# Patient Record
Sex: Female | Born: 1960 | Race: Black or African American | Hispanic: No | Marital: Single | Smoking: Former smoker
Health system: Southern US, Community
[De-identification: ages and names within clinical notes are randomized; demographics above are authoritative.]

## PROBLEM LIST (undated history)

## (undated) DIAGNOSIS — D281 Benign neoplasm of vagina: Secondary | ICD-10-CM

## (undated) DIAGNOSIS — K921 Melena: Secondary | ICD-10-CM

## (undated) DIAGNOSIS — E785 Hyperlipidemia, unspecified: Secondary | ICD-10-CM

## (undated) HISTORY — DX: Hyperlipidemia, unspecified: E78.5

## (undated) HISTORY — DX: Melena: K92.1

## (undated) HISTORY — DX: Benign neoplasm of vagina: D28.1

---

## 2003-11-18 ENCOUNTER — Other Ambulatory Visit: Admission: RE | Admit: 2003-11-18 | Discharge: 2003-11-18 | Payer: Self-pay | Admitting: Gynecology

## 2005-06-21 ENCOUNTER — Ambulatory Visit: Payer: Self-pay | Admitting: Gastroenterology

## 2005-08-22 ENCOUNTER — Ambulatory Visit: Payer: Self-pay | Admitting: Gastroenterology

## 2005-08-28 ENCOUNTER — Ambulatory Visit: Payer: Self-pay | Admitting: Gastroenterology

## 2006-10-09 ENCOUNTER — Other Ambulatory Visit: Admission: RE | Admit: 2006-10-09 | Discharge: 2006-10-09 | Payer: Self-pay | Admitting: Obstetrics and Gynecology

## 2006-11-21 ENCOUNTER — Ambulatory Visit (HOSPITAL_COMMUNITY): Admission: RE | Admit: 2006-11-21 | Discharge: 2006-11-21 | Payer: Self-pay | Admitting: Obstetrics and Gynecology

## 2006-11-21 ENCOUNTER — Encounter (INDEPENDENT_AMBULATORY_CARE_PROVIDER_SITE_OTHER): Payer: Self-pay | Admitting: Specialist

## 2007-02-06 ENCOUNTER — Other Ambulatory Visit: Admission: RE | Admit: 2007-02-06 | Discharge: 2007-02-06 | Payer: Self-pay | Admitting: Obstetrics and Gynecology

## 2007-05-02 ENCOUNTER — Emergency Department (HOSPITAL_COMMUNITY): Admission: EM | Admit: 2007-05-02 | Discharge: 2007-05-02 | Payer: Self-pay | Admitting: Emergency Medicine

## 2007-08-21 ENCOUNTER — Other Ambulatory Visit: Admission: RE | Admit: 2007-08-21 | Discharge: 2007-08-21 | Payer: Self-pay | Admitting: Gynecology

## 2007-09-11 ENCOUNTER — Ambulatory Visit (HOSPITAL_COMMUNITY): Admission: RE | Admit: 2007-09-11 | Discharge: 2007-09-11 | Payer: Self-pay | Admitting: Gynecology

## 2009-07-15 LAB — HM MAMMOGRAPHY

## 2010-08-22 ENCOUNTER — Encounter: Payer: Managed Care, Other (non HMO) | Admitting: Gynecology

## 2010-08-22 ENCOUNTER — Other Ambulatory Visit (HOSPITAL_COMMUNITY)
Admission: RE | Admit: 2010-08-22 | Discharge: 2010-08-22 | Disposition: A | Discharge status: Home / Self Care | Payer: Managed Care, Other (non HMO) | Source: Ambulatory Visit | Attending: Gynecology | Admitting: Gynecology

## 2010-08-22 ENCOUNTER — Other Ambulatory Visit: Payer: Self-pay | Admitting: Gynecology

## 2010-08-22 DIAGNOSIS — Z01419 Encounter for gynecological examination (general) (routine) without abnormal findings: Secondary | ICD-10-CM

## 2010-08-22 DIAGNOSIS — Z124 Encounter for screening for malignant neoplasm of cervix: Secondary | ICD-10-CM | POA: Insufficient documentation

## 2010-08-22 DIAGNOSIS — N912 Amenorrhea, unspecified: Secondary | ICD-10-CM

## 2010-08-24 ENCOUNTER — Other Ambulatory Visit: Payer: Managed Care, Other (non HMO)

## 2010-08-24 ENCOUNTER — Ambulatory Visit: Payer: Managed Care, Other (non HMO) | Admitting: Gynecology

## 2010-08-24 DIAGNOSIS — D259 Leiomyoma of uterus, unspecified: Secondary | ICD-10-CM

## 2010-08-24 DIAGNOSIS — N912 Amenorrhea, unspecified: Secondary | ICD-10-CM

## 2010-08-24 DIAGNOSIS — N949 Unspecified condition associated with female genital organs and menstrual cycle: Secondary | ICD-10-CM

## 2010-11-30 NOTE — Op Note (Signed)
[name], DERSTINE                 ACCOUNT NO.:  [id number]   MEDICAL RECORD NO.:  [id number]          PATIENT TYPE:  AMB   LOCATION:  SDC                           FACILITY:  [facility]   PHYSICIAN:  James A. Ashley Royalty, M.D.DATE OF BIRTH:  [birth year]   DATE OF PROCEDURE:  DATE OF DISCHARGE:                               OPERATIVE REPORT   PREOPERATIVE DIAGNOSIS:  Menorrhagia.   POSTOPERATIVE DIAGNOSIS:  Cervical polyp versus fibroid.   PROCEDURES:  1. Diagnostic/operative hysteroscopy with removal of polyp versus      fibroid.  2. Dilatation and curettage.   SURGEON:  Rudy Jew. Ashley Royalty, M.D.   ANESTHESIA:  General with 1% Xylocaine paracervical block.   SPECIMENS:  Cervical polyp versus fibroid.   ESTIMATED BLOOD LOSS:  50 mL.   DEFICIT:  300 mL with a large amount on the floor.   COMPLICATIONS:  None.   PACKS AND DRAINS:  None.   PROCEDURE:  The patient was taken to the operating room and placed in  the dorsal supine position.  After a general anesthetic was  administered, she was placed in the lithotomy position and prepped and  draped in the usual manner for vaginal surgery.  A posterior weighted  retractor was placed per vagina.  The anterior lip of the cervix was  grasped with a single-tooth tenaculum.  The uterus was gently sounded to  approximately 11 cm.  The cervix was dilated to a size 27 [place] using  [employer] dilators.  The resectoscope was placed into the uterine cavity  using sorbitol as a distention medium.  The endometrial cavity was  thoroughly inspected.  The left and right tubal ostia were seen without  difficulty.  Appropriate photos were obtained.  The endometrial cavity  itself was completely unremarkable.  I could appreciate no submucosal  fibroids or polyps whatsoever.  However, retracting the scope into the  cervix did reveal the presence of an anterior pedunculated polyp versus  fibroid.  The appearance favored that of a polyp.  Using the  resectoscope  and 60 watts of power, cutting waveform, the polyp was  resected from its origin and submitted to pathology for histologic  studies.  Hemostasis was obtained using the coagulation waveform.  Hemostasis was noted.   Attention was then turned to the uterine curettage.  A medium-sized  curette was introduced into the uterine cavity and 4-quadrant curettage  was performed.  Then a therapeutic curettage was performed.  All  curettings were separately submitted to pathology for histologic  studies.   At this point the patient was felt to have benefitted maximally from the  surgical procedure.  Xylocaine 1% paracervical block was placed for  postoperative analgesia.  The vaginal instruments were removed and the  procedure terminated.   The patient tolerated the procedure extremely well and was returned to  the recovery room in good condition.      James A. Ashley Royalty, M.D.  Electronically Signed     JAM/MEDQ  D:  11/21/2006  T:  11/21/2006  Job:  [id number]

## 2010-11-30 NOTE — H&P (Signed)
[name], [name]                 ACCOUNT NO.:  [id number]   MEDICAL RECORD NO.:  [id number]          PATIENT TYPE:  AMB   LOCATION:  SDC                           FACILITY:  [facility]   PHYSICIAN:  James A. Ashley Royalty, M.D.DATE OF BIRTH:  08/09/1960   DATE OF ADMISSION:  11/21/2006  DATE OF DISCHARGE:                              HISTORY & PHYSICAL   A 50 year old nulligravida referred for evaluation of significant anemia  and abnormal uterine bleeding.  The patient stated her periods were  approximately every month lasting 6 days and quite heavy.  She states  she has a history of a fibroid uterus.  Initial pelvic examination  revealed the uterus to be approximately 12 x 8 x 8 cm and no adnexal  masses were palpable.  Subsequent ultrasound was performed and the  uterus was noted to be 12 x 9 x 8 cm with several fibroids present, the  largest of which is 6 cm in greatest diameter.  In addition, there was a  2.5 cm cyst in the left adnexa.  CBC was performed and the hemoglobin  was noted to be 7.8.  The patient expressed a desire to preserve her  childbearing capabilities.  As she has [employer], sonohysterogram  in the office is not possible since it is not covered by her insurance.  She presents for diagnostic/operative hysteroscopy and dilatation and  curettage.   MEDICATIONS:  See intake sheet.   PAST MEDICAL HISTORY:  Medical negative.  Surgical negative.   ALLERGIES:  None.   SOCIAL HISTORY:  The patient denies she uses tobacco or significant  alcohol.   REVIEW OF SYSTEMS:  Not attributable.   PHYSICAL EXAMINATION:  GENERAL:  Well-developed, well-nourished pleasant  female in no acute distress.  Afebrile with vital signs stable.  CHEST:  Lungs are clear.  CARDIAC:  Regular rate and rhythm.  ABDOMEN:  Soft and nontender.  PELVIC:  Please most recent office evaluation.  External genitalia  within normal limits.  Vagina and cervix are without gross lesions.  Bimanual  examination reveals the uterus to be approximately 12 x 8 x 8  cm and no adnexal masses are palpable.   IMPRESSION:  1. Fibroid uterus.  2. Menorrhagia, probably secondary to #1.  3. Iron deficiency anemia, probably secondary to #2.  4. Left adnexal cyst.  Differential includes endometriosis, benign      neoplasm, doubt malignant neoplasm.   PLAN:  1. Diagnostic/operative hysteroscopy.  2. Dilatation and curettage.   Risks, benefits, complications and alternatives were discussed with the  patient.  She states she understands and accepts.  Questions invited and  answered.      James A. Ashley Royalty, M.D.  Electronically Signed     JAM/MEDQ  D:  11/21/2006  T:  11/21/2006  Job:  [id number]

## 2012-06-17 ENCOUNTER — Other Ambulatory Visit: Payer: Self-pay | Admitting: *Deleted

## 2012-06-17 ENCOUNTER — Other Ambulatory Visit (INDEPENDENT_AMBULATORY_CARE_PROVIDER_SITE_OTHER): Payer: Managed Care, Other (non HMO)

## 2012-06-17 ENCOUNTER — Encounter: Payer: Self-pay | Admitting: Internal Medicine

## 2012-06-17 ENCOUNTER — Ambulatory Visit (INDEPENDENT_AMBULATORY_CARE_PROVIDER_SITE_OTHER): Payer: Managed Care, Other (non HMO) | Admitting: Internal Medicine

## 2012-06-17 VITALS — BP 116/82 | HR 65 | Temp 98.0°F | Ht 63.0 in | Wt 181.0 lb

## 2012-06-17 DIAGNOSIS — Z13220 Encounter for screening for lipoid disorders: Secondary | ICD-10-CM

## 2012-06-17 DIAGNOSIS — Z130 Encounter for screening for diseases of the blood and blood-forming organs and certain disorders involving the immune mechanism: Secondary | ICD-10-CM

## 2012-06-17 DIAGNOSIS — Z131 Encounter for screening for diabetes mellitus: Secondary | ICD-10-CM

## 2012-06-17 DIAGNOSIS — Z1231 Encounter for screening mammogram for malignant neoplasm of breast: Secondary | ICD-10-CM

## 2012-06-17 DIAGNOSIS — R5383 Other fatigue: Secondary | ICD-10-CM

## 2012-06-17 DIAGNOSIS — Z23 Encounter for immunization: Secondary | ICD-10-CM

## 2012-06-17 DIAGNOSIS — R5381 Other malaise: Secondary | ICD-10-CM

## 2012-06-17 DIAGNOSIS — Z1321 Encounter for screening for nutritional disorder: Secondary | ICD-10-CM

## 2012-06-17 DIAGNOSIS — Z1329 Encounter for screening for other suspected endocrine disorder: Secondary | ICD-10-CM

## 2012-06-17 DIAGNOSIS — Z0000 Encounter for general adult medical examination without abnormal findings: Secondary | ICD-10-CM

## 2012-06-17 DIAGNOSIS — Z1239 Encounter for other screening for malignant neoplasm of breast: Secondary | ICD-10-CM

## 2012-06-17 LAB — TSH: TSH: 3.77 u[IU]/mL (ref 0.35–5.50)

## 2012-06-17 LAB — BASIC METABOLIC PANEL
BUN: 15 mg/dL (ref 6–23)
CO2: 26 mEq/L (ref 19–32)
GFR: 118.18 mL/min (ref >60.00)
Glucose, Bld: 84 mg/dL (ref 70–99)
Potassium: 4.1 mEq/L (ref 3.5–5.1)

## 2012-06-17 LAB — LIPID PANEL
Cholesterol: 250 mg/dL — ABNORMAL HIGH (ref 0–200)
HDL: 38.1 mg/dL — ABNORMAL LOW (ref >39.00)
Triglycerides: 105 mg/dL (ref 0.0–149.0)

## 2012-06-17 LAB — CBC
MCHC: 33.1 g/dL (ref 30.0–36.0)
MCV: 91.7 fl (ref 78.0–100.0)
RBC: 4.39 Mil/uL (ref 3.87–5.11)
WBC: 6.4 10*3/uL (ref 4.5–10.5)

## 2012-06-17 NOTE — Patient Instructions (Addendum)
Health Maintenance, Females A healthy lifestyle and preventative care can promote health and wellness.  Maintain regular health, dental, and eye exams.  Eat a healthy diet. Foods like vegetables, fruits, whole grains, low-fat dairy products, and lean protein foods contain the nutrients you need without too many calories. Decrease your intake of foods high in solid fats, added sugars, and salt. Get information about a proper diet from your caregiver, if necessary.  Regular physical exercise is one of the most important things you can do for your health. Most adults should get at least 150 minutes of moderate-intensity exercise (any activity that increases your heart rate and causes you to sweat) each week. In addition, most adults need muscle-strengthening exercises on 2 or more days a week.   Maintain a healthy weight. The body mass index (BMI) is a screening tool to identify possible weight problems. It provides an estimate of body fat based on height and weight. Your caregiver can help determine your BMI, and can help you achieve or maintain a healthy weight. For adults 20 years and older:  A BMI below 18.5 is considered underweight.  A BMI of 18.5 to 24.9 is normal.  A BMI of 25 to 29.9 is considered overweight.  A BMI of 30 and above is considered obese.  Maintain normal blood lipids and cholesterol by exercising and minimizing your intake of saturated fat. Eat a balanced diet with plenty of fruits and vegetables. Blood tests for lipids and cholesterol should begin at age 20 and be repeated every 5 years. If your lipid or cholesterol levels are high, you are over 50, or you are a high risk for heart disease, you may need your cholesterol levels checked more frequently.Ongoing high lipid and cholesterol levels should be treated with medicines if diet and exercise are not effective.  If you smoke, find out from your caregiver how to quit. If you do not use tobacco, do not start.  If you  are pregnant, do not drink alcohol. If you are breastfeeding, be very cautious about drinking alcohol. If you are not pregnant and choose to drink alcohol, do not exceed 1 drink per day. One drink is considered to be 12 ounces (355 mL) of beer, 5 ounces (148 mL) of wine, or 1.5 ounces (44 mL) of liquor.  Avoid use of street drugs. Do not share needles with anyone. Ask for help if you need support or instructions about stopping the use of drugs.  High blood pressure causes heart disease and increases the risk of stroke. Blood pressure should be checked at least every 1 to 2 years. Ongoing high blood pressure should be treated with medicines, if weight loss and exercise are not effective.  If you are 55 to 49 years old, ask your caregiver if you should take aspirin to prevent strokes.  Diabetes screening involves taking a blood sample to check your fasting blood sugar level. This should be done once every 3 years, after age 45, if you are within normal weight and without risk factors for diabetes. Testing should be considered at a younger age or be carried out more frequently if you are overweight and have at least 1 risk factor for diabetes.  Breast cancer screening is essential preventative care for women. You should practice "breast self-awareness." This means understanding the normal appearance and feel of your breasts and may include breast self-examination. Any changes detected, no matter how small, should be reported to a caregiver. Women in their 20s and 30s should have   a clinical breast exam (CBE) by a caregiver as part of a regular health exam every 1 to 3 years. After age 40, women should have a CBE every year. Starting at age 40, women should consider having a mammogram (breast X-ray) every year. Women who have a family history of breast cancer should talk to their caregiver about genetic screening. Women at a high risk of breast cancer should talk to their caregiver about having an MRI and a  mammogram every year.  The Pap test is a screening test for cervical cancer. Women should have a Pap test starting at age 21. Between ages 21 and 29, Pap tests should be repeated every 2 years. Beginning at age 30, you should have a Pap test every 3 years as long as the past 3 Pap tests have been normal. If you had a hysterectomy for a problem that was not cancer or a condition that could lead to cancer, then you no longer need Pap tests. If you are between ages 65 and 70, and you have had normal Pap tests going back 10 years, you no longer need Pap tests. If you have had past treatment for cervical cancer or a condition that could lead to cancer, you need Pap tests and screening for cancer for at least 20 years after your treatment. If Pap tests have been discontinued, risk factors (such as a new sexual partner) need to be reassessed to determine if screening should be resumed. Some women have medical problems that increase the chance of getting cervical cancer. In these cases, your caregiver may recommend more frequent screening and Pap tests.  The human papillomavirus (HPV) test is an additional test that may be used for cervical cancer screening. The HPV test looks for the virus that can cause the cell changes on the cervix. The cells collected during the Pap test can be tested for HPV. The HPV test could be used to screen women aged 30 years and older, and should be used in women of any age who have unclear Pap test results. After the age of 30, women should have HPV testing at the same frequency as a Pap test.  Colorectal cancer can be detected and often prevented. Most routine colorectal cancer screening begins at the age of 50 and continues through age 75. However, your caregiver may recommend screening at an earlier age if you have risk factors for colon cancer. On a yearly basis, your caregiver may provide home test kits to check for hidden blood in the stool. Use of a small camera at the end of a  tube, to directly examine the colon (sigmoidoscopy or colonoscopy), can detect the earliest forms of colorectal cancer. Talk to your caregiver about this at age 50, when routine screening begins. Direct examination of the colon should be repeated every 5 to 10 years through age 75, unless early forms of pre-cancerous polyps or small growths are found.  Hepatitis C blood testing is recommended for all people born from 1945 through 1965 and any individual with known risks for hepatitis C.  Practice safe sex. Use condoms and avoid high-risk sexual practices to reduce the spread of sexually transmitted infections (STIs). Sexually active women aged 25 and younger should be checked for Chlamydia, which is a common sexually transmitted infection. Older women with new or multiple partners should also be tested for Chlamydia. Testing for other STIs is recommended if you are sexually active and at increased risk.  Osteoporosis is a disease in which the   bones lose minerals and strength with aging. This can result in serious bone fractures. The risk of osteoporosis can be identified using a bone density scan. Women ages 65 and over and women at risk for fractures or osteoporosis should discuss screening with their caregivers. Ask your caregiver whether you should be taking a calcium supplement or vitamin D to reduce the rate of osteoporosis.  Menopause can be associated with physical symptoms and risks. Hormone replacement therapy is available to decrease symptoms and risks. You should talk to your caregiver about whether hormone replacement therapy is right for you.  Use sunscreen with a sun protection factor (SPF) of 30 or greater. Apply sunscreen liberally and repeatedly throughout the day. You should seek shade when your shadow is shorter than you. Protect yourself by wearing long sleeves, pants, a wide-brimmed hat, and sunglasses year round, whenever you are outdoors.  Notify your caregiver of new moles or  changes in moles, especially if there is a change in shape or color. Also notify your caregiver if a mole is larger than the size of a pencil eraser.  Stay current with your immunizations. Document Released: 01/14/2011 Document Revised: 09/23/2011 Document Reviewed: 01/14/2011 ExitCare Patient Information 2013 [employer]. Breast Self-Exam A self breast exam may help you find changes or problems while they are still small. Do a breast self-exam:  Every month.  One week after your period (menstrual period).  On the first day of each month if you do not have periods anymore. Look for any:  Change in breast color, size, or shape.  Dimples in your breast.  Changes in your nipples or skin.  Dry skin on your breasts or nipples.  Watery or bloody discharge from your nipples.  Feel for:  Lumps.  Thick, hard places.  Any other changes. HOME CARE There are 3 ways to do the breast self-exam: In front of a mirror.  Lift your arms over your head and turn side to side.  Put your hands on your hips and lean down, then turn from side to side.  Bend forward and turn from side to side. In the shower.  With soapy hands, check both breasts. Then check above and below your collarbone and your armpits.  Feel above and below your collarbone down to under your breast, and from the center of your chest to the outer edge of the armpit. Check for any lumps or hard spots.  Using the tips of your middle three fingers check your whole breast by pressing your hand over your breast in a circle or in an up and down motion. Lying down.  Lie flat on your bed.  Put a small pillow under the breast you are going to check. On that same side, put your hand behind your head.  With your other hand, use the 3 middle fingers to feel the breast.  Move your fingers in a circle around the breast. Press firmly over all parts of the breast to feel for any lumps. GET HELP RIGHT AWAY IF: You find any  changes in your breasts so they can be checked. Document Released: 12/18/2007 Document Revised: 09/23/2011 Document Reviewed: 10/19/2008 ExitCare Patient Information 2013 [employer].  

## 2012-06-17 NOTE — Addendum Note (Signed)
Addended by: Brenton Grills C on: 06/17/2012 11:23 AM   Modules accepted: Orders

## 2012-06-17 NOTE — Progress Notes (Signed)
HPI  Pt present to the clinic today to establish care. She has no concerns.  Past Medical History  Diagnosis Date  . Hyperlipidemia   . Blood in stool   . Vaginal fibroids     Current Outpatient Prescriptions  Medication Sig Dispense Refill  . tobramycin-dexamethasone (TOBRADEX) ophthalmic solution         No Known Allergies  Family History  Problem Relation Age of Onset  . Diabetes Father   . Hypertension Father   . Heart disease Father   . Hyperlipidemia Father   . Stroke Father   . Cancer Neg Hx     History   Social History  . Marital Status: Single    Spouse Name: N/A    Number of Children: N/A  . Years of Education: 12   Occupational History  .     Social History Main Topics  . Smoking status: Former Games developer  . Smokeless tobacco: Not on file  . Alcohol Use: No  . Drug Use: No  . Sexually Active: Yes    Birth Control/ Protection: Condom   Other Topics Concern  . Not on file   Social History Narrative   Regular exercise-yesCaffeine Use-yes    ROS:  Constitutional: Pt reports fatigue. Denies fever, malaise,headache or abrupt weight changes.  HEENT: Denies eye pain, eye redness, ear pain, ringing in the ears, wax buildup, runny nose, nasal congestion, bloody nose, or sore throat. Respiratory: Denies difficulty breathing, shortness of breath, cough or sputum production.   Cardiovascular: Denies chest pain, chest tightness, palpitations or swelling in the hands or feet.  Gastrointestinal: Denies abdominal pain, bloating, constipation, diarrhea or blood in the stool.  GU: Denies frequency, urgency, pain with urination, blood in urine, odor or discharge. Musculoskeletal: Denies decrease in range of motion, difficulty with gait, muscle pain or joint pain and swelling.  Skin: Denies redness, rashes, lesions or ulcercations.  Neurological: Denies dizziness, difficulty with memory, difficulty with speech or problems with balance and coordination.   No other  specific complaints in a complete review of systems (except as listed in HPI above).  PE:  BP 116/82  Pulse 65  Temp 98 F (36.7 C) (Oral)  Ht 5\' 3"  (1.6 m)  Wt 181 lb (82.101 kg)  BMI 32.06 kg/m2  SpO2 95%  LMP 07/15/2009 Wt Readings from Last 3 Encounters:  06/17/12 181 lb (82.101 kg)    General: Appears her stated age, overweight but well developed, well nourished in NAD. HEENT: Head: normal shape and size; Eyes: sclera white, no icterus, conjunctiva pink, PERRLA and EOMs intact; Ears: Tm's gray and intact, normal light reflex; Nose: mucosa pink and moist, septum midline; Throat/Mouth: Teeth present, mucosa pink and moist, no lesions or ulcerations noted.  Neck: Normal range of motion. Neck supple, trachea midline. No massses, lumps or thyromegaly present.  Cardiovascular: Normal rate and rhythm. S1,S2 noted.  No murmur, rubs or gallops noted. No JVD or BLE edema. No carotid bruits noted. Pulmonary/Chest: Normal effort and positive vesicular breath sounds. No respiratory distress. No wheezes, rales or ronchi noted.  Abdomen: Soft and nontender. Normal bowel sounds, no bruits noted. No distention or masses noted. Liver, spleen and kidneys non palpable. Musculoskeletal: Normal range of motion. No signs of joint swelling. No difficulty with gait.  Neurological: Alert and oriented. Cranial nerves II-XII intact. Coordination normal. +DTRs bilaterally. Psychiatric: Mood and affect normal. Behavior is normal. Judgment and thought content normal.   Assessment and Plan:  Preventative Health Maintenance:  Will  obtain basic screening labs today: CBC, BMET, TSH, lipids and vit D Will order mammogram Will get a tdap today Pt declines flu vaccine  RTC in 1 year or sooner if needed.

## 2012-06-18 ENCOUNTER — Telehealth: Payer: Self-pay | Admitting: *Deleted

## 2012-06-18 ENCOUNTER — Other Ambulatory Visit: Payer: Self-pay | Admitting: Internal Medicine

## 2012-06-18 ENCOUNTER — Encounter: Payer: Self-pay | Admitting: Internal Medicine

## 2012-06-18 DIAGNOSIS — E559 Vitamin D deficiency, unspecified: Secondary | ICD-10-CM

## 2012-06-18 MED ORDER — ERGOCALCIFEROL 1.25 MG (50000 UT) PO CAPS: 50000.0000 [IU] | ORAL_CAPSULE | ORAL | Status: DC

## 2012-06-18 NOTE — Telephone Encounter (Signed)
Pt informed of lab results and of NP's advisement.   Ash,  Can you please send this and also give Ms. Wisser a call and let her know that:  Your complete blood count is essentially normal.  Your basic metabolic panel, a lab that looks at your electrolytes as well as your kidney function, are all essentially normal.  Your cholesterol is very elevated. I suggest 3 months of lifestyle changes, including diet and exercise changes. I would avoid fast food and fried foods and try to e xercise for at least 30 minutes 4 days out of the week.  Your TSH, a measure of your thyroid function, is essentially normal  Your vitamin D level came back low. The normal range is between 30-100. I have called in a prescription for Ergocalciferol 50,000 units to be taken once a week for the next 12 weeks. When you complete this, I ask that you come back to the office for a follow-up of your vitamin D level. Thx!  Rene Kocher

## 2012-06-18 NOTE — Progress Notes (Signed)
Vit d level low. Order for ergocalciferol placed

## 2012-07-14 ENCOUNTER — Ambulatory Visit (HOSPITAL_COMMUNITY): Admission: RE | Admit: 2012-07-14 | Payer: Managed Care, Other (non HMO) | Source: Ambulatory Visit

## 2013-06-21 ENCOUNTER — Ambulatory Visit (INDEPENDENT_AMBULATORY_CARE_PROVIDER_SITE_OTHER): Payer: Managed Care, Other (non HMO) | Admitting: Nurse Practitioner

## 2013-06-21 ENCOUNTER — Telehealth: Payer: Self-pay | Admitting: *Deleted

## 2013-06-21 ENCOUNTER — Encounter: Payer: Self-pay | Admitting: Nurse Practitioner

## 2013-06-21 ENCOUNTER — Ambulatory Visit: Payer: Managed Care, Other (non HMO)

## 2013-06-21 VITALS — BP 130/80 | HR 85 | Temp 98.6°F | Ht 63.0 in | Wt 170.0 lb

## 2013-06-21 DIAGNOSIS — J069 Acute upper respiratory infection, unspecified: Secondary | ICD-10-CM

## 2013-06-21 DIAGNOSIS — N390 Urinary tract infection, site not specified: Secondary | ICD-10-CM

## 2013-06-21 DIAGNOSIS — R109 Unspecified abdominal pain: Secondary | ICD-10-CM

## 2013-06-21 LAB — POCT URINALYSIS DIPSTICK
Ketones, UA: NEGATIVE
Spec Grav, UA: 1.015
Urobilinogen, UA: 0.2
pH, UA: 6.5

## 2013-06-21 MED ORDER — CIPROFLOXACIN HCL 250 MG PO TABS: 250.0000 mg | ORAL_TABLET | Freq: Two times a day (BID) | ORAL | Status: DC

## 2013-06-21 MED ORDER — PHENAZOPYRIDINE HCL 200 MG PO TABS: 200.0000 mg | ORAL_TABLET | Freq: Three times a day (TID) | ORAL | Status: DC | PRN

## 2013-06-21 MED ORDER — NITROFURANTOIN MONOHYD MACRO 100 MG PO CAPS: 100.0000 mg | ORAL_CAPSULE | Freq: Two times a day (BID) | ORAL | Status: DC

## 2013-06-21 NOTE — Progress Notes (Signed)
   Subjective:    Patient ID: [name], female    DOB: [birth year], 52 y.o.   MRN: [id number]  Flank Pain This is a new problem. The current episode started in the past 7 days (3da). The problem occurs constantly. Pain location: R side. The quality of the pain is described as aching. The pain does not radiate. The pain is mild. Associated symptoms include abdominal pain. Pertinent negatives include no bladder incontinence, bowel incontinence, chest pain, dysuria, fever, headaches, leg pain, pelvic pain or weakness. (Malodorous urine. Reports URI for several days, getting better. ) She has tried nothing for the symptoms.      Review of Systems  Constitutional: Negative for fever, chills and fatigue.  HENT: Positive for congestion. Negative for ear pain.   Eyes: Negative for redness.  Respiratory: Positive for cough. Negative for chest tightness, shortness of breath and wheezing.   Cardiovascular: Negative for chest pain.  Gastrointestinal: Positive for abdominal pain and constipation (has bm every 3-4 days). Negative for nausea, vomiting, diarrhea, abdominal distention and bowel incontinence.  Genitourinary: Positive for flank pain (Right) and difficulty urinating (when bladder is overdistended). Negative for bladder incontinence, dysuria, urgency, menstrual problem (menopausal for 4 years) and pelvic pain.  Neurological: Negative for weakness and headaches.  Hematological: Negative for adenopathy.       Objective:   Physical Exam  Vitals reviewed. Constitutional: She is oriented to person, place, and time. She appears well-developed and well-nourished. No distress.  HENT:  Head: Normocephalic and atraumatic.  Right Ear: External ear normal. Tympanic membrane is not retracted and not bulging. A middle ear effusion is present.  Left Ear: External ear normal. Tympanic membrane is not retracted and not bulging.  No middle ear effusion.  Wearing upper denture  Eyes: Right eye exhibits  no discharge. Left eye exhibits no discharge.  Sclera injected  Neck: Normal range of motion. Neck supple. No thyromegaly present.  Cardiovascular: Normal rate, regular rhythm and normal heart sounds.   No murmur heard. Pulmonary/Chest: Effort normal and breath sounds normal. No respiratory distress. She has no wheezes.  Abdominal: Soft. Bowel sounds are normal. She exhibits no distension and no mass. There is no tenderness. There is no rebound and no guarding.  Musculoskeletal: She exhibits tenderness.  R-sided CVA tenderness  Lymphadenopathy:    She has no cervical adenopathy.  Neurological: She is alert and oriented to person, place, and time.  Skin: Skin is warm and dry.  Psychiatric: She has a normal mood and affect. Her behavior is normal. Thought content normal.          Assessment & Plan:  1. Side pain, right - POCT urinalysis dipstick= pos blood, protein, leuks - Urine culture - Urinalysis, Routine w reflex microscopic; Future  2. Urinary tract infection  - phenazopyridine (PYRIDIUM) 200 MG tablet; Take 1 tablet (200 mg total) by mouth 3 (three) times daily as needed for pain.  Dispense: 10 tablet; Refill: 0 - Urinalysis, Routine w reflex microscopic; Future - ciprofloxacin (CIPRO) 250 MG tablet; Take 1 tablet (250 mg total) by mouth 2 (two) times daily.  Dispense: 6 tablet; Refill: 0  3. URI See pt instructions.

## 2013-06-21 NOTE — Patient Instructions (Addendum)
I am treating you for a urinary tract infection. Please start antibiotic. Also start drinking club soda and cranberry juice to stay hydrated-sip through out the day for 3-4 days. Avoid dark sodas. Use yridium to relax bladder, this may help with pain in side. Please let us know if pain in side does not improve in 3-4 days, or sooner if you feel worse. Please follow up in 1 month for re-check of urine, as you have protein & blood in urine today. For fluid in ears, take 30 mg pseudoephedrine twice daily for a few days. Pleasure to meet you!  Urinary Tract Infection Urinary tract infections (UTIs) can develop anywhere along your urinary tract. Your urinary tract is your body's drainage system for removing wastes and extra water. Your urinary tract includes two kidneys, two ureters, a bladder, and a urethra. Your kidneys are a pair of bean-shaped organs. Each kidney is about the size of your fist. They are located below your ribs, one on each side of your spine. CAUSES Infections are caused by microbes, which are microscopic organisms, including fungi, viruses, and bacteria. These organisms are so small that they can only be seen through a microscope. Bacteria are the microbes that most commonly cause UTIs. SYMPTOMS  Symptoms of UTIs may vary by age and gender of the patient and by the location of the infection. Symptoms in young women typically include a frequent and intense urge to urinate and a painful, burning feeling in the bladder or urethra during urination. Older women and men are more likely to be tired, shaky, and weak and have muscle aches and abdominal pain. A fever may mean the infection is in your kidneys. Other symptoms of a kidney infection include pain in your back or sides below the ribs, nausea, and vomiting. DIAGNOSIS To diagnose a UTI, your caregiver will ask you about your symptoms. Your caregiver also will ask to provide a urine sample. The urine sample will be tested for bacteria and  white blood cells. White blood cells are made by your body to help fight infection. TREATMENT  Typically, UTIs can be treated with medication. Because most UTIs are caused by a bacterial infection, they usually can be treated with the use of antibiotics. The choice of antibiotic and length of treatment depend on your symptoms and the type of bacteria causing your infection. HOME CARE INSTRUCTIONS  If you were prescribed antibiotics, take them exactly as your caregiver instructs you. Finish the medication even if you feel better after you have only taken some of the medication.  Drink enough water and fluids to keep your urine clear or pale yellow.  Avoid caffeine, tea, and carbonated beverages. They tend to irritate your bladder.  Empty your bladder often. Avoid holding urine for long periods of time.  Empty your bladder before and after sexual intercourse.  After a bowel movement, women should cleanse from front to back. Use each tissue only once. SEEK MEDICAL CARE IF:   You have back pain.  You develop a fever.  Your symptoms do not begin to resolve within 3 days. SEEK IMMEDIATE MEDICAL CARE IF:   You have severe back pain or lower abdominal pain.  You develop chills.  You have nausea or vomiting.  You have continued burning or discomfort with urination. MAKE SURE YOU:   Understand these instructions.  Will watch your condition.  Will get help right away if you are not doing well or get worse. Document Released: 04/10/2005 Document Revised: 12/31/2011 Document Reviewed:  08/09/2011 ExitCare Patient Information 2014 [place], [place].

## 2013-06-21 NOTE — Telephone Encounter (Signed)
Called patient to find out what problem she has with macrobid. Patient stated that she had headaches from med. Patient given cipro instead.

## 2013-06-21 NOTE — Progress Notes (Signed)
Pre-visit discussion using our clinic review tool. No additional management support is needed unless otherwise documented below in the visit note.  

## 2013-06-22 LAB — URINALYSIS, MICROSCOPIC ONLY
Crystals: NONE SEEN
Squamous Epithelial / LPF: NONE SEEN

## 2013-06-22 LAB — URINALYSIS, ROUTINE W REFLEX MICROSCOPIC
Ketones, ur: NEGATIVE mg/dL
Nitrite: NEGATIVE
pH: 6 (ref 5.0–8.0)

## 2015-06-19 ENCOUNTER — Other Ambulatory Visit: Payer: Self-pay | Admitting: Internal Medicine

## 2015-06-19 DIAGNOSIS — Z1231 Encounter for screening mammogram for malignant neoplasm of breast: Secondary | ICD-10-CM

## 2016-03-08 ENCOUNTER — Other Ambulatory Visit: Payer: Self-pay | Admitting: Internal Medicine

## 2016-03-08 DIAGNOSIS — R928 Other abnormal and inconclusive findings on diagnostic imaging of breast: Secondary | ICD-10-CM

## 2016-03-13 ENCOUNTER — Other Ambulatory Visit: Payer: Managed Care, Other (non HMO)

## 2016-03-22 ENCOUNTER — Ambulatory Visit
Admission: RE | Admit: 2016-03-22 | Discharge: 2016-03-22 | Disposition: A | Discharge status: Home / Self Care | Payer: BLUE CROSS/BLUE SHIELD | Source: Ambulatory Visit | Attending: Internal Medicine | Admitting: Internal Medicine

## 2016-03-22 DIAGNOSIS — R928 Other abnormal and inconclusive findings on diagnostic imaging of breast: Secondary | ICD-10-CM

## 2019-10-22 ENCOUNTER — Ambulatory Visit: Payer: Self-pay | Attending: Internal Medicine

## 2019-10-22 DIAGNOSIS — Z23 Encounter for immunization: Secondary | ICD-10-CM

## 2019-10-22 NOTE — Progress Notes (Signed)
   Covid-19 Vaccination Clinic  Name:  [name]    MRN: [id number] DOB: [birth year]  10/22/2019  [name] was observed post Covid-19 immunization for 15 minutes without incident. She was provided with Vaccine Information Sheet and instruction to access the V-Safe system.   [name] was instructed to call 911 with any severe reactions post vaccine: Marland Kitchen Difficulty breathing  . Swelling of face and throat  . A fast heartbeat  . A bad rash all over body  . Dizziness and weakness   Immunizations Administered    Name Date Dose VIS Date Route   Pfizer COVID-19 Vaccine 10/22/2019  1:57 PM 0.3 mL 06/25/2019 Intramuscular   Manufacturer: [employer], [employer]   Lot: [id number]   [place]: [id number]

## 2019-11-16 ENCOUNTER — Ambulatory Visit: Payer: Self-pay | Attending: Internal Medicine

## 2019-11-16 DIAGNOSIS — Z23 Encounter for immunization: Secondary | ICD-10-CM

## 2019-11-16 NOTE — Progress Notes (Signed)
   Covid-19 Vaccination Clinic  Name:  [name]    MRN: [id number] DOB: Jun 18, 1961  11/16/2019  [name] was observed post Covid-19 immunization for 15 minutes without incident. She was provided with Vaccine Information Sheet and instruction to access the V-Safe system.   [name] was instructed to call 911 with any severe reactions post vaccine: Marland Kitchen Difficulty breathing  . Swelling of face and throat  . A fast heartbeat  . A bad rash all over body  . Dizziness and weakness   Immunizations Administered    Name Date Dose VIS Date Route   Pfizer COVID-19 Vaccine 11/16/2019  8:56 AM 0.3 mL 09/08/2018 Intramuscular   Manufacturer: [place]   Lot: [id number]   [place]: [id number]

## 2020-06-28 ENCOUNTER — Other Ambulatory Visit: Payer: Self-pay | Admitting: Internal Medicine

## 2020-06-28 DIAGNOSIS — Z1231 Encounter for screening mammogram for malignant neoplasm of breast: Secondary | ICD-10-CM

## 2020-08-09 ENCOUNTER — Other Ambulatory Visit: Payer: Self-pay

## 2020-08-09 ENCOUNTER — Ambulatory Visit
Admission: RE | Admit: 2020-08-09 | Discharge: 2020-08-09 | Disposition: A | Discharge status: Home / Self Care | Payer: 59 | Source: Ambulatory Visit | Attending: Internal Medicine | Admitting: Internal Medicine

## 2020-08-09 DIAGNOSIS — Z1231 Encounter for screening mammogram for malignant neoplasm of breast: Secondary | ICD-10-CM

## 2020-08-10 ENCOUNTER — Other Ambulatory Visit: Payer: Self-pay | Admitting: Internal Medicine

## 2020-08-10 DIAGNOSIS — R928 Other abnormal and inconclusive findings on diagnostic imaging of breast: Secondary | ICD-10-CM

## 2020-08-11 ENCOUNTER — Ambulatory Visit: Payer: 59

## 2020-08-11 ENCOUNTER — Other Ambulatory Visit: Payer: Self-pay

## 2020-08-11 ENCOUNTER — Ambulatory Visit
Admission: RE | Admit: 2020-08-11 | Discharge: 2020-08-11 | Disposition: A | Discharge status: Home / Self Care | Payer: 59 | Source: Ambulatory Visit | Attending: Internal Medicine | Admitting: Internal Medicine

## 2020-08-11 DIAGNOSIS — R928 Other abnormal and inconclusive findings on diagnostic imaging of breast: Secondary | ICD-10-CM

## 2021-07-28 ENCOUNTER — Ambulatory Visit (HOSPITAL_COMMUNITY)
Admission: EM | Admit: 2021-07-28 | Discharge: 2021-07-28 | Disposition: A | Discharge status: Home / Self Care | Payer: 59 | Attending: Physician Assistant | Admitting: Physician Assistant

## 2021-07-28 ENCOUNTER — Inpatient Hospital Stay (HOSPITAL_BASED_OUTPATIENT_CLINIC_OR_DEPARTMENT_OTHER)
Admission: EM | Admit: 2021-07-28 | Discharge: 2021-08-03 | DRG: 064 | Disposition: A | Discharge status: Rehabilitation Facility | Payer: 59 | Attending: Neurology | Admitting: Neurology

## 2021-07-28 ENCOUNTER — Encounter (HOSPITAL_COMMUNITY): Payer: Self-pay

## 2021-07-28 ENCOUNTER — Encounter (HOSPITAL_BASED_OUTPATIENT_CLINIC_OR_DEPARTMENT_OTHER): Payer: Self-pay

## 2021-07-28 ENCOUNTER — Other Ambulatory Visit: Payer: Self-pay

## 2021-07-28 DIAGNOSIS — R260 Ataxic gait: Secondary | ICD-10-CM | POA: Diagnosis not present

## 2021-07-28 DIAGNOSIS — Z8249 Family history of ischemic heart disease and other diseases of the circulatory system: Secondary | ICD-10-CM

## 2021-07-28 DIAGNOSIS — R509 Fever, unspecified: Secondary | ICD-10-CM

## 2021-07-28 DIAGNOSIS — Z880 Allergy status to penicillin: Secondary | ICD-10-CM

## 2021-07-28 DIAGNOSIS — Z833 Family history of diabetes mellitus: Secondary | ICD-10-CM

## 2021-07-28 DIAGNOSIS — I63441 Cerebral infarction due to embolism of right cerebellar artery: Principal | ICD-10-CM | POA: Diagnosis present

## 2021-07-28 DIAGNOSIS — Z20822 Contact with and (suspected) exposure to covid-19: Secondary | ICD-10-CM | POA: Diagnosis present

## 2021-07-28 DIAGNOSIS — R112 Nausea with vomiting, unspecified: Secondary | ICD-10-CM | POA: Diagnosis not present

## 2021-07-28 DIAGNOSIS — R29700 NIHSS score 0: Secondary | ICD-10-CM | POA: Diagnosis present

## 2021-07-28 DIAGNOSIS — E785 Hyperlipidemia, unspecified: Secondary | ICD-10-CM | POA: Diagnosis present

## 2021-07-28 DIAGNOSIS — R7303 Prediabetes: Secondary | ICD-10-CM | POA: Diagnosis present

## 2021-07-28 DIAGNOSIS — R42 Dizziness and giddiness: Secondary | ICD-10-CM

## 2021-07-28 DIAGNOSIS — J069 Acute upper respiratory infection, unspecified: Secondary | ICD-10-CM

## 2021-07-28 DIAGNOSIS — Z823 Family history of stroke: Secondary | ICD-10-CM

## 2021-07-28 DIAGNOSIS — R519 Headache, unspecified: Secondary | ICD-10-CM | POA: Diagnosis not present

## 2021-07-28 DIAGNOSIS — I639 Cerebral infarction, unspecified: Secondary | ICD-10-CM | POA: Diagnosis present

## 2021-07-28 DIAGNOSIS — Z83438 Family history of other disorder of lipoprotein metabolism and other lipidemia: Secondary | ICD-10-CM

## 2021-07-28 DIAGNOSIS — F17210 Nicotine dependence, cigarettes, uncomplicated: Secondary | ICD-10-CM | POA: Diagnosis present

## 2021-07-28 DIAGNOSIS — G936 Cerebral edema: Secondary | ICD-10-CM | POA: Diagnosis present

## 2021-07-28 DIAGNOSIS — G935 Compression of brain: Secondary | ICD-10-CM | POA: Diagnosis present

## 2021-07-28 LAB — POC INFLUENZA A AND B ANTIGEN (URGENT CARE ONLY)
INFLUENZA A ANTIGEN, POC: NEGATIVE
INFLUENZA B ANTIGEN, POC: NEGATIVE

## 2021-07-28 LAB — URINALYSIS, ROUTINE W REFLEX MICROSCOPIC
Bilirubin Urine: NEGATIVE
Glucose, UA: NEGATIVE mg/dL
Hgb urine dipstick: NEGATIVE
Ketones, ur: NEGATIVE mg/dL
Leukocytes,Ua: NEGATIVE
Nitrite: NEGATIVE
Specific Gravity, Urine: 1.022 (ref 1.005–1.030)
pH: 6 (ref 5.0–8.0)

## 2021-07-28 LAB — CBC WITH DIFFERENTIAL/PLATELET
Abs Immature Granulocytes: 0.02 10*3/uL (ref 0.00–0.07)
Basophils Absolute: 0 10*3/uL (ref 0.0–0.1)
Basophils Relative: 0 %
Eosinophils Absolute: 0 10*3/uL (ref 0.0–0.5)
Eosinophils Relative: 0 %
HCT: 42.5 % (ref 36.0–46.0)
Hemoglobin: 14 g/dL (ref 12.0–15.0)
Immature Granulocytes: 0 %
Lymphocytes Relative: 16 %
Lymphs Abs: 1.5 10*3/uL (ref 0.7–4.0)
MCH: 29.6 pg (ref 26.0–34.0)
MCHC: 32.9 g/dL (ref 30.0–36.0)
MCV: 89.9 fL (ref 80.0–100.0)
Monocytes Absolute: 0.3 10*3/uL (ref 0.1–1.0)
Monocytes Relative: 4 %
Neutro Abs: 7.2 10*3/uL (ref 1.7–7.7)
Neutrophils Relative %: 80 %
Platelets: 206 10*3/uL (ref 150–400)
RBC: 4.73 MIL/uL (ref 3.87–5.11)
RDW: 14.6 % (ref 11.5–15.5)
WBC: 9 10*3/uL (ref 4.0–10.5)
nRBC: 0 % (ref 0.0–0.2)

## 2021-07-28 LAB — RESP PANEL BY RT-PCR (FLU A&B, COVID) ARPGX2
Influenza A by PCR: NEGATIVE
Influenza B by PCR: NEGATIVE
SARS Coronavirus 2 by RT PCR: NEGATIVE

## 2021-07-28 MED ORDER — ONDANSETRON HCL 4 MG/2ML IJ SOLN
4.0000 mg | Freq: Once | INTRAMUSCULAR | Status: AC
  Administered 2021-07-28: 4 mg via INTRAVENOUS
  Filled 2021-07-28: qty 2

## 2021-07-28 MED ORDER — ONDANSETRON 4 MG PO TBDP: 4.0000 mg | ORAL_TABLET | Freq: Once | ORAL | Status: DC

## 2021-07-28 MED ORDER — ONDANSETRON HCL 4 MG/2ML IJ SOLN
4.0000 mg | Freq: Once | INTRAMUSCULAR | Status: AC
  Administered 2021-07-28: 4 mg via INTRAMUSCULAR

## 2021-07-28 MED ORDER — ONDANSETRON 4 MG PO TBDP: 4.0000 mg | ORAL_TABLET | Freq: Three times a day (TID) | ORAL | 0 refills | Status: DC | PRN

## 2021-07-28 MED ORDER — ONDANSETRON 4 MG PO TBDP
ORAL_TABLET | ORAL | Status: AC
  Filled 2021-07-28: qty 1

## 2021-07-28 MED ORDER — KETOROLAC TROMETHAMINE 30 MG/ML IJ SOLN
30.0000 mg | Freq: Once | INTRAMUSCULAR | Status: AC
  Administered 2021-07-28: 30 mg via INTRAVENOUS
  Filled 2021-07-28: qty 1

## 2021-07-28 MED ORDER — SODIUM CHLORIDE 0.9 % IV BOLUS
1000.0000 mL | Freq: Once | INTRAVENOUS | Status: AC
  Administered 2021-07-28: 1000 mL via INTRAVENOUS

## 2021-07-28 MED ORDER — ONDANSETRON HCL 4 MG/2ML IJ SOLN
INTRAMUSCULAR | Status: AC
  Filled 2021-07-28: qty 2

## 2021-07-28 NOTE — ED Provider Notes (Addendum)
[facility]    CSN: [id number] Arrival date & time: 07/28/21  1617      History   Chief Complaint Chief Complaint  Patient presents with   Emesis   Fever    HPI [name] is a 61 y.o. female.   Patient presents today with a 1 day history of URI symptoms.  Reports severe headache, nausea/vomiting interfering with oral intake, fever, nasal congestion.  Denies any cough, chest pain, shortness of breath, body aches.  Denies any known sick contacts.  She has not had COVID in the past but has had COVID vaccines.  She denies any recent antibiotic use.  She did try BC powder this provided no relief of symptoms.  She feels weak and is having difficulty with daily activities as a result of symptoms.  She denies any significant past medical history including asthma, allergies, COPD, diabetes.  She is a former smoker but quit many years ago.  She denies any suspicious food intake, recent travel, medication changes.   Past Medical History:  Diagnosis Date   Blood in stool    Hyperlipidemia    Vaginal fibroids     There are no problems to display for this patient.   History reviewed. No pertinent surgical history.  OB History   No obstetric history on file.      Home Medications    Prior to Admission medications   Medication Sig Start Date End Date Taking? Authorizing Provider  ondansetron (ZOFRAN-ODT) 4 MG disintegrating tablet Take 1 tablet (4 mg total) by mouth every 8 (eight) hours as needed for nausea or vomiting. 07/28/21  Yes Naesha Buckalew K, PA-C  ciprofloxacin (CIPRO) 250 MG tablet Take 1 tablet (250 mg total) by mouth 2 (two) times daily. 06/21/13   Irene Pap, NP  phenazopyridine (PYRIDIUM) 200 MG tablet Take 1 tablet (200 mg total) by mouth 3 (three) times daily as needed for pain. 06/21/13   Irene Pap, NP    Family History Family History  Problem Relation Age of Onset   Diabetes Father    Hypertension Father    Heart disease Father     Hyperlipidemia Father    Stroke Father    Cancer Neg Hx     Social History Social History   Tobacco Use   Smoking status: Former  Substance Use Topics   Alcohol use: No   Drug use: No     Allergies   Patient has no known allergies.   Review of Systems Review of Systems  Constitutional:  Positive for activity change and fatigue. Negative for appetite change and fever.  HENT:  Positive for congestion. Negative for sinus pressure, sneezing and sore throat.   Respiratory:  Negative for cough and shortness of breath.   Cardiovascular:  Negative for chest pain.  Gastrointestinal:  Positive for nausea and vomiting. Negative for abdominal pain and diarrhea.  Musculoskeletal:  Negative for arthralgias and myalgias.  Neurological:  Positive for headaches. Negative for dizziness and light-headedness.    Physical Exam Triage Vital Signs ED Triage Vitals  Enc Vitals Group     BP 07/28/21 1620 124/65     Pulse Rate 07/28/21 1620 63     Resp 07/28/21 1620 20     Temp --      Temp src --      SpO2 07/28/21 1620 100 %     Weight --      Height --      Head Circumference --  Peak Flow --      Pain Score 07/28/21 1622 4     Pain Loc --      Pain Edu? --      Excl. in [place]? --    No data found.  Updated Vital Signs BP 124/65 (BP Location: Right Arm)    Pulse 63    Temp 98.1 F (36.7 C) (Oral)    Resp 20    LMP 07/15/2009    SpO2 100%   Visual Acuity Right Eye Distance:   Left Eye Distance:   Bilateral Distance:    Right Eye Near:   Left Eye Near:    Bilateral Near:     Physical Exam Vitals reviewed.  Constitutional:      General: She is awake. She is not in acute distress.    Appearance: Normal appearance. She is not ill-appearing.  HENT:     Head: Normocephalic and atraumatic.     Right Ear: External ear normal.     Left Ear: External ear normal.     Nose:     Right Sinus: No maxillary sinus tenderness or frontal sinus tenderness.     Left Sinus: No  maxillary sinus tenderness or frontal sinus tenderness.     Mouth/Throat:     Pharynx: Uvula midline. Posterior oropharyngeal erythema present. No oropharyngeal exudate.  Cardiovascular:     Rate and Rhythm: Normal rate and regular rhythm.     Heart sounds: Normal heart sounds, S1 normal and S2 normal. No murmur heard. Pulmonary:     Effort: Pulmonary effort is normal.     Breath sounds: Normal breath sounds. No wheezing, rhonchi or rales.     Comments: Clear to auscultation bilaterally Psychiatric:        Behavior: Behavior is cooperative.     UC Treatments / Results  Labs (all labs ordered are listed, but only abnormal results are displayed) Labs Reviewed  SARS CORONAVIRUS 2 (TAT 6-24 HRS)  CBC WITH DIFFERENTIAL/PLATELET  COMPREHENSIVE METABOLIC PANEL  POC INFLUENZA A AND B ANTIGEN (URGENT CARE ONLY)    EKG   Radiology No results found.  Procedures Procedures (including critical care time)  Medications Ordered in UC Medications  ondansetron (ZOFRAN) injection 4 mg (4 mg Intramuscular Given 07/28/21 1646)    Initial Impression / Assessment and Plan / UC Course  I have reviewed the triage vital signs and the nursing notes.  Pertinent labs & imaging results that were available during my care of the patient were reviewed by me and considered in my medical decision making (see chart for details).     Vital signs and physical exam reassuring today; no indication for emergent evaluation or imaging.  Patient was given Zofran injection in clinic and able to tolerate a small amount of fluid and some crackers.  She was sent home with prescription for Zofran and encouraged to take this on a scheduled basis for the next several days to encourage oral intake.  Flu testing was negative.  COVID test is pending.  Suspect viral etiology given clinical presentation.  She was provided work excuse note with current CDC return to work guidelines based on COVID testing results.  CBC and CMP  obtained today given significant vomiting-results pending.  She was encouraged to push fluids at home including electrolyte solutions and eat a bland diet.  She can use over-the-counter medication including Tylenol, Mucinex, Flonase for additional symptom relief.  Discussed that if she has any worsening symptoms including high fever not responding  to medication, chest pain, shortness of breath, weakness, confusion, nausea/vomiting interfering with oral intake she needs to go to the emergency room.  Strict return precautions given to which she expressed understanding.  Addendum: When nursing staff went to discharge patient they were unable to draw blood work due to difficulty finding a vein.  During this time patient reported that she was feeling much worse and having lightheadedness and feeling as though she is going to throw up again despite having very little oral intake.  After discussion with patient and significant other decided to go to the emergency room for further evaluation and management since she was unable to pass oral challenge.  Her vital signs were stable so significant other will take her by private vehicle.  She was stable at the time of discharge and safe for private transport.  Final Clinical Impressions(s) / UC Diagnoses   Final diagnoses:  Upper respiratory tract infection, unspecified type  Fever, unspecified  Nausea and vomiting, unspecified vomiting type     Discharge Instructions      You are negative for the flu.  We will contact you if you are positive for COVID.  Please use Zofran every 8 hours for nausea and vomiting symptoms.  Make sure you are pushing fluids.  If you have any difficulty keeping food or drink down you need to go to the emergency room.  We will contact you tomorrow if your lab work is abnormal.  Please use over-the-counter medications including Tylenol, Mucinex, Flonase for symptom relief.  If overnight you have any worsening symptoms including high  fever not responding to medication, nausea/vomiting preventing oral intake, weakness, confusion, chest pain, shortness of breath you need to go to the ER.     ED Prescriptions     Medication Sig Dispense Auth. Provider   ondansetron (ZOFRAN-ODT) 4 MG disintegrating tablet Take 1 tablet (4 mg total) by mouth every 8 (eight) hours as needed for nausea or vomiting. 20 tablet Kashtyn Jankowski, Derry Skill, PA-C      PDMP not reviewed this encounter.   Terrilee Croak, PA-C 07/28/21 1805    RaspetDerry Skill, PA-C 07/28/21 1827

## 2021-07-28 NOTE — ED Triage Notes (Signed)
Pt presents POV brought in by her husband.   Pt states she feels dizzy, is constantly vomiting and states she started vomiting this afternoon.   States she took a BC earlier today.

## 2021-07-28 NOTE — ED Notes (Addendum)
Lab states light green tube had hemolyzed. Lab redrawn times one stick to left hand. Tolerated well.

## 2021-07-28 NOTE — Discharge Instructions (Signed)
You are negative for the flu.  We will contact you if you are positive for COVID.  Please use Zofran every 8 hours for nausea and vomiting symptoms.  Make sure you are pushing fluids.  If you have any difficulty keeping food or drink down you need to go to the emergency room.  We will contact you tomorrow if your lab work is abnormal.  Please use over-the-counter medications including Tylenol, Mucinex, Flonase for symptom relief.  If overnight you have any worsening symptoms including high fever not responding to medication, nausea/vomiting preventing oral intake, weakness, confusion, chest pain, shortness of breath you need to go to the ER.

## 2021-07-28 NOTE — ED Notes (Signed)
Pt refused ODT zofran. Provider informed .

## 2021-07-28 NOTE — ED Provider Notes (Signed)
[facility] Provider Note   CSN: [id number] Arrival date & time: 07/28/21  1900     History  Chief Complaint  Patient presents with   Emesis    [name] is a 61 y.o. female presenting to emergency department with fatigue, nausea and vomiting.  The patient is a poor historian, says she feels lethargic and has difficult time providing history.  She reports sudden onset today of general fatigue, headache, nausea, persistent vomiting.  She went to an urgent care, where they had difficulty establishing IV access, referred her to the ED for further evaluation, she was complaining of lightheadedness at that time.  She is here in the company of her friend, gives permission to provide medical information in front of them.  She denies any sick contacts in the house.  She denies any chest pain or pressure.  HPI     Home Medications Prior to Admission medications   Medication Sig Start Date End Date Taking? Authorizing Provider  ciprofloxacin (CIPRO) 250 MG tablet Take 1 tablet (250 mg total) by mouth 2 (two) times daily. 06/21/13   Nicky Pugh C, NP  ondansetron (ZOFRAN-ODT) 4 MG disintegrating tablet Take 1 tablet (4 mg total) by mouth every 8 (eight) hours as needed for nausea or vomiting. 07/28/21   Raspet, Derry Skill, PA-C  phenazopyridine (PYRIDIUM) 200 MG tablet Take 1 tablet (200 mg total) by mouth 3 (three) times daily as needed for pain. 06/21/13   Irene Pap, NP      Allergies    Amoxicillin-pot clavulanate    Review of Systems   Review of Systems  Physical Exam Updated Vital Signs BP 123/70 (BP Location: Right Arm)    Pulse (!) 56    Temp 98.1 F (36.7 C) (Temporal)    Resp 16    Ht 5' 3.5" (1.613 m)    Wt 68.9 kg    LMP 07/15/2009    SpO2 97%    BMI 26.50 kg/m  Physical Exam Constitutional:      Comments: Somnolent, fatigued, eyes closed  HENT:     Head: Normocephalic and atraumatic.  Eyes:     Conjunctiva/sclera: Conjunctivae normal.      Pupils: Pupils are equal, round, and reactive to light.  Cardiovascular:     Rate and Rhythm: Normal rate and regular rhythm.  Pulmonary:     Effort: Pulmonary effort is normal. No respiratory distress.  Abdominal:     General: There is no distension.     Tenderness: There is no abdominal tenderness.  Skin:    General: Skin is warm and dry.  Neurological:     General: No focal deficit present.     Mental Status: She is alert. Mental status is at baseline.  Psychiatric:        Mood and Affect: Mood normal.        Behavior: Behavior normal.    ED Results / Procedures / Treatments   Labs (all labs ordered are listed, but only abnormal results are displayed) Labs Reviewed  RESP PANEL BY RT-PCR (FLU A&B, COVID) [id number]  CBC WITH DIFFERENTIAL/PLATELET  COMPREHENSIVE METABOLIC PANEL  LIPASE, BLOOD  URINALYSIS, ROUTINE W REFLEX MICROSCOPIC    EKG None  Radiology No results found.  Procedures Procedures    Medications Ordered in ED Medications - No data to display  ED Course/ Medical Decision Making/ A&P  Medical Decision Making  This patient presents to the ED with concern for fatigue, weakness.  This involves an extensive number of treatment options, and is a complaint that carries with it a high risk of complications and morbidity.  The differential diagnosis includes viral syndrome versus anemia versus atypical ACS versus other  No meningismus or fever or nuccal rigidity to suggest meningitis at this time  Additional history obtained from friend at bedside    I ordered and personally interpreted labs.  The pertinent results include:  covid/flu negative.  Remainder tests pending   I ordered medication including IV fluids, IV toradol for hydration, headache  I have reviewed the patients home medicines and have made adjustments as needed   1130 pm - patient signed out to Dr Stark Jock EDP pending f/u on labs, reassessment after fluids and  medications         Final Clinical Impression(s) / ED Diagnoses Final diagnoses:  None    Rx / DC Orders ED Discharge Orders     None         Evey Mcmahan, Carola Rhine, MD 07/28/21 2343

## 2021-07-28 NOTE — ED Triage Notes (Signed)
Pt sent here from UC for dehydration. Pt having vomiting that started this afternoon.

## 2021-07-29 ENCOUNTER — Emergency Department (HOSPITAL_BASED_OUTPATIENT_CLINIC_OR_DEPARTMENT_OTHER): Payer: 59

## 2021-07-29 ENCOUNTER — Emergency Department (HOSPITAL_COMMUNITY): Payer: 59

## 2021-07-29 ENCOUNTER — Inpatient Hospital Stay (HOSPITAL_COMMUNITY): Payer: 59

## 2021-07-29 DIAGNOSIS — F17210 Nicotine dependence, cigarettes, uncomplicated: Secondary | ICD-10-CM | POA: Diagnosis present

## 2021-07-29 DIAGNOSIS — R350 Frequency of micturition: Secondary | ICD-10-CM | POA: Diagnosis not present

## 2021-07-29 DIAGNOSIS — Z880 Allergy status to penicillin: Secondary | ICD-10-CM | POA: Diagnosis not present

## 2021-07-29 DIAGNOSIS — E119 Type 2 diabetes mellitus without complications: Secondary | ICD-10-CM | POA: Diagnosis not present

## 2021-07-29 DIAGNOSIS — Z83438 Family history of other disorder of lipoprotein metabolism and other lipidemia: Secondary | ICD-10-CM | POA: Diagnosis not present

## 2021-07-29 DIAGNOSIS — I69398 Other sequelae of cerebral infarction: Secondary | ICD-10-CM | POA: Diagnosis not present

## 2021-07-29 DIAGNOSIS — I63532 Cerebral infarction due to unspecified occlusion or stenosis of left posterior cerebral artery: Secondary | ICD-10-CM | POA: Diagnosis not present

## 2021-07-29 DIAGNOSIS — Z823 Family history of stroke: Secondary | ICD-10-CM | POA: Diagnosis not present

## 2021-07-29 DIAGNOSIS — G935 Compression of brain: Secondary | ICD-10-CM | POA: Diagnosis present

## 2021-07-29 DIAGNOSIS — I639 Cerebral infarction, unspecified: Secondary | ICD-10-CM | POA: Diagnosis not present

## 2021-07-29 DIAGNOSIS — I6389 Other cerebral infarction: Secondary | ICD-10-CM | POA: Diagnosis not present

## 2021-07-29 DIAGNOSIS — Z833 Family history of diabetes mellitus: Secondary | ICD-10-CM | POA: Diagnosis not present

## 2021-07-29 DIAGNOSIS — E785 Hyperlipidemia, unspecified: Secondary | ICD-10-CM | POA: Diagnosis present

## 2021-07-29 DIAGNOSIS — I63441 Cerebral infarction due to embolism of right cerebellar artery: Secondary | ICD-10-CM | POA: Diagnosis present

## 2021-07-29 DIAGNOSIS — Z20822 Contact with and (suspected) exposure to covid-19: Secondary | ICD-10-CM | POA: Diagnosis present

## 2021-07-29 DIAGNOSIS — R260 Ataxic gait: Secondary | ICD-10-CM | POA: Diagnosis not present

## 2021-07-29 DIAGNOSIS — R7303 Prediabetes: Secondary | ICD-10-CM | POA: Diagnosis present

## 2021-07-29 DIAGNOSIS — R519 Headache, unspecified: Secondary | ICD-10-CM | POA: Diagnosis present

## 2021-07-29 DIAGNOSIS — R29700 NIHSS score 0: Secondary | ICD-10-CM | POA: Diagnosis present

## 2021-07-29 DIAGNOSIS — Z8249 Family history of ischemic heart disease and other diseases of the circulatory system: Secondary | ICD-10-CM | POA: Diagnosis not present

## 2021-07-29 DIAGNOSIS — G936 Cerebral edema: Secondary | ICD-10-CM | POA: Diagnosis present

## 2021-07-29 DIAGNOSIS — R42 Dizziness and giddiness: Secondary | ICD-10-CM | POA: Diagnosis not present

## 2021-07-29 LAB — COMPREHENSIVE METABOLIC PANEL
ALT: 11 U/L (ref 0–44)
AST: 16 U/L (ref 15–41)
Albumin: 3.7 g/dL (ref 3.5–5.0)
Alkaline Phosphatase: 63 U/L (ref 38–126)
Anion gap: 10 (ref 5–15)
BUN: 10 mg/dL (ref 6–20)
CO2: 23 mmol/L (ref 22–32)
Calcium: 8.5 mg/dL — ABNORMAL LOW (ref 8.9–10.3)
Chloride: 107 mmol/L (ref 98–111)
Creatinine, Ser: 0.58 mg/dL (ref 0.44–1.00)
GFR, Estimated: >60 mL/min (ref >60)
Glucose, Bld: 125 mg/dL — ABNORMAL HIGH (ref 70–99)
Potassium: 4 mmol/L (ref 3.5–5.1)
Sodium: 140 mmol/L (ref 135–145)
Total Bilirubin: 0.5 mg/dL (ref 0.3–1.2)
Total Protein: 7 g/dL (ref 6.5–8.1)

## 2021-07-29 LAB — LIPID PANEL
Cholesterol: 215 mg/dL — ABNORMAL HIGH (ref 0–200)
HDL: 39 mg/dL — ABNORMAL LOW (ref >40)
LDL Cholesterol: 161 mg/dL — ABNORMAL HIGH (ref 0–99)
Total CHOL/HDL Ratio: 5.5 RATIO
Triglycerides: 76 mg/dL (ref <150)
VLDL: 15 mg/dL (ref 0–40)

## 2021-07-29 LAB — TROPONIN I (HIGH SENSITIVITY)
Troponin I (High Sensitivity): 3 ng/L (ref <18)
Troponin I (High Sensitivity): 3 ng/L (ref <18)

## 2021-07-29 LAB — HEMOGLOBIN A1C
Hgb A1c MFr Bld: 6.1 % — ABNORMAL HIGH (ref 4.8–5.6)
Mean Plasma Glucose: 128.37 mg/dL

## 2021-07-29 LAB — LIPASE, BLOOD: Lipase: 10 U/L — ABNORMAL LOW (ref 11–51)

## 2021-07-29 MED ORDER — ROSUVASTATIN CALCIUM 20 MG PO TABS
20.0000 mg | ORAL_TABLET | Freq: Every day | ORAL | Status: DC
  Administered 2021-07-29 – 2021-08-03 (×6): 20 mg via ORAL
  Filled 2021-07-29 (×6): qty 1

## 2021-07-29 MED ORDER — IOHEXOL 350 MG/ML SOLN
75.0000 mL | Freq: Once | INTRAVENOUS | Status: AC | PRN
  Administered 2021-07-29: 75 mL via INTRAVENOUS

## 2021-07-29 MED ORDER — ONDANSETRON HCL 4 MG/2ML IJ SOLN
4.0000 mg | Freq: Once | INTRAMUSCULAR | Status: AC
  Administered 2021-07-29: 4 mg via INTRAVENOUS
  Filled 2021-07-29: qty 2

## 2021-07-29 MED ORDER — ACETAMINOPHEN 650 MG RE SUPP: 650.0000 mg | Freq: Four times a day (QID) | RECTAL | Status: DC | PRN

## 2021-07-29 MED ORDER — POLYETHYLENE GLYCOL 3350 17 G PO PACK
17.0000 g | PACK | Freq: Every day | ORAL | Status: DC | PRN
  Administered 2021-08-02: 17 g via ORAL
  Filled 2021-07-29: qty 1

## 2021-07-29 MED ORDER — MECLIZINE HCL 25 MG PO TABS
25.0000 mg | ORAL_TABLET | Freq: Once | ORAL | Status: AC
  Administered 2021-07-29: 25 mg via ORAL
  Filled 2021-07-29: qty 1

## 2021-07-29 MED ORDER — ACETAMINOPHEN 325 MG PO TABS
650.0000 mg | ORAL_TABLET | Freq: Four times a day (QID) | ORAL | Status: DC | PRN
  Administered 2021-07-29 – 2021-08-01 (×4): 650 mg via ORAL
  Filled 2021-07-29 (×4): qty 2

## 2021-07-29 MED ORDER — ASPIRIN 325 MG PO TABS
325.0000 mg | ORAL_TABLET | Freq: Every day | ORAL | Status: DC
  Administered 2021-07-29: 325 mg via ORAL
  Filled 2021-07-29: qty 1

## 2021-07-29 MED ORDER — ENOXAPARIN SODIUM 40 MG/0.4ML IJ SOSY: 40.0000 mg | PREFILLED_SYRINGE | INTRAMUSCULAR | Status: DC

## 2021-07-29 MED ORDER — SODIUM CHLORIDE 0.9 % IV BOLUS
500.0000 mL | Freq: Once | INTRAVENOUS | Status: AC
  Administered 2021-07-29: 500 mL via INTRAVENOUS

## 2021-07-29 MED ORDER — ASPIRIN EC 81 MG PO TBEC: 81.0000 mg | DELAYED_RELEASE_TABLET | Freq: Every day | ORAL | Status: DC

## 2021-07-29 MED ORDER — ACETAMINOPHEN 500 MG PO TABS
1000.0000 mg | ORAL_TABLET | Freq: Once | ORAL | Status: AC
  Administered 2021-07-29: 1000 mg via ORAL
  Filled 2021-07-29: qty 2

## 2021-07-29 NOTE — ED Provider Notes (Signed)
°  Physical Exam  BP 126/68 (BP Location: Right Arm)    Pulse 76    Temp 98.1 F (36.7 C) (Temporal)    Resp 17    Ht 5' 3.5" (1.613 m)    Wt 68.9 kg    LMP 07/15/2009    SpO2 100%    BMI 26.50 kg/m   Physical Exam Vitals and nursing note reviewed.  Constitutional:      General: She is not in acute distress.    Appearance: Normal appearance. She is not ill-appearing.  HENT:     Head: Normocephalic and atraumatic.  Cardiovascular:     Rate and Rhythm: Normal rate and regular rhythm.  Pulmonary:     Effort: Pulmonary effort is normal.  Skin:    General: Skin is warm and dry.  Neurological:     Mental Status: She is alert and oriented to person, place, and time.     Comments: Patient is awake and alert, but is unsteady when attempting to stand.  She is staggering with her gait and nearly falls.  She moves all 4 extremities and coordination is otherwise within normal limits.    Procedures  Procedures  ED Course / MDM    Medical Decision Making  Care assumed from Dr. Langston Masker at shift change.  Patient presented here feeling poorly since this morning.  She is also dizzy and having difficulty ambulating.  Patient has received IV fluids along with meclizine and Zofran, however continues with dizziness and unsteady gait.  I feel as though patient will require further imaging with MRI.  I spoke with Drs. Horton and Long at the [facility], ER who agreed to accept patient in transfer.       Veryl Speak, MD 07/29/21 0530

## 2021-07-29 NOTE — ED Notes (Signed)
Pt stated she has a headache in the front of her head. She compared it to having sinus pressure. Notified EDP

## 2021-07-29 NOTE — ED Notes (Signed)
MD stated to notify her is pt headache has not improved with the Tylenol.

## 2021-07-29 NOTE — ED Notes (Signed)
Breakfast tray ordered 

## 2021-07-29 NOTE — ED Notes (Signed)
Patient transported to MRI 

## 2021-07-29 NOTE — ED Notes (Signed)
Neurologist at the bedside 

## 2021-07-29 NOTE — H&P (Shared)
° ° ° °  Date: 07/29/2021               Patient Name:  BRANDEN VINE MRN: [id number]  DOB: [birth year] Age / Sex: 61 y.o., female   PCP: Jearld Fenton, NP         Medical Service: Internal Medicine Teaching Service         Attending Physician: Dr. Blanchie Dessert, MD    First Contact: Dr. Lorin Glass Pager: [phone]  Second Contact: Dr. Alfonse Spruce Pager: [phone]       After Hours (After 5p/  First Contact Pager: [phone]  weekends / holidays): Second Contact Pager: [phone]   Chief Complaint: Emesis, dizziness  History of Present Illness: LEINA BABE is a 61 y.o. female with no known pertinent PMHx who presents to [facility] today with a CC of emesis and dizziness. ***    Meds: *** No outpatient medications have been marked as taking for the 07/28/21 encounter [facility] Encounter).     Allergies: Allergies as of 07/28/2021 - Review Complete 07/28/2021  Allergen Reaction Noted   Amoxicillin-pot clavulanate Rash 02/08/2020   Past Medical History:  Diagnosis Date   Blood in stool    Hyperlipidemia    Vaginal fibroids     Family History: ***  Social History: ***  Review of Systems: A complete ROS was negative except as per HPI.  Physical Exam: Blood pressure (!) 117/57, pulse (!) 56, temperature 98.1 F (36.7 C), temperature source Temporal, resp. rate 17, height 5' 3.5" (1.613 m), weight 68.9 kg, last menstrual period 07/15/2009, SpO2 98 %. ***    CT Head WO Contrast: IMPRESSION: Mild chronic white matter ischemic changes. No acute abnormality noted.  MRI Brain WO Contrast:  IMPRESSION: Acute infarcts in the right cerebellum and left occipital cortex suggesting posterior circulation emboli. Patent fourth ventricle. Moderate chronic white matter disease.   Assessment & Plan by Problem: Active Problems:   * No active hospital problems. 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

## 2021-07-29 NOTE — ED Provider Notes (Signed)
Assumed care of the patient at 7 AM.  [place] evaluation due to persistent vertiginous symptoms.  Patient's MRI has returned and does show an acute infarct in the right cerebellum.  Patient will need admission for stroke work-up.  I consulted neurology and patient will need hospitalist admission.   Blanchie Dessert, MD 07/29/21 [phone]

## 2021-07-29 NOTE — ED Notes (Signed)
Lunch at the bedside

## 2021-07-29 NOTE — ED Notes (Signed)
RN used wheelchair to take pt to restroom.

## 2021-07-29 NOTE — Consult Note (Signed)
Neurology Consultation  Reason for Consult: MRI brain with right cerebellum and left occipital cortex acute infarctions  Referring Physician: Dr. Maryan Rued  CC: Dizziness, unsteady gait, vomiting   History is obtained from: Patient, Chart review  HPI: [name] is a 61 y.o. female with a medical history significant for hyperlipidemia, tobacco use, and vaginal fibroids who was seen on 1/14 at urgent care for complaints of headache, nausea and vomiting, and fever without relief with BC powder at home and during evaluation she came lightheaded and was sent to the emergency room for evaluation of dehydration. On evaluation in the ED, patient was complaining of lethargy, nausea and vomiting without response to medication.  CT head was obtained without acute abnormality. She was also noted to be unsteady with standing with a staggering gait and she was sent to [facility] for evaluation of possible central process. MRI brain was obtained on arrival to [facility] revealing acute infarcts in the right cerebellum and left occipital cortex suggesting posterior circulation emboli and neurology was consulted for further evaluation.  Patient does complain of feeling palpitations on 1/14 during the acute onset of her other presenting symptoms.  LKW: 1/14 12:00 TNK given?: no, patient is outside of the window for thrombolytic therapy IR Thrombectomy? No, patient's presentation is not consistent with LVO Modified Rankin Scale: 0-Completely asymptomatic and back to baseline post- stroke  ROS: A complete ROS was performed and is negative except as noted in the HPI.   Past Medical History:  Diagnosis Date   Blood in stool    Hyperlipidemia    Vaginal fibroids    History reviewed. No pertinent surgical history.  Family History  Problem Relation Age of Onset   Diabetes Father    Hypertension Father    Heart disease Father    Hyperlipidemia Father    Stroke Father    Cancer Neg Hx    Social History:    reports that she has quit smoking. She does not have any smokeless tobacco history on file. She reports that she does not drink alcohol and does not use drugs. Patient does report that she smokes cigarettes and has for "a long time"  Medications No current facility-administered medications for this encounter.  Current Outpatient Medications:    Aspirin-Acetaminophen-Caffeine (GOODY HEADACHE PO), Take 1 Dose by mouth every 6 (six) hours as needed (pain, headache)., Disp: , Rfl:    ondansetron (ZOFRAN-ODT) 4 MG disintegrating tablet, Take 1 tablet (4 mg total) by mouth every 8 (eight) hours as needed for nausea or vomiting. (Patient not taking: Reported on 07/29/2021), Disp: 20 tablet, Rfl: 0  Exam: Current vital signs: BP (!) 117/57 (BP Location: Left Arm)    Pulse (!) 56    Temp 98.1 F (36.7 C) (Temporal)    Resp 17    Ht 5' 3.5" (1.613 m)    Wt 68.9 kg    LMP 07/15/2009    SpO2 98%    BMI 26.50 kg/m  Vital signs in last 24 hours: Temp:  [98.1 F (36.7 C)] 98.1 F (36.7 C) (01/14 1932) Pulse Rate:  [54-76] 56 (01/15 0920) Resp:  [16-20] 17 (01/15 0920) BP: (106-130)/(54-70) 117/57 (01/15 0920) SpO2:  [97 %-100 %] 98 % (01/15 0920) Weight:  [68.9 kg] 68.9 kg (01/14 1928)  GENERAL: Awake, alert, in no acute distress Psych: Affect appropriate for situation, patient is calm and cooperative with examination Head: Normocephalic and atraumatic, without obvious abnormality EENT: Normal conjunctivae, dry mucous membranes, no OP obstruction,  arcus senilis present bilaterally LUNGS: Normal respiratory effort. Non-labored breathing on room air CV: Bradycardia on cardiac monitor, no pedal edema noted ABDOMEN: Soft, non-tender, non-distended Extremities: warm, well perfused, without obvious deformity  NEURO:  Mental Status: Awake, alert, and oriented to person, place, time, and situation. She is able to provide a clear and coherent history of present illness. Speech/Language: speech is  intact without dysarthria.   Naming, repetition, fluency, and comprehension intact without aphasia. No neglect is noted. Cranial Nerves:  II: PERRL 3 mm/brisk. Visual fields full.  III, IV, VI: EOMI without ptosis, nystagmus, or gaze preference V: Sensation is intact to light touch and symmetrical to face. Patient does state that she feels that her whole face is "heavy" VII: Face is symmetric resting and smiling.  VIII: Hearing is intact to voice IX, X: Palate elevation is symmetric. Phonation normal.  XI: Normal sternocleidomastoid and trapezius muscle strength XII: Tongue protrudes midline without fasciculations.   Motor: 5/5 strength is all muscle groups without vertical drift on assessment. Tone is normal. Bulk is normal.  Sensation: Intact to light touch bilaterally in all four extremities. No extinction to DSS present.  Coordination: FTN intact bilaterally. HKS intact bilaterally. No pronator drift.  DTRs: 2+ and symmetric throughout.  Gait: Deferred  NIHSS: 0  Labs I have reviewed labs in epic and the results pertinent to this consultation are: CBC    Component Value Date/Time   WBC 9.0 07/28/2021 2212   RBC 4.73 07/28/2021 2212   HGB 14.0 07/28/2021 2212   HCT 42.5 07/28/2021 2212   PLT 206 07/28/2021 2212   MCV 89.9 07/28/2021 2212   MCH 29.6 07/28/2021 2212   MCHC 32.9 07/28/2021 2212   RDW 14.6 07/28/2021 2212   LYMPHSABS 1.5 07/28/2021 2212   MONOABS 0.3 07/28/2021 2212   EOSABS 0.0 07/28/2021 2212   BASOSABS 0.0 07/28/2021 2212   CMP     Component Value Date/Time   NA 140 07/28/2021 2322   K 4.0 07/28/2021 2322   CL 107 07/28/2021 2322   CO2 23 07/28/2021 2322   GLUCOSE 125 (H) 07/28/2021 2322   BUN 10 07/28/2021 2322   CREATININE 0.58 07/28/2021 2322   CALCIUM 8.5 (L) 07/28/2021 2322   PROT 7.0 07/28/2021 2322   ALBUMIN 3.7 07/28/2021 2322   AST 16 07/28/2021 2322   ALT 11 07/28/2021 2322   ALKPHOS 63 07/28/2021 2322   BILITOT 0.5 07/28/2021 2322    GFRNONAA >60 07/28/2021 2322   Lipid Panel     Component Value Date/Time   CHOL 250 (H) 06/17/2012 1111   TRIG 105.0 06/17/2012 1111   HDL 38.10 (L) 06/17/2012 1111   CHOLHDL 7 06/17/2012 1111   VLDL 21.0 06/17/2012 1111   LDLDIRECT 196.4 06/17/2012 1111  No results found for: HGBA1C  Imaging I have reviewed the images obtained:  CT-scan of the brain 1/15: Mild chronic white matter ischemic changes. No acute abnormality noted.  MRI examination of the brain 1/15: Acute infarcts in the right cerebellum and left occipital cortex suggesting posterior circulation emboli. Patent fourth ventricle. Moderate chronic white matter disease.  Assessment: 61 y.o. female with history as above including hyperlipidemia and tobacco use who presented to the ED from OSH for evaluation of a central source of nausea, vomiting, and unsteady / staggering gait. MRI brain revealed acute infarcts in the right cerebellum and left occipital cortex suggesting posterior circulation emboli. - Examination reveals patient with frontal headache 8/10 in severity, nausea, lethargy, but without focal  neurologic findings. Her NIHSS is a 0. Patient requests not to stand on evaluation because she feels extremely sick with standing and is unsteady. There is no obvious dysmetria on examination. - Imaging as above. - Presentation is most consistent with sequale of acute infarction in the right cerebellum. Will complete stroke work up.  - Stroke risk factors include history of hyperlipidemia and tobacco use.  Recommendations: - Repeat CT head 12 hours after most recent scan (the CTA performed at 11:20 AM) to assess for stability of the right cerebellar infarction with special attention regarding possible clinically significant mass effect on the 4th ventricle and brainstem. The repeat scan has been ordered for 11:30 PM and should be promptly performed at that time.  - HgbA1c, fasting lipid panel - CT angio head and neck -  Frequent neuro checks - Echocardiogram - Prophylactic therapy- Antiplatelet med: Aspirin - dose 325mg  PO or 300mg  PR followed by ASA 81 mg PO daily - Risk factor modification - Telemetry monitoring - PT consult, OT consult, Speech consult - Stroke team to follow  Pt seen by NP/Neuro and by MD.   Anibal Henderson, AGAC-NP [facility] Pager: [phone]  I have seen and examined the patient. I have discussed the assessment and recommendations with the Neurology NP and have made amendations as needed. 61 y.o. female with history as above including hyperlipidemia and tobacco use who presented to the ED from OSH for evaluation of a central source of nausea, vomiting, and unsteady / staggering gait. MRI brain revealed acute infarcts in the right cerebellum and left occipital cortex suggesting posterior circulation emboli. Exam shows no focal weakness and, unexpectedly, no ataxia or nystagmus. Recommendations for stroke work up as above.  Electronically signed: Dr. Kerney Elbe

## 2021-07-29 NOTE — H&P (Signed)
Date: 07/29/2021               Patient Name:  [name] MRN: [id number]  DOB: 1960-12-31 Age / Sex: 61 y.o., female   PCP: Mckenzie Fenton, NP         Medical Service: Internal Medicine Teaching Service         Attending Physician: Dr. Evette Doffing, Mallie Mussel, *    First Contact: Dr. Christiana Fuchs Pager: [phone]  Second Contact: Dr. Gaylan Gerold  Pager: [phone]       After Hours (After 5p/  First Contact Pager: [phone]  weekends / holidays): Second Contact Pager: [phone]   Chief Complaint:   History of Present Illness: nausea, vomiting  [name] is a 61 year old female with past medical history of hyperlipidemia who presents to the hospital for evaluation of her nausea and vomiting when standing up.  She reports symptoms of nausea, vomiting, headache a few days ago and went to the urgent care on 1/14 for evaluation and was diagnosed with upper respiratory tract infection.  Today she reports persistent symptoms which prompted her to go to the ED. Patient denies any nausea vomiting with laying down.  Symptoms only present with standing up.  She is feeling very sleepy right now, says she has not had any sleep last night.  She does not have a PCP and does not take any medications at home.  In the ED, CT has was negative for any acute abnormality.  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  She was admitted for acute CVA.  Neurology was consulted.  Meds:  Current Meds  Medication Sig   Aspirin-Acetaminophen-Caffeine (GOODY HEADACHE PO) Take 1 Dose by mouth every 6 (six) hours as needed (pain, headache).    Allergies: Allergies as of 07/28/2021 - Review Complete 07/28/2021  Allergen Reaction Noted   Amoxicillin-pot clavulanate Rash 02/08/2020   Past Medical History:  Diagnosis Date   Blood in stool    Hyperlipidemia    Vaginal fibroids     Family History:  Father-diabetes  Social  History:  Smoke 1 or 2 cigarettes a day Denies alcohol or substance use. She works in a [employer].   Review of Systems: A complete ROS was negative except as per HPI.   Physical Exam: Blood pressure (!) 117/57, pulse (!) 56, temperature 98.1 F (36.7 C), temperature source Temporal, resp. rate 17, height 5' 3.5" (1.613 m), weight 68.9 kg, last menstrual period 07/15/2009, SpO2 98 %. Physical Exam Constitutional:      General: She is not in acute distress.    Appearance: She is not ill-appearing or toxic-appearing.     Comments: Somnolent but arousable to verbal stimulation  HENT:     Head: Normocephalic.  Eyes:     General:        Right eye: No discharge.        Left eye: No discharge.     Conjunctiva/sclera: Conjunctivae normal.  Cardiovascular:     Rate and Rhythm: Normal rate and regular rhythm.  Pulmonary:     Effort: Pulmonary effort is normal. No respiratory distress.     Breath sounds: Normal breath sounds. No wheezing.  Abdominal:     General: Bowel sounds are normal. There is no distension.     Palpations: Abdomen is soft.  Musculoskeletal:        General: Normal range of motion.  Cervical back: Normal range of motion.  Skin:    General: Skin is warm.  Neurological:     Mental Status: She is oriented to person, place, and time.     Comments: PERRLA EOM intact Vertical and horizontal nystagmus observed.  Patient reports double vision with upward gaze. Normal strength, range of motion and sensation of bilateral upper and lower extremities.    Psychiatric:        Behavior: Behavior normal.    Assessment & Plan by Problem: Principal Problem:   CVA (cerebral vascular accident) ([place])  [name] is a 61 year old female with past medical history of hyperlipidemia who presents to the hospital for evaluation of her nausea and vomiting when standing up, admitted for acute infarct of right cerebellum and left occipital cortex.  Acute  CVA Hyperlipidemia RI showed acute infarcts in the right cerebellum and left occipital cortex, suggesting embolic.  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 Her risk factor for CVA including tobacco use and history of hyperlipidemia.  She is also prediabetic with A1c of 6.1. -Appreciate neurology recommendations -Obtain echo -Telemetry and EKG -Start Crestor 20 mg  -Aspirin -PT/OT  Tobacco use disorder Encourage cessation   Full code Diet: Regular IVF: N/A DVT: Lovenox   Dispo: Admit patient to Inpatient with expected length of stay greater than 2 midnights.  Signed: Gaylan Gerold, DO 07/29/2021, 2:18 PM  Pager: [phone] After 5pm on weekdays and 1pm on weekends: On Call pager: [phone]

## 2021-07-29 NOTE — ED Notes (Signed)
Provider at the bedside.  

## 2021-07-29 NOTE — ED Notes (Signed)
Upon returning to bed from restroom pt stated her head is still hurting. She said the Tylenol gave some relief however the headache has came back. I will give pt PRN Tylenol and notify provider.

## 2021-07-29 NOTE — ED Notes (Addendum)
Pt assisted to restroom. Pt is extremely unsteady on feet, appearing both weak and uncoordinated bilaterally. Sensation, movement present and equal bilaterally, face symmetrical. Indorses headache, intermittently holding head in hands. Pt affect flat and reserved.

## 2021-07-29 NOTE — ED Notes (Signed)
Pt eating breakfast tray at the bedside ?

## 2021-07-29 NOTE — ED Notes (Signed)
Pt arrived to [facility] from [place] via [place]. Pt denies any complaints at present. Pt resting on stretcher with eyes closed, respirations even and unlabored. No acute changes noted. Will continue to monitor.

## 2021-07-29 NOTE — ED Provider Notes (Signed)
Blood pressure 126/68, pulse 76, temperature 98.1 F (36.7 C), temperature source Temporal, resp. rate 17, height 5' 3.5" (1.613 m), weight 68.9 kg, last menstrual period 07/15/2009, SpO2 100 %.  In short, [name] is a 61 y.o. female with a chief complaint of Emesis .  Refer to the original H&P for additional details.  04:45 AM  Patient arrives in transfer to from [facility] with ataxia and dizziness. Not responding to medication at the outside ED and transferred here for evaluation for possible central process. MRI ordered.   MRI pending. Care transferred to Dr. Maryan Rued.     Margette Fast, MD 07/31/21 0700

## 2021-07-30 ENCOUNTER — Inpatient Hospital Stay (HOSPITAL_COMMUNITY): Payer: 59

## 2021-07-30 DIAGNOSIS — E785 Hyperlipidemia, unspecified: Secondary | ICD-10-CM | POA: Diagnosis present

## 2021-07-30 DIAGNOSIS — G936 Cerebral edema: Secondary | ICD-10-CM | POA: Diagnosis present

## 2021-07-30 DIAGNOSIS — I639 Cerebral infarction, unspecified: Secondary | ICD-10-CM | POA: Diagnosis not present

## 2021-07-30 DIAGNOSIS — I6389 Other cerebral infarction: Secondary | ICD-10-CM | POA: Diagnosis not present

## 2021-07-30 DIAGNOSIS — R7303 Prediabetes: Secondary | ICD-10-CM | POA: Diagnosis present

## 2021-07-30 DIAGNOSIS — R42 Dizziness and giddiness: Secondary | ICD-10-CM | POA: Diagnosis not present

## 2021-07-30 LAB — BASIC METABOLIC PANEL
Anion gap: 8 (ref 5–15)
Anion gap: 9 (ref 5–15)
Anion gap: 6 (ref 5–15)
Anion gap: 8 (ref 5–15)
Anion gap: 8 (ref 5–15)
Anion gap: 5 (ref 5–15)
BUN: 10 mg/dL (ref 6–20)
BUN: 10 mg/dL (ref 6–20)
BUN: 9 mg/dL (ref 6–20)
BUN: 8 mg/dL (ref 6–20)
BUN: 6 mg/dL (ref 6–20)
BUN: 6 mg/dL (ref 6–20)
CO2: 23 mmol/L (ref 22–32)
CO2: 26 mmol/L (ref 22–32)
CO2: 25 mmol/L (ref 22–32)
CO2: 24 mmol/L (ref 22–32)
CO2: 22 mmol/L (ref 22–32)
CO2: 24 mmol/L (ref 22–32)
Calcium: 8.5 mg/dL — ABNORMAL LOW (ref 8.9–10.3)
Calcium: 8.9 mg/dL (ref 8.9–10.3)
Calcium: 8.6 mg/dL — ABNORMAL LOW (ref 8.9–10.3)
Calcium: 8.6 mg/dL — ABNORMAL LOW (ref 8.9–10.3)
Calcium: 8.6 mg/dL — ABNORMAL LOW (ref 8.9–10.3)
Calcium: 8.4 mg/dL — ABNORMAL LOW (ref 8.9–10.3)
Chloride: 107 mmol/L (ref 98–111)
Chloride: 109 mmol/L (ref 98–111)
Chloride: 112 mmol/L — ABNORMAL HIGH (ref 98–111)
Chloride: 114 mmol/L — ABNORMAL HIGH (ref 98–111)
Chloride: 114 mmol/L — ABNORMAL HIGH (ref 98–111)
Chloride: 115 mmol/L — ABNORMAL HIGH (ref 98–111)
Creatinine, Ser: 0.79 mg/dL (ref 0.44–1.00)
Creatinine, Ser: 0.82 mg/dL (ref 0.44–1.00)
Creatinine, Ser: 0.73 mg/dL (ref 0.44–1.00)
Creatinine, Ser: 0.77 mg/dL (ref 0.44–1.00)
Creatinine, Ser: 0.73 mg/dL (ref 0.44–1.00)
Creatinine, Ser: 0.66 mg/dL (ref 0.44–1.00)
GFR, Estimated: >60 mL/min (ref >60)
GFR, Estimated: >60 mL/min (ref >60)
GFR, Estimated: >60 mL/min (ref >60)
GFR, Estimated: >60 mL/min (ref >60)
GFR, Estimated: >60 mL/min (ref >60)
GFR, Estimated: >60 mL/min (ref >60)
Glucose, Bld: 96 mg/dL (ref 70–99)
Glucose, Bld: 95 mg/dL (ref 70–99)
Glucose, Bld: 99 mg/dL (ref 70–99)
Glucose, Bld: 91 mg/dL (ref 70–99)
Glucose, Bld: 158 mg/dL — ABNORMAL HIGH (ref 70–99)
Glucose, Bld: 119 mg/dL — ABNORMAL HIGH (ref 70–99)
Potassium: 3.9 mmol/L (ref 3.5–5.1)
Potassium: 3.9 mmol/L (ref 3.5–5.1)
Potassium: 4 mmol/L (ref 3.5–5.1)
Potassium: 3.9 mmol/L (ref 3.5–5.1)
Potassium: 3.7 mmol/L (ref 3.5–5.1)
Potassium: 3.9 mmol/L (ref 3.5–5.1)
Sodium: 138 mmol/L (ref 135–145)
Sodium: 144 mmol/L (ref 135–145)
Sodium: 143 mmol/L (ref 135–145)
Sodium: 146 mmol/L — ABNORMAL HIGH (ref 135–145)
Sodium: 144 mmol/L (ref 135–145)
Sodium: 144 mmol/L (ref 135–145)

## 2021-07-30 LAB — MRSA NEXT GEN BY PCR, NASAL: MRSA by PCR Next Gen: DETECTED — AB

## 2021-07-30 LAB — CBC
HCT: 40.9 % (ref 36.0–46.0)
Hemoglobin: 13.5 g/dL (ref 12.0–15.0)
MCH: 30.2 pg (ref 26.0–34.0)
MCHC: 33 g/dL (ref 30.0–36.0)
MCV: 91.5 fL (ref 80.0–100.0)
Platelets: 207 10*3/uL (ref 150–400)
RBC: 4.47 MIL/uL (ref 3.87–5.11)
RDW: 14.6 % (ref 11.5–15.5)
WBC: 8 10*3/uL (ref 4.0–10.5)
nRBC: 0 % (ref 0.0–0.2)

## 2021-07-30 LAB — GLUCOSE, CAPILLARY: Glucose-Capillary: 138 mg/dL — ABNORMAL HIGH (ref 70–99)

## 2021-07-30 LAB — ECHOCARDIOGRAM COMPLETE BUBBLE STUDY
Area-P 1/2: 6.71 cm2
S' Lateral: 3 cm

## 2021-07-30 LAB — TSH: TSH: 0.524 u[IU]/mL (ref 0.350–4.500)

## 2021-07-30 LAB — PROTIME-INR
INR: 1 (ref 0.8–1.2)
Prothrombin Time: 12.6 seconds (ref 11.4–15.2)

## 2021-07-30 LAB — SODIUM: Sodium: 143 mmol/L (ref 135–145)

## 2021-07-30 LAB — HIV ANTIBODY (ROUTINE TESTING W REFLEX): HIV Screen 4th Generation wRfx: NONREACTIVE

## 2021-07-30 MED ORDER — SODIUM CHLORIDE 0.9% FLUSH
10.0000 mL | Freq: Once | INTRAVENOUS | Status: AC
  Administered 2021-07-30: 10 mL via INTRAVENOUS

## 2021-07-30 MED ORDER — SODIUM CHLORIDE 3 % IV SOLN
75.0000 mL/h | INTRAVENOUS | Status: DC
  Administered 2021-07-30 (×3): 75 mL/h via INTRAVENOUS
  Administered 2021-07-31: 35 mL/h via INTRAVENOUS
  Administered 2021-07-31: 75 mL/h via INTRAVENOUS
  Filled 2021-07-30 (×6): qty 500

## 2021-07-30 MED ORDER — MUPIROCIN 2 % EX OINT
1.0000 "application " | TOPICAL_OINTMENT | Freq: Two times a day (BID) | CUTANEOUS | Status: DC
  Administered 2021-07-30 – 2021-08-02 (×8): 1 via NASAL
  Filled 2021-07-30: qty 22

## 2021-07-30 MED ORDER — CHLORHEXIDINE GLUCONATE CLOTH 2 % EX PADS
6.0000 | MEDICATED_PAD | Freq: Every day | CUTANEOUS | Status: DC
  Administered 2021-07-30 – 2021-08-02 (×4): 6 via TOPICAL

## 2021-07-30 MED ORDER — SODIUM CHLORIDE 3 % IV BOLUS
75.0000 mL | Freq: Once | INTRAVENOUS | Status: AC
  Administered 2021-07-30: 75 mL via INTRAVENOUS
  Filled 2021-07-30: qty 500

## 2021-07-30 NOTE — Progress Notes (Signed)
°  Transition of Care [facility]) Screening Note   Patient Details  Name: [name] Date of Birth: Oct 23, 1960   Transition of Care [facility]) CM/SW Contact:    Benard Halsted, LCSW Phone Number: 07/30/2021, 9:14 AM    Transition of Care Department [facility]) has reviewed patient and no TOC needs have been identified at this time. We will continue to monitor patient advancement through interdisciplinary progression rounds. If new patient transition needs arise, please place a TOC consult.

## 2021-07-30 NOTE — TOC CAGE-AID Note (Signed)
Transition of Care [facility]) - CAGE-AID Screening   Patient Details  Name: [name] MRN: [id number] Date of Birth: Sep 07, 1960  Transition of Care [facility]) CM/SW Contact:    Bethann Berkshire, [place] Phone Number: 07/30/2021, 11:18 AM   Clinical Narrative:  CAGE-AID completed; score of 0. Pt denies alcohol or any other substance use.   CAGE-AID Screening:    Have You Ever Felt You Ought to Cut Down on Your Drinking or Drug Use?: No Have People Annoyed You By Critizing Your Drinking Or Drug Use?: No Have You Felt Bad Or Guilty About Your Drinking Or Drug Use?: No Have You Ever Had a Drink or Used Drugs First Thing In The Morning to Steady Your Nerves or to Get Rid of a Hangover?: No CAGE-AID Score: 0  Substance Abuse Education Offered: No

## 2021-07-30 NOTE — Progress Notes (Signed)
° °  Inpatient Rehab Admissions Coordinator :  Per therapy recommendations patient was screened for CIR candidacy by Danne Baxter RN MSN. Patient is not yet at a level to tolerate the intensity required to pursue a CIR admit due to bed level assessment. Patient may have the potential to progress to become a candidate. The CIR admissions team will follow and monitor for progress and place a Rehab Consult order if felt to be appropriate. Please contact me with any questions.  Danne Baxter RN MSN Admissions Coordinator [phone]

## 2021-07-30 NOTE — Evaluation (Signed)
Occupational Therapy Evaluation Patient Details Name: [name] MRN: [id number] DOB: 01/04/1961 Today's Date: 07/30/2021   History of Present Illness 61 yo female presenting to ED on 1/14 with nausea and vomiting when standing up. 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 1/16 CTH shows worsening cerebellar edema with potential petechial hemorrhage; worsening mass effect in right posterior fossa and new right-to-left midline shift. PMH including hyperlipidemia.   Clinical Impression   PTA, pt was living with her mother and was independent and working. Eval limited to bed level as MD verbal orders. Currently, requiring Mod A for UB ADLs, Max A for LB ADLs, and Min A for bed mobility. Pt presenting with dizziness impacting her sitting balance and activity tolerance. Pt also with flat affect and decreased cognition. Due to patient's age, motivation, and PLOF, recommend dc to [facility] for intensive OT and will continue to follow acutely as admitted.      Recommendations for follow up therapy are one component of a multi-disciplinary discharge planning process, led by the attending physician.  Recommendations may be updated based on patient status, additional functional criteria and insurance authorization.   Follow Up Recommendations  Acute inpatient rehab (3hours/day)    Assistance Recommended at Discharge Frequent or constant Supervision/Assistance  Patient can return home with the following A lot of help with walking and/or transfers;A lot of help with bathing/dressing/bathroom    Functional Status Assessment  Patient has had a recent decline in their functional status and demonstrates the ability to make significant improvements in function in a reasonable and predictable amount of time.  Equipment Recommendations  BSC/3in1;Other (comment) (RW; pending progress)    Recommendations for Other Services        Precautions / Restrictions Precautions Precautions: Fall Precaution Comments: Per RN, MD does not want pt mobilizing OOB today, eval EOB only. Restrictions Weight Bearing Restrictions: No      Mobility Bed Mobility Overal bed mobility: Needs Assistance Bed Mobility: Supine to Sit;Sit to Supine     Supine to sit: Min assist;HOB elevated;+2 for safety/equipment Sit to supine: Min assist;HOB elevated;+2 for safety/equipment   General bed mobility comments: assist for sequencing, truncal support, HHA to pull to sitting    Transfers                   General transfer comment: deferred given MD request for bed-level eval      Balance Overall balance assessment: Needs assistance Sitting-balance support: No upper extremity supported;Feet supported Sitting balance-Leahy Scale: Fair Sitting balance - Comments: min AP and lateral unsteadiness, pt unaware       Standing balance comment: nt                           ADL either performed or assessed with clinical judgement   ADL Overall ADL's : Needs assistance/impaired Eating/Feeding: Minimal assistance;Bed level   Grooming: Minimal assistance;Bed level   Upper Body Bathing: Moderate assistance;Sitting   Lower Body Bathing: Maximal assistance;Bed level;Sitting/lateral leans   Upper Body Dressing : Moderate assistance;Sitting   Lower Body Dressing: Maximal assistance;Sitting/lateral leans;Bed level                 General ADL Comments: Limited session as MD did not want patient out of bed. Pt sitting at EOB and reporting dizziness and difficulty performing tracking task at EOB     Vision Baseline Vision/History: 1 Wears  glasses ("all the time" near sighted) Patient Visual Report: No change from baseline Vision Assessment?: Yes Eye Alignment: Within Functional Limits Tracking/Visual Pursuits: Impaired - to be further tested in functional context (Noting pulsating (nystamus?) to L  gaze) Convergence: Impaired (comment) (R eye not converging with L)     Perception     Praxis      Pertinent Vitals/Pain Pain Assessment: Faces Faces Pain Scale: Hurts a little bit Pain Location: head Pain Descriptors / Indicators: Headache Pain Intervention(s): Monitored during session;Limited activity within patient's tolerance;Repositioned     Hand Dominance Right   Extremity/Trunk Assessment Upper Extremity Assessment Upper Extremity Assessment: Generalized weakness   Lower Extremity Assessment Lower Extremity Assessment: Defer to PT evaluation   Cervical / Trunk Assessment Cervical / Trunk Assessment: Normal   Communication Communication Communication: Other (comment) (soft spoken)   Cognition Arousal/Alertness: Awake/alert Behavior During Therapy: Flat affect Overall Cognitive Status: Impaired/Different from baseline Area of Impairment: Attention;Following commands;Safety/judgement;Problem solving                   Current Attention Level: Sustained   Following Commands: Follows one step commands with increased time Safety/Judgement: Decreased awareness of deficits   Problem Solving: Slow processing;Decreased initiation;Difficulty sequencing;Requires verbal cues;Requires tactile cues General Comments: Pt requires cues to attend to mobility tasks, lacks body awareness and requires sequencing/form cues throughout mobility     General Comments  VSS on RA    Exercises     Shoulder Instructions      Home Living Family/patient expects to be discharged to:: Private residence Living Arrangements: Parent (Mother) Available Help at Discharge: Family;Available 24 hours/day Type of Home: Apartment Home Access: Level entry     Home Layout: One level     Bathroom Shower/Tub: Teacher, early years/pre: Standard     Home Equipment: None   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      Prior Functioning/Environment Prior Level  of Function : Independent/Modified Independent               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        OT Problem List: Decreased strength;Decreased range of motion;Decreased activity tolerance;Impaired balance (sitting and/or standing);Decreased knowledge of use of DME or AE;Decreased knowledge of precautions      OT Treatment/Interventions: Self-care/ADL training;Therapeutic exercise;Energy conservation;DME and/or AE instruction;Therapeutic activities;Patient/family education    OT Goals(Current goals can be found in the care plan section) Acute Rehab OT Goals Patient Stated Goal: Get back to normal OT Goal Formulation: With patient Time For Goal Achievement: 08/13/21 Potential to Achieve Goals: Good ADL Goals Pt Will Perform Upper Body Dressing: with set-up;with supervision;sitting Pt Will Perform Lower Body Dressing: with min guard assist;sit to/from stand Pt Will Transfer to Toilet: with min assist;ambulating;bedside commode Pt Will Perform Toileting - Clothing Manipulation and hygiene: with min assist;sitting/lateral leans;sit to/from stand  OT Frequency: Min 2X/week    Co-evaluation PT/OT/SLP Co-Evaluation/Treatment: Yes Reason for Co-Treatment: For patient/therapist safety;To address functional/ADL transfers PT goals addressed during session: Mobility/safety with mobility;Balance OT goals addressed during session: ADL's and self-care      AM-PAC OT "6 Clicks" Daily Activity     Outcome Measure Help from another person eating meals?: A Little Help from another person taking care of personal grooming?: A Little Help from another person toileting, which includes using toliet, bedpan, or urinal?: A Lot Help from another person bathing (including washing, rinsing, drying)?: A Lot Help from another person to put  on and taking off regular upper body clothing?: A Little Help from another person to put on and taking off regular lower body clothing?:  A Lot 6 Click Score: 15   End of Session Nurse Communication: Mobility status  Activity Tolerance: Patient tolerated treatment well Patient left: in bed;with call bell/phone within reach;with bed alarm set  OT Visit Diagnosis: Muscle weakness (generalized) (M62.81);Dizziness and giddiness (R42)                Time: 8307-4600 OT Time Calculation (min): 14 min Charges:  OT General Charges $OT Visit: 1 Visit OT Evaluation $OT Eval Moderate Complexity: [place], OTR/L Acute Rehab Pager: [phone] Office: [place] 07/30/2021, 1:47 PM

## 2021-07-30 NOTE — Consult Note (Signed)
NAME:  ARAMIS ZOBEL, MRN:  [id number], DOB:  1961-02-26, LOS: 1 ADMISSION DATE:  07/28/2021, CONSULTATION DATE: July 30, 2021 REFERRING MD: Teaching service, CHIEF COMPLAINT: Dizziness  History of Present Illness:  Patient is 61 year old old African-American lady who presents with unsteady gait dizziness and vomiting she was admitted with the above in the ED she had a CAT scan without acute abnormality MRI was done and showed right cerebellar stroke.  Patient started having more weakness and dizziness follow-up CT scan showed patient has more swelling and midline shift that was communicated to the primary team and eventually to neuro conditions was made to start the patient on hypertonic saline to get the sodium up to 150-155. Objective   Blood pressure 120/67, pulse (!) 54, temperature 98.1 F (36.7 C), temperature source Temporal, resp. rate 11, height 5' 3.5" (1.613 m), weight 68.9 kg, last menstrual period 07/15/2009, SpO2 98 %.        Intake/Output Summary (Last 24 hours) at 07/30/2021 0141 Last data filed at 07/29/2021 0337 Gross per 24 hour  Intake 500 ml  Output --  Net 500 ml   Filed Weights   07/28/21 1928  Weight: 68.9 kg    Examination: General: Very dizzy closing her eyes no nystagmus. Neuro: Very unsteady ataxic overshooting on the finger-nose test HEENT:  atraumatic , no jaundice , dry mucous membranes  Cardiovascular: Normal rate and rhythm no murmurs Lungs:  CTA bilateral , no wheezing or crackles  Abdomen:  Soft lax +BS , no tenderness . 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   New midline shift with swelling cerebellar start the patient on hypertonic saline get the sodium up to 150 and 155 repeat CAT scan in 6 hours or if any clinical change.   Avoid anticoagulation  given petechial hemorrhages on the cerebellar area  Frequent neurochecks and if any change in the mental status repeat CAT scan stat basis  Labs   CBC: Recent Labs  Lab 07/28/21 2212  WBC 9.0  NEUTROABS 7.2  HGB 14.0  HCT 42.5  MCV 89.9  PLT 761    Basic Metabolic Panel: Recent Labs  Lab 07/28/21 2322  NA 140  K 4.0  CL 107  CO2 23  GLUCOSE 125*  BUN 10  CREATININE 0.58  CALCIUM 8.5*   GFR: Estimated Creatinine Clearance: 72.2 mL/min (by C-G formula based on SCr of 0.58 mg/dL). Recent Labs  Lab 07/28/21 2212  WBC 9.0    Liver Function Tests: Recent Labs  Lab 07/28/21 2322  AST 16  ALT 11  ALKPHOS 63  BILITOT 0.5  PROT 7.0  ALBUMIN 3.7   Recent Labs  Lab 07/28/21 2322  LIPASE 10*   No results for input(s): AMMONIA in the last 168 hours.  ABG No results found for: PHART, PCO2ART, PO2ART, HCO3, TCO2, ACIDBASEDEF, O2SAT   Coagulation Profile: No results for input(s): INR, PROTIME in the last 168 hours.  Cardiac Enzymes: No results for input(s): CKTOTAL, CKMB, CKMBINDEX, TROPONINI in the last 168 hours.  HbA1C: Hgb A1c MFr Bld  Date/Time Value Ref Range Status  07/29/2021 10:48 AM 6.1 (H) 4.8 - 5.6 % Final    Comment:    (NOTE) Pre diabetes:          5.7%-6.4%  Diabetes:              >  6.4%  Glycemic control for   <7.0% adults with diabetes     CBG: No results for input(s): GLUCAP in the last 168 hours.  Review of Systems:   Unable to obtain due to patient condition   Past Medical History:  She,  has a past medical history of Blood in stool, Hyperlipidemia, and Vaginal fibroids.   Surgical History:  History reviewed. No pertinent surgical history.   Social History:   reports that she has quit smoking. She does not have any smokeless tobacco history on file. She reports that she does not drink alcohol and does not use drugs.   Family History:  Her family history includes Diabetes in her father; Heart disease in her father;  Hyperlipidemia in her father; Hypertension in her father; Stroke in her father. There is no history of Cancer.   Allergies Allergies  Allergen Reactions   Amoxicillin-Pot Clavulanate Itching and Rash     Home Medications  Prior to Admission medications   Medication Sig Start Date End Date Taking? Authorizing Provider  Aspirin-Acetaminophen-Caffeine (GOODY HEADACHE PO) Take 1 Dose by mouth every 6 (six) hours as needed (pain, headache).   Yes [provider]  ondansetron (ZOFRAN-ODT) 4 MG disintegrating tablet Take 1 tablet (4 mg total) by mouth every 8 (eight) hours as needed for nausea or vomiting. Patient not taking: Reported on 07/29/2021 07/28/21   Raspet, Derry Skill, PA-C     Critical care time: Critical care time spent the care of this patient 38 minutes

## 2021-07-30 NOTE — Evaluation (Signed)
Physical Therapy Evaluation Patient Details Name: [name] MRN: [id number] DOB: July 25, 1960 Today's Date: 07/30/2021  History of Present Illness  61 yo female presenting to ED on 1/14 with nausea and vomiting when standing up. 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 1/16 CTH shows worsening cerebellar edema with potential petechial hemorrhage; worsening mass effect in right posterior fossa and new right-to-left midline shift. PMH including hyperlipidemia.  Clinical Impression   Pt presents with incoordination dynamically, dizziness and nausea with bed-level mobility, impaired balance, impaired awareness and attention, and decreased activity tolerance vs baseline. Pt to benefit from acute PT to address deficits. Pt overall min +2 for bed-level mobility, further mobility deferred secondary to MD verbal orders. PT to progress mobility as tolerated, and will continue to follow acutely.         Recommendations for follow up therapy are one component of a multi-disciplinary discharge planning process, led by the attending physician.  Recommendations may be updated based on patient status, additional functional criteria and insurance authorization.  Follow Up Recommendations Acute inpatient rehab (3hours/day)    Assistance Recommended at Discharge    Patient can return home with the following  A lot of help with walking and/or transfers;A lot of help with bathing/dressing/bathroom;Help with stairs or ramp for entrance;Direct supervision/assist for financial management;Direct supervision/assist for medications management    Equipment Recommendations Other (comment) (tbd)  Recommendations for Other Services       Functional Status Assessment Patient has had a recent decline in their functional status and demonstrates the ability to make significant improvements in function in a reasonable and predictable amount  of time.     Precautions / Restrictions Precautions Precautions: Fall Precaution Comments: Per RN, MD does not want pt mobilizing OOB today, eval EOB only. Restrictions Weight Bearing Restrictions: No      Mobility  Bed Mobility Overal bed mobility: Needs Assistance Bed Mobility: Supine to Sit;Sit to Supine     Supine to sit: Min assist;HOB elevated;+2 for safety/equipment Sit to supine: Min assist;HOB elevated;+2 for safety/equipment   General bed mobility comments: assist for sequencing, truncal support, HHA to pull to sitting    Transfers                   General transfer comment: deferred given MD request for bed-level eval    Ambulation/Gait                  Stairs            Wheelchair Mobility    Modified Rankin (Stroke Patients Only) Modified Rankin (Stroke Patients Only) Pre-Morbid Rankin Score: No symptoms Modified Rankin: Moderately severe disability     Balance Overall balance assessment: Needs assistance Sitting-balance support: No upper extremity supported;Feet supported Sitting balance-Leahy Scale: Fair Sitting balance - Comments: min AP and lateral unsteadiness, pt unaware       Standing balance comment: nt                             Pertinent Vitals/Pain Pain Assessment: Faces Faces Pain Scale: Hurts a little bit Pain Location: head Pain Descriptors / Indicators: Headache Pain Intervention(s): Limited activity within patient's tolerance;Monitored during session    Home Living Family/patient expects to be discharged to:: Private residence Living Arrangements: Parent (Mother) Available Help at Discharge: Family;Available 24 hours/day Type of Home: Apartment Home Access: Level entry  Home Layout: One level Home Equipment: None 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    Prior Function Prior Level of Function : Independent/Modified Independent               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     Hand Dominance   Dominant Hand: Right    Extremity/Trunk Assessment   Upper Extremity Assessment Upper Extremity Assessment: Defer to OT evaluation    Lower Extremity Assessment Lower Extremity Assessment: Overall WFL for tasks assessed (heel-to-shin testing WFL bilat, modified in supine)    Cervical / Trunk Assessment Cervical / Trunk Assessment: Normal  Communication   Communication: Other (comment) (soft spoken)  Cognition Arousal/Alertness: Awake/alert Behavior During Therapy: Flat affect Overall Cognitive Status: Impaired/Different from baseline Area of Impairment: Attention;Following commands;Safety/judgement;Problem solving                   Current Attention Level: Sustained   Following Commands: Follows one step commands with increased time Safety/Judgement: Decreased awareness of deficits   Problem Solving: Slow processing;Decreased initiation;Difficulty sequencing;Requires verbal cues;Requires tactile cues General Comments: Pt requires cues to attend to mobility tasks, lacks body awareness and requires sequencing/form cues throughout mobility        General Comments      Exercises     Assessment/Plan    PT Assessment Patient needs continued PT services  PT Problem List Decreased mobility;Decreased safety awareness;Decreased activity tolerance;Decreased coordination;Decreased balance;Pain;Decreased cognition       PT Treatment Interventions DME instruction;Therapeutic activities;Gait training;Therapeutic exercise;Patient/family education;Balance training;Neuromuscular re-education;Functional mobility training;Stair training    PT Goals (Current goals can be found in the Care Plan section)  Acute Rehab PT Goals PT Goal Formulation: With patient Time For Goal Achievement: 08/13/21 Potential to Achieve Goals: Good    Frequency Min 4X/week     Co-evaluation PT/OT/SLP Co-Evaluation/Treatment:  Yes Reason for Co-Treatment: For patient/therapist safety;To address functional/ADL transfers PT goals addressed during session: Mobility/safety with mobility;Balance OT goals addressed during session: ADL's and self-care       AM-PAC PT "6 Clicks" Mobility  Outcome Measure Help needed turning from your back to your side while in a flat bed without using bedrails?: A Little Help needed moving from lying on your back to sitting on the side of a flat bed without using bedrails?: A Little Help needed moving to and from a bed to a chair (including a wheelchair)?: A Lot Help needed standing up from a chair using your arms (e.g., wheelchair or bedside chair)?: A Lot Help needed to walk in hospital room?: A Lot Help needed climbing 3-5 steps with a railing? : Total 6 Click Score: 13    End of Session   Activity Tolerance: Patient tolerated treatment well Patient left: in bed;with call bell/phone within reach;with bed alarm set Nurse Communication: Mobility status PT Visit Diagnosis: Other abnormalities of gait and mobility (R26.89);Ataxic gait (R26.0)    Time: 6606-3016 PT Time Calculation (min) (ACUTE ONLY): 16 min   Charges:   PT Evaluation $PT Eval Low Complexity: 1 Low          Rayma Hegg S, PT DPT Acute Rehabilitation Services Pager [phone]  Office [phone]   [place] E Ruffin Pyo 07/30/2021, 11:25 AM

## 2021-07-30 NOTE — Progress Notes (Addendum)
PCCM Interval Note   10 yoF who presented 1/15 with ataxa, dizziness, and vomiting admitted acute infarcts in the right cerebellar stroke and left occipital cortex.    Noted to have worsening cerebral edema overnight with possible petechial hemorrhage started on HTS overnight (3% at 75 ml/hr via PIV).  ASA held.  Repeat CTH at 0600 without change.   She currently denies any headache, nausea, or dizziness.   Blood pressure (!) 121/58, pulse 61, temperature 98.3 F (36.8 C), temperature source Oral, resp. rate 15, height 5' 3.5" (1.613 m), weight 67.6 kg, last menstrual period 07/15/2009, SpO2 98 %. Afebrile.    She remains lethargic but will easily awaken to verbal, oriented x 3- slowed responses, MAE, non focal, pupils 4/reactive.  Na 138 > 144   Acute right cerebellar stroke and left occipital cortex Cerebral Edema  P:  Continue frequent neuro checks  Ongoing HTS per neurology, currently at 75 ml/hr Na checks q6hrs Further imaging and stroke workup per neurology- next [facility] at 1800 If worsening neuro status, repeat CTH and likely NSGY consult      Kennieth Rad, ACNP [facility] 07/30/2021, 9:14 AM  See Amion for pager If no response to pager, please call PCCM consult pager After 7:00 pm call Elink

## 2021-07-30 NOTE — Progress Notes (Addendum)
Interval Progress Note:   Repeat CT head with worsening mass effect in right posterior fossa and new right-to-left midline shift. Discussed with RN, [place]. She has noticed significant ataxia and persistent headache but no other changes noted. Discussed with Dr. Rory Percy, who recommends initiating hypertonic saline for elevated ICP. Goal sodium 150-155. Will repeat CT head at 6 am. If any clinic changes occur, will need to obtain stat CT head.  ADDENDUM:  Hypertonic saline only managed in the ICU. Spoke with Luberta Mutter with PCCM. Transfer orders placed.   Dr. Jose Persia Internal Medicine PGY-3  Pager: [phone] After 5pm on weekdays and 1pm on weekends: On Call pager [phone]  07/30/2021, 12:30 AM

## 2021-07-30 NOTE — Progress Notes (Addendum)
STROKE TEAM PROGRESS NOTE   INTERVAL HISTORY Patient's RN at bedside. Patient reports she had felt dizzy Saturday when she woke up like the room was spinning. Patient states she had also felt nauseated and vomited at home. Patient states this has improved since being in the hospital at least when she is not moving around. Patient denies any sensory or motor deficits.   CT Head without acute abnormality. MRI showed acute infarcts in R cerebellum and L occipital cortex. CT angio showed high grade narrowing of non dominant R vetebral artery 70% at origin and more at the V2 segment as well as 55% narrowing of L vertebral artery. Repeat CT last night indicated increased edema and heterogeneity in R cerebellar hemisphere in infarcting tissue w/ new scattered petechial hemorrhaging as well as increased R posterior fossa mass effect with effaced R cerebellomedullary cistern and partially effaced right CP angle cistern, 5 mm R to L midline shift. Because of increased edema, patient was started on hypertonic saline.   Repeat head CT this morning did not show interval hemorrhage or visible infarct progression. No hydrocephalus. Patient reports she currently is not experiencing any neurological deficits while she is sitting in the bed. Patient denies feeling nauseous.  Discussed plan to continue hypertonic saline and very close neurological monitoring.  In case she has neurological worsening may need emergent craniotomy control cytotoxic edema with neurosurgery consult.  Discussed we would re-order CT for 6 PM or emergently should patient's mental status alters as well as NSGY consult for possible suboccipital crani if shows worsening. Patient verbalized understanding of plan and had no other questions at this time.  Vitals:   07/30/21 0700 07/30/21 0755 07/30/21 0800 07/30/21 0900  BP: 135/61  (!) 140/57 (!) 121/58  Pulse: (!) 55  61 64  Resp: 11  15 13   Temp:  98.3 F (36.8 C)    TempSrc:  Oral    SpO2: 98%   98% 98%  Weight:      Height:       CBC:  Recent Labs  Lab 07/28/21 2212 07/30/21 0626  WBC 9.0 8.0  NEUTROABS 7.2  --   HGB 14.0 13.5  HCT 42.5 40.9  MCV 89.9 91.5  PLT 206 025   Basic Metabolic Panel:  Recent Labs  Lab 07/30/21 0626 07/30/21 0956  NA 144 143  K 3.9 4.0  CL 109 112*  CO2 26 25  GLUCOSE 95 99  BUN 10 9  CREATININE 0.82 0.73  CALCIUM 8.9 8.6*   Lipid Panel:  Recent Labs  Lab 07/29/21 1048  CHOL 215*  TRIG 76  HDL 39*  CHOLHDL 5.5  VLDL 15  LDLCALC 161*   HgbA1c:  Recent Labs  Lab 07/29/21 1048  HGBA1C 6.1*   Urine Drug Screen: No results for input(s): LABOPIA, COCAINSCRNUR, LABBENZ, AMPHETMU, THCU, LABBARB in the last 168 hours.  Alcohol Level No results for input(s): ETH in the last 168 hours.  IMAGING past 24 hours CT ANGIO HEAD NECK W WO CM  Result Date: 07/29/2021 CLINICAL DATA:  Stroke, determine embolic source. EXAM: CT ANGIOGRAPHY HEAD AND NECK TECHNIQUE: Multidetector CT imaging of the head and neck was performed using the standard protocol during bolus administration of intravenous contrast. Multiplanar CT image reconstructions and MIPs were obtained to evaluate the vascular anatomy. Carotid stenosis measurements (when applicable) are obtained utilizing NASCET criteria, using the distal internal carotid diameter as the denominator. RADIATION DOSE REDUCTION: This exam was performed according to the departmental dose-optimization  program which includes automated exposure control, adjustment of the mA and/or kV according to patient size and/or use of iterative reconstruction technique. CONTRAST:  19mL OMNIPAQUE IOHEXOL 350 MG/ML SOLN COMPARISON:  Head CT and brain MRI from earlier the same day FINDINGS: CTA NECK FINDINGS Aortic arch: Atheromatous plaque.  Three vessel branching. Right carotid system: Atheromatous plaque at the bifurcation. No stenosis or ulceration. Left carotid system: Mild atheromatous plaque at the bifurcation. No  stenosis or ulceration. Vertebral arteries: No proximal subclavian stenosis. 70% stenosis at the right vertebral origin and even greater at the right V2 segment. 55% stenosis at the left vertebral origin as measured on sagittal reformats. No beading or discrete ulceration. Skeleton: No acute finding Other neck: No acute finding Upper chest: Clear apical lungs Review of the MIP images confirms the above findings CTA HEAD FINDINGS Anterior circulation: Negative for branch occlusion, beading, or aneurysm. Limited by venous contamination Posterior circulation: Negative for branch occlusion, beading, or aneurysm. Limited by venous contamination Venous sinuses: Diffusely patent Anatomic variants: None significant Review of the MIP images confirms the above findings IMPRESSION: 1. 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 2. 55% narrowing at the left vertebral origin. 3. No arterial ulceration or beading. Electronically Signed   By: Jorje Guild M.D.   On: 07/29/2021 12:01   CT HEAD WO CONTRAST (5MM)  Result Date: 07/30/2021 CLINICAL DATA:  Stroke follow-up. EXAM: CT HEAD WITHOUT CONTRAST TECHNIQUE: Contiguous axial images were obtained from the base of the skull through the vertex without intravenous contrast. RADIATION DOSE REDUCTION: This exam was performed according to the departmental dose-optimization program which includes automated exposure control, adjustment of the mA and/or kV according to patient size and/or use of iterative reconstruction technique. COMPARISON:  CT from yesterday FINDINGS: Brain: Known acute infarcts in the right cerebellum and left occipital lobe. Posterior fossa mass effect and fourth ventricular crowding but no hydrocephalus. Chronic small vessel ischemia in the hemispheric white matter. No hematoma or visible interval infarct. Vascular: No hyperdense vessel or unexpected calcification. Skull:  Normal. Negative for fracture or focal lesion. Sinuses/Orbits: No acute finding. IMPRESSION: No interval hemorrhage or visible infarct progression. No hydrocephalus. Electronically Signed   By: Jorje Guild M.D.   On: 07/30/2021 06:10   CT HEAD WO CONTRAST (5MM)  Result Date: 07/30/2021 CLINICAL DATA:  Follow-up left occipital, right cerebellar infarctions. EXAM: CT HEAD WITHOUT CONTRAST TECHNIQUE: Contiguous axial images were obtained from the base of the skull through the vertex without intravenous contrast. RADIATION DOSE REDUCTION: This exam was performed according to the departmental dose-optimization program which includes automated exposure control, adjustment of the mA and/or kV according to patient size and/or use of iterative reconstruction technique. COMPARISON:  MRI brain today at 8:07 a.m., head CT today at 2:04 a.m. FINDINGS: Brain: In the mid to lower right cerebellar hemisphere there is increasing edema consistent with liquefaction necrosis and increasing heterogeneous hypodense parenchymal attenuation. There is increased mass effect in the posterior fossa noted with an effaced right cerebellomedullary cistern and a partially effaced right cerebellopontine angle cistern, as well as posterior fossa right-to-left midline shift up to 5 mm and moderate effacement of fourth ventricle in comparison to the earlier studies There are several scattered punctate foci of petechial hemorrhage within the infarcting tissue. Patchy cortical infarction along the medial and posterior left occipital cortex was better seen on MRI but probably is not significantly changed. There is no  extra-axial hemorrhage no appreciable new cortical based infarct. There is slight cortical atrophy of the cerebral hemispheres and mild-to-moderate small-vessel disease the cerebral white matter. There is no supratentorial midline shift or mass effect. No hydrocephalus. Vascular: No hyperdense vessel or unexpected calcification.  Skull: Normal. Negative for fracture or focal lesion. Sinuses/Orbits: No acute finding. Other: None. IMPRESSION: 1. Increased edema and heterogeneity in the right cerebellar hemisphere in the infarcting tissue with new scattered petechial hemorrhaging. New extra-axial bleed is visible. 2. Increased right posterior fossa mass effect with effaced right cerebellomedullary cistern and partially effaced right CP angle cistern, 5 mm right-to-left posterior fossa midline shift, and moderate effacement of the fourth ventricle. There is no increased upstream hydrocephalus. 3. Patchy left occipital cortical infarction is not as well seen as on MRI but probably unchanged. There is no associated significant mass effect. Electronically Signed   By: Telford Nab M.D.   On: 07/30/2021 00:07    PHYSICAL EXAM  Temp:  [98.3 F (36.8 C)-98.5 F (36.9 C)] 98.3 F (36.8 C) (01/16 0755) Pulse Rate:  [52-77] 64 (01/16 0900) Resp:  [11-18] 13 (01/16 0900) BP: (115-140)/(56-69) 121/58 (01/16 0900) SpO2:  [96 %-100 %] 98 % (01/16 0900) Weight:  [67.6 kg] 67.6 kg (01/16 0428)  General - Well nourished, well developed pleasant middle-aged African-American lady, in no apparent distress.  Ophthalmologic - fundi not visualized due to noncooperation.  Cardiovascular - Regular rhythm and rate.  Mental Status -  Level of arousal and orientation to time, place, and person were intact. Language including expression, naming, repetition, comprehension was assessed and found intact. Attention span and concentration were normal. Recent and remote memory were intact. Fund of Knowledge was assessed and was intact.  Cranial Nerves II - XII - II - Visual field intact OU. III, IV, VI - Extraocular movements intact.  Mild saccadic dysmetria on lateral gaze V - Facial sensation intact bilaterally. VII - Facial movement intact bilaterally. VIII - Hearing & vestibular intact bilaterally. X - Palate elevates symmetrically. XI -  Chin turning & shoulder shrug intact bilaterally. XII - Tongue protrusion intact.  Motor Strength - The patients strength was normal in all extremities and pronator drift was absent.  Bulk was normal and fasciculations were absent.   Motor Tone - Muscle tone was assessed at the neck and appendages and was normal.  Reflexes - The patients reflexes were symmetrical in all extremities and she had no pathological reflexes.  Sensory - Light touch, temperature/pinprick were assessed and were symmetrical.    Coordination - The patient had normal movements in the hands and feet with no ataxia or dysmetria.  Tremor was absent.  Gait and Station - deferred.  ASSESSMENT/PLAN [name] is a 61 y.o. female with history of HLD, prediabetes presenting with N/V and dizziness since Saturday.   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 Mild chronic white matter ischemic changes CTA head & neck High grade narrowing of non dominant R vertebral artery 70% at origin and even greater at V2 segment, 55% narrowing of L vertebral origin. MRI  Acute infarcts in R cerebellum and L occipital cortex 2D Echo Bubble Study pending 1/15 Repeat CT indicated increased edema and heterogeneity in R cerebellar hemisphere in infarcting tissue w/ new scattered petechial hemorrhaging as well as increased R posterior fossa mass effect with effaced R cerebellomedullary  cistern and partially effaced right CP angle cistern, 5 mm R to L midline shift. 1/16 6:00 AM Repeat head CT this morning did not show interval hemorrhage or visible infarct progression. No hydrocephalus. LDL 161 HgbA1c 6.1 VTE prophylaxis - SCD    Diet   Diet regular Room service appropriate? Yes with Assist; Fluid consistency: Thin   No antithrombotic prior to admission, now on No  antithrombotic. Holding ASA due to petechial hemorrhage and possible need for  Therapy recommendations:  CIR Disposition:  pending Frequent neuro checks. If patient's condition acutely worsens, stat CT and NSGY consult for possible subocciptal crani On hypertonic saline to reduce edema in brain  Hyperlipidemia Home meds:  none LDL 161, goal < 70 Crestor 20 mg added this admission  Continue statin at discharge  Other Stroke Risk Factors Cigarette smoker advised to stop smoking   Hospital day # 1  France Ravens, MD PGY1 Resident STROKE MD NOTE :  I have personally obtained history,examined this patient, reviewed notes, independently viewed imaging studies, participated in medical decision making and plan of care.ROS completed by me personally and pertinent positives fully documented  I have made any additions or clarifications directly to the above note. Agree with note above.  Patient presented with dizziness, nausea and gait ataxia secondary to large right posterior inferior cerebellar artery infarct and MRI also shows in addition left anterior occipital and PCA territory infarct.  CT angiogram shows 70% narrowing at the origin of the nondominant right vertebral artery unclear if this is symptomatic or patient has bilateral emboli cardio embolic etiology.  Recommend close neurological monitoring and treat cytotoxic edema with hypertonic saline with serum sodium goal between 150-155.  Her neurological exam fortunately looks a lot better than her CT scan which shows progressive cytotoxic edema with effacement of fourth ventricle or hydrocephalus.  Repeat CT scan this evening and if there is clinical or radiological worsening may need to consider urgent neurosurgery consult for posterior fossa likely compression.  Continue aspirin alone for now due to petechial hemorrhage on the follow-up CT scan.  Continue ongoing stroke work-up and aggressive risk factor modification.This patient is critically ill  and at significant risk of neurological worsening, death and care requires constant monitoring of vital signs, hemodynamics,respiratory and cardiac monitoring, extensive review of multiple databases, frequent neurological assessment, discussion with family, other specialists and medical decision making of high complexity.I have made any additions or clarifications directly to the above note.This critical care time does not reflect procedure time, or teaching time or supervisory time of PA/NP/Med Resident etc but could involve care discussion time.  I spent 30 minutes of neurocritical care time  in the care of  this patient.   Antony Contras, MD Medical Director [facility] Pager: [phone] 07/30/2021 1:40 PM  To contact Stroke Continuity provider, please refer to [email or web address] After hours, contact General Neurology

## 2021-07-30 NOTE — Plan of Care (Signed)
On-call coverage note   Kimble completed IMPRESSION: 1. Increased edema and heterogeneity in the right cerebellar hemisphere in the infarcting tissue with new scattered petechial hemorrhaging. New extra-axial bleed is visible. 2. Increased right posterior fossa mass effect with effaced right cerebellomedullary cistern and partially effaced right CP angle cistern, 5 mm right-to-left posterior fossa midline shift, and moderate effacement of the fourth ventricle. There is no increased upstream hydrocephalus. 3. Patchy left occipital cortical infarction is not as well seen as on MRI but probably unchanged. There is no associated significant mass effect.    Worsening cerebral edema-cerbellar with potential petechial hemorrhage. No hydrocephalus at this time.  Start 3% saline (ordered) Check CT head without at 0600 CT sooner if neurological worsening. No signs of hydrocephalus yet on CT. May need to engage NSGY for possible suboccipital crani if subsequent scan shows worsening. Due to petechial hemorrhage and possible need for hemicrani - d/c ASA for now.    Plan d/w IM res Dr.  Marland Kitchen Amie [place], MD Neurologist [facility] Pager: [phone]

## 2021-07-30 NOTE — Progress Notes (Signed)
°  Echocardiogram 2D Echocardiogram with a bubble studdy has been performed.  Darlina Sicilian M 07/30/2021, 10:38 AM

## 2021-07-31 DIAGNOSIS — I639 Cerebral infarction, unspecified: Secondary | ICD-10-CM | POA: Diagnosis not present

## 2021-07-31 LAB — SODIUM
Sodium: 147 mmol/L — ABNORMAL HIGH (ref 135–145)
Sodium: 149 mmol/L — ABNORMAL HIGH (ref 135–145)
Sodium: 147 mmol/L — ABNORMAL HIGH (ref 135–145)
Sodium: 144 mmol/L (ref 135–145)

## 2021-07-31 LAB — CBC
HCT: 36.3 % (ref 36.0–46.0)
Hemoglobin: 11.8 g/dL — ABNORMAL LOW (ref 12.0–15.0)
MCH: 30.2 pg (ref 26.0–34.0)
MCHC: 32.5 g/dL (ref 30.0–36.0)
MCV: 92.8 fL (ref 80.0–100.0)
Platelets: 179 10*3/uL (ref 150–400)
RBC: 3.91 MIL/uL (ref 3.87–5.11)
RDW: 14.8 % (ref 11.5–15.5)
WBC: 6.9 10*3/uL (ref 4.0–10.5)
nRBC: 0 % (ref 0.0–0.2)

## 2021-07-31 MED ORDER — ASPIRIN EC 81 MG PO TBEC
81.0000 mg | DELAYED_RELEASE_TABLET | Freq: Every day | ORAL | Status: DC
  Administered 2021-07-31 – 2021-08-03 (×4): 81 mg via ORAL
  Filled 2021-07-31 (×4): qty 1

## 2021-07-31 NOTE — Progress Notes (Signed)
Physical Therapy Treatment Patient Details Name: [name] MRN: [id number] DOB: 07-15-1961 Today's Date: 07/31/2021   History of Present Illness 61 yo female presenting to ED on 1/14 with nausea and vomiting when standing up. 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 1/16 CTH shows worsening cerebellar edema with potential petechial hemorrhage; worsening mass effect in right posterior fossa and new right-to-left midline shift. PMH including hyperlipidemia.    PT Comments    Pt with continued flat affect, more anxiety about mobility today with UE tremors when preparing to stand. Pt ambulatory for short room distance, demonstrating increasing truncal ataxia with further gait distance and reporting worsening dizziness/nausea. Gaze stabilization on stationary objects encouraged throughout session. AIR remains appropriate disposition, will continue to follow.     Recommendations for follow up therapy are one component of a multi-disciplinary discharge planning process, led by the attending physician.  Recommendations may be updated based on patient status, additional functional criteria and insurance authorization.  Follow Up Recommendations  Acute inpatient rehab (3hours/day)     Assistance Recommended at Discharge Set up Supervision/Assistance  Patient can return home with the following A little help with walking and/or transfers;A little help with bathing/dressing/bathroom;Assistance with cooking/housework;Direct supervision/assist for medications management;Direct supervision/assist for financial management;Assist for transportation;Help with stairs or ramp for entrance   Equipment Recommendations  Other (comment) (tbd)    Recommendations for Other Services       Precautions / Restrictions Precautions Precautions: Fall Restrictions Weight Bearing Restrictions: No     Mobility  Bed  Mobility Overal bed mobility: Needs Assistance Bed Mobility: Supine to Sit, Sit to Supine     Supine to sit: Min assist, HOB elevated, Mod assist     General bed mobility comments: assist for trunk elevation and LE translation to EOB, increased time to steady gaze once sitting EOB    Transfers Overall transfer level: Needs assistance Equipment used: Rolling walker (2 wheels) Transfers: Sit to/from Stand Sit to Stand: Min assist, From elevated surface           General transfer comment: assist to rise and steady, cues for hand placement when rising/sitting.    Ambulation/Gait Ambulation/Gait assistance: Min assist Gait Distance (Feet): 12 Feet Assistive device: Rolling walker (2 wheels) Gait Pattern/deviations: Step-through pattern, Decreased stride length, Trunk flexed, Narrow base of support, Ataxic Gait velocity: decr     General Gait Details: cues for increasing step length, upright posture, assist for steadying and RW management. Pt demonstrating more truncal vs LE ataxia during gait.   Stairs             Wheelchair Mobility    Modified Rankin (Stroke Patients Only) Modified Rankin (Stroke Patients Only) Pre-Morbid Rankin Score: No symptoms Modified Rankin: Moderately severe disability     Balance Overall balance assessment: Needs assistance Sitting-balance support: No upper extremity supported, Feet supported Sitting balance-Leahy Scale: Fair     Standing balance support: Reliant on assistive device for balance, Bilateral upper extremity supported Standing balance-Leahy Scale: Poor                              Cognition Arousal/Alertness: Awake/alert Behavior During Therapy: Flat affect Overall Cognitive Status: Impaired/Different from baseline Area of Impairment: Attention, Following commands, Safety/judgement, Problem solving                   Current Attention Level: Selective  Following Commands: Follows one step  commands with increased time Safety/Judgement: Decreased awareness of deficits   Problem Solving: Slow processing, Decreased initiation, Difficulty sequencing, Requires verbal cues, Requires tactile cues General Comments: Pt requires cues to attend to mobility tasks, lacks body awareness and requires sequencing/form cues throughout mobility        Exercises Other Exercises Other Exercises: cues throughout session for gaze stabilization on stationary object to lessen dizziness and nausea    General Comments        Pertinent Vitals/Pain Pain Assessment Pain Assessment: Faces Faces Pain Scale: No hurt Pain Intervention(s): Monitored during session    Home Living                          Prior Function            PT Goals (current goals can now be found in the care plan section) Acute Rehab PT Goals PT Goal Formulation: With patient Time For Goal Achievement: 08/13/21 Potential to Achieve Goals: Good Progress towards PT goals: Progressing toward goals    Frequency    Min 4X/week      PT Plan Current plan remains appropriate    Co-evaluation              AM-PAC PT "6 Clicks" Mobility   Outcome Measure  Help needed turning from your back to your side while in a flat bed without using bedrails?: A Little Help needed moving from lying on your back to sitting on the side of a flat bed without using bedrails?: A Lot Help needed moving to and from a bed to a chair (including a wheelchair)?: A Lot Help needed standing up from a chair using your arms (e.g., wheelchair or bedside chair)?: A Little Help needed to walk in hospital room?: A Little Help needed climbing 3-5 steps with a railing? : A Lot 6 Click Score: 15    End of Session   Activity Tolerance: Patient tolerated treatment well;Patient limited by fatigue Patient left: with call bell/phone within reach;in chair;with chair alarm set Nurse Communication: Mobility status PT Visit Diagnosis:  Other abnormalities of gait and mobility (R26.89);Ataxic gait (R26.0)     Time: 1256-1316 PT Time Calculation (min) (ACUTE ONLY): 20 min  Charges:  $Therapeutic Activity: 8-22 mins                     Stacie Glaze, PT DPT Acute Rehabilitation Services Pager [phone]  Office [phone]   Roxine Caddy E Ruffin Pyo 07/31/2021, 2:38 PM

## 2021-07-31 NOTE — Progress Notes (Signed)
Upon entrance to room patient complains of swelling and "tightness" in right arm. Assessment consistent with infiltration/extravasation of IV site infusing 3% NaCl. Infusion was stopped, extremity elevated, any fluid able to aspirated was removed. This RN called pharmacy Marya Amsler) and asked about extravasation protocol and order sets was advised to elevate extremity and apply heat pack to assist fluid absorption. IVwatch was in place and monitoring, however failed to alarm or recognize infiltration. This RN removed IV site and applied heat packs to extremity. IV team consult placed to gain ultrasound guided IV access to resume 3% infusion.  Dorma Russell RN

## 2021-07-31 NOTE — Progress Notes (Addendum)
STROKE TEAM PROGRESS NOTE   INTERVAL HISTORY Patient's RN at bedside. Patient continues to deny any emergent neurological symptoms. PT/OT recommend inpatient rehab.   CT last evening showed unchanged appearance of infarcts with no evidence of hemorrhagic transformation or progression of infarcts but persistent brain compression and cytotoxic edema. Plan to begin weaning patient off hypertonic saline.  Vitals:   07/31/21 0800 07/31/21 0900 07/31/21 1000 07/31/21 1100  BP: (!) 147/62 (!) 146/66 (!) 153/69 (!) 159/65  Pulse: (!) 57 (!) 48 (!) 49 (!) 56  Resp: 14 13 14 15   Temp: 98.7 F (37.1 C)     TempSrc: Oral     SpO2: 98% 95% 98% 100%  Weight:      Height:       CBC:  Recent Labs  Lab 07/28/21 2212 07/30/21 0626 07/31/21 0652  WBC 9.0 8.0 6.9  NEUTROABS 7.2  --   --   HGB 14.0 13.5 11.8*  HCT 42.5 40.9 36.3  MCV 89.9 91.5 92.8  PLT 206 207 160    Basic Metabolic Panel:  Recent Labs  Lab 07/30/21 1857 07/30/21 2133 07/31/21 0033 07/31/21 0652  NA 144   143 144 147* 149*  K 3.7 3.9  --   --   CL 114* 115*  --   --   CO2 22 24  --   --   GLUCOSE 158* 119*  --   --   BUN 6 6  --   --   CREATININE 0.73 0.66  --   --   CALCIUM 8.6* 8.4*  --   --     Lipid Panel:  Recent Labs  Lab 07/29/21 1048  CHOL 215*  TRIG 76  HDL 39*  CHOLHDL 5.5  VLDL 15  LDLCALC 161*    HgbA1c:  Recent Labs  Lab 07/29/21 1048  HGBA1C 6.1*    Urine Drug Screen: No results for input(s): LABOPIA, COCAINSCRNUR, LABBENZ, AMPHETMU, THCU, LABBARB in the last 168 hours.  Alcohol Level No results for input(s): ETH in the last 168 hours.  IMAGING past 24 hours CT HEAD WO CONTRAST (5MM)  Result Date: 07/30/2021 CLINICAL DATA:  Stroke, follow-up EXAM: CT HEAD WITHOUT CONTRAST TECHNIQUE: Contiguous axial images were obtained from the base of the skull through the vertex without intravenous contrast. RADIATION DOSE REDUCTION: This exam was performed according to the departmental  dose-optimization program which includes automated exposure control, adjustment of the mA and/or kV according to patient size and/or use of iterative reconstruction technique. COMPARISON:  07/30/2021 6:05 a.m. FINDINGS: Brain: Redemonstrated infarcts in the right cerebellum and left occipital lobe, not significantly changed from prior exam. Mild mass effect in the posterior fossa, with crowding around the fourth ventricle, but no hydrocephalus. No new areas of infarction. No acute hemorrhage or midline shift. No extra-axial collection. Vascular: No hyperdense vessel. Skull: Normal. Negative for fracture or focal lesion. Sinuses/Orbits: No acute finding. Other: The mastoids are well aerated. IMPRESSION: Unchanged appearance of right cerebellar and left occipital lobe infarcts, without evidence of hemorrhagic transformation or progression of infarcts. Electronically Signed   By: Merilyn Baba M.D.   On: 07/30/2021 19:50    PHYSICAL EXAM  Temp:  [97.5 F (36.4 C)-99.5 F (37.5 C)] 98.7 F (37.1 C) (01/17 0800) Pulse Rate:  [47-71] 56 (01/17 1100) Resp:  [11-21] 15 (01/17 1100) BP: (118-159)/(56-81) 159/65 (01/17 1100) SpO2:  [95 %-100 %] 100 % (01/17 1100) Weight:  [68.4 kg] 68.4 kg (01/17 0500)  General - Well  nourished, well developed pleasant middle-aged African-American lady, in no apparent distress.  Ophthalmologic - fundi not visualized due to noncooperation.  Cardiovascular - Regular rhythm and rate.  Mental Status -  Level of arousal and orientation to time, place, and person were intact. Language including expression, naming, repetition, comprehension was assessed and found intact. Attention span and concentration were normal. Recent and remote memory were intact. Fund of Knowledge was assessed and was intact.  Cranial Nerves II - XII - II - Visual field intact OU. III, IV, VI - Extraocular movements intact.  Mild saccadic dysmetria on lateral gaze V - Facial sensation intact  bilaterally. VII - Facial movement intact bilaterally. VIII - Hearing & vestibular intact bilaterally. X - Palate elevates symmetrically. XI - Chin turning & shoulder shrug intact bilaterally. XII - Tongue protrusion intact.  Motor Strength - The patients strength was normal in all extremities and pronator drift was absent.  Bulk was normal and fasciculations were absent.   Motor Tone - Muscle tone was assessed at the neck and appendages and was normal.  Reflexes - The patients reflexes were symmetrical in all extremities and she had no pathological reflexes.  Sensory - Light touch, temperature/pinprick were assessed and were symmetrical.    Coordination - The patient had normal movements in the hands and feet with no ataxia or dysmetria.  Tremor was absent.  Gait and Station - deferred.  ASSESSMENT/PLAN Ms. ZAYNEB BAUCUM is a 61 y.o. female with history of HLD, prediabetes presenting with N/V and dizziness since Saturday.   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 Mild chronic white matter ischemic changes CTA head & neck High grade narrowing of non dominant R vertebral artery 70% at origin and even greater at V2 segment, 55% narrowing of L vertebral origin. MRI  Acute infarcts in R cerebellum and L occipital cortex 2D Echo Bubble Study EF 55-60% 1/15 Repeat CT indicated increased edema and heterogeneity in R cerebellar hemisphere in infarcting tissue w/ new scattered petechial hemorrhaging as well as increased R posterior fossa mass effect with effaced R cerebellomedullary cistern and partially effaced right CP angle cistern, 5 mm R to L midline shift. 1/16 6:00 AM Repeat head CT this morning did not show interval hemorrhage or visible infarct progression. No hydrocephalus. 1/16 6:00 PM Repeat head CT unchanged  from morning CT Plan for repeat CT tomorrow (1/18).  LDL 161 HgbA1c 6.1 VTE prophylaxis - SCD    Diet   Diet regular Room service appropriate? Yes with Assist; Fluid consistency: Thin   No antithrombotic prior to admission, now on ASA. Infarcts have been stable so will start baby ASA today. Therapy recommendations:  CIR Disposition:  pending Frequent neuro checks. If patient's condition acutely worsens, stat CT and NSGY consult for possible subocciptal crani On hypertonic saline to reduce edema in brain, titrating down now.  Hyperlipidemia Home meds:  none LDL 161, goal < 70 Crestor 20 mg added this admission  Continue statin at discharge  Other Stroke Risk Factors Cigarette smoker advised to stop smoking   Hospital day # 2  France Ravens, MD PGY1 Resident I have personally obtained history,examined this patient, reviewed notes, independently viewed imaging studies, participated in medical decision making and plan of care.ROS completed by me personally and pertinent positives fully documented  I have made any additions or clarifications directly to the  above note. Agree with note above.  Patient neurological exam is stable and she remains on hypertonic saline with serum sodium is barely at goal at 149 this morning.  Recommend start tapering hypertonic saline drip over the next 24 hours and repeat CT scan of the head tomorrow morning.  If remains stable both clinically and neuroimaging wise will consider transfer out of ICU.  Continue aspirin alone due to mild hemorrhagic transformation.  Mobilize out of bed.  Therapy consults.  She may consider possible participation in the [place] trial in the future for symptomatic intracranial stenosis if interested.This patient is critically ill and at significant risk of neurological worsening, death and care requires constant monitoring of vital signs, hemodynamics,respiratory and cardiac monitoring, extensive review of multiple databases, frequent  neurological assessment, discussion with family, other specialists and medical decision making of high complexity.I have made any additions or clarifications directly to the above note.This critical care time does not reflect procedure time, or teaching time or supervisory time of PA/NP/Med Resident etc but could involve care discussion time.  I spent 30 minutes of neurocritical care time  in the care of  this patient.       Antony Contras, MD Medical Director [facility] Pager: [phone] 07/31/2021 1:36 PM  To contact Stroke Continuity provider, please refer to [email or web address] After hours, contact General Neurology

## 2021-07-31 NOTE — Progress Notes (Signed)
Inpatient Rehabilitation Admissions Coordinator   I will place order for rehab consult per protocol as patient has progressed with therapy.  Danne Baxter, RN, MSN Rehab Admissions Coordinator [phone] 07/31/2021 3:00 PM

## 2021-07-31 NOTE — Progress Notes (Addendum)
NAME:  [name], MRN:  [id number], DOB:  1960/10/02, LOS: 2 ADMISSION DATE:  07/28/2021, CONSULTATION DATE: July 30, 2021 REFERRING MD: Teaching service, CHIEF COMPLAINT: Dizziness  History of Present Illness:  Patient is 61 year old old African-American lady who presents with unsteady gait dizziness and vomiting she was admitted with the above in the ED she had a CAT scan without acute abnormality MRI was done and showed right cerebellar stroke.  Patient started having more weakness and dizziness follow-up CT scan showed patient has more swelling and midline shift that was communicated to the primary team and eventually to neuro conditions was made to start the patient on hypertonic saline to get the sodium up to 150-155.  Pertinent  Medical History  Tobacco abuse, HLD, prediabetic   Significant Hospital Events: Including procedures, antibiotic start and stop dates in addition to other pertinent events   1/15 Admitted with acute CVA 1/16 PCCM consulted, worsening cerebral edema, HTS started  Interim History / Subjective:  Brief interruption in HTS given IV infiltration in R arm No complaints except for pain in R arm 2/2 infiltration  CTH 1/16 evening stable  Objective   Blood pressure (!) 146/66, pulse (!) 48, temperature 98.7 F (37.1 C), temperature source Oral, resp. rate 13, height 5' 3.5" (1.613 m), weight 68.4 kg, last menstrual period 07/15/2009, SpO2 95 %.        Intake/Output Summary (Last 24 hours) at 07/31/2021 0934 Last data filed at 07/31/2021 0900 Gross per 24 hour  Intake 2238.23 ml  Output 300 ml  Net 1938.23 ml   Filed Weights   07/28/21 1928 07/30/21 0428 07/31/21 0500  Weight: 68.9 kg 67.6 kg 68.4 kg   Examination: General:  Pleasant adult female sitting upright in bed in NAD HEENT: MM pink/moist, pupils 3/reactive Neuro:  seems more alert and quicker on responses compared to yesterday, oriented x 3, MAE, no focal deficits  CV: SB, rr  PULM:  non  labored, CTA, room air GI: soft, bs+, ND/ NT Extremities: warm/dry, no LE edema  Skin: no rashes   Na 149  Assessment & Plan:   Acute right cerebellar and left occipital stroke  Cerebral edema  - per Stroke team  - ongoing HTS, goal 150-155, Na checks q 6hrs - remains normotensive  - TTE bubble 1/16 > LVEF 55-60%, no wall motion abnormalities, normal RV, no valve abnormalities, no evidence of shunt/ negative agitated bubble study  - further imaging per neurology - serial neuro exams  - if worsening mental status-> stat CTH and consider NSGY consult - ongoing PT/ OT - no AC given petechial hemorrhage on CT   HLD - statin per neuro    Tobacco abuse - cessation counseling    Prediabetes  - A1c 6.1, glucose stable on BMET  Labs   CBC: Recent Labs  Lab 07/28/21 2212 07/30/21 0626 07/31/21 0652  WBC 9.0 8.0 6.9  NEUTROABS 7.2  --   --   HGB 14.0 13.5 11.8*  HCT 42.5 40.9 36.3  MCV 89.9 91.5 92.8  PLT 206 207 623    Basic Metabolic Panel: Recent Labs  Lab 07/30/21 0626 07/30/21 0956 07/30/21 1432 07/30/21 1857 07/30/21 2133 07/31/21 0033 07/31/21 0652  NA 144 143 146* 144   143 144 147* 149*  K 3.9 4.0 3.9 3.7 3.9  --   --   CL 109 112* 114* 114* 115*  --   --   CO2 26 25 24 22 24   --   --  GLUCOSE 95 99 91 158* 119*  --   --   BUN 10 9 8 6 6   --   --   CREATININE 0.82 0.73 0.77 0.73 0.66  --   --   CALCIUM 8.9 8.6* 8.6* 8.6* 8.4*  --   --    GFR: Estimated Creatinine Clearance: 72 mL/min (by C-G formula based on SCr of 0.66 mg/dL). Recent Labs  Lab 07/28/21 2212 07/30/21 0626 07/31/21 0652  WBC 9.0 8.0 6.9    Liver Function Tests: Recent Labs  Lab 07/28/21 2322  AST 16  ALT 11  ALKPHOS 63  BILITOT 0.5  PROT 7.0  ALBUMIN 3.7   Recent Labs  Lab 07/28/21 2322  LIPASE 10*   No results for input(s): AMMONIA in the last 168 hours.  ABG No results found for: PHART, PCO2ART, PO2ART, HCO3, TCO2, ACIDBASEDEF, O2SAT   Coagulation  Profile: Recent Labs  Lab 07/30/21 0626  INR 1.0    Cardiac Enzymes: No results for input(s): CKTOTAL, CKMB, CKMBINDEX, TROPONINI in the last 168 hours.  HbA1C: Hgb A1c MFr Bld  Date/Time Value Ref Range Status  07/29/2021 10:48 AM 6.1 (H) 4.8 - 5.6 % Final    Comment:    (NOTE) Pre diabetes:          5.7%-6.4%  Diabetes:              >6.4%  Glycemic control for   <7.0% adults with diabetes     CBG: Recent Labs  Lab 07/30/21 [place], ACNP [facility] 07/31/2021, 9:35 AM  See Amion for pager If no response to pager, please call PCCM consult pager After 7:00 pm call Elink

## 2021-07-31 NOTE — Progress Notes (Signed)
Verbal to taper hypertonic saline for the next 24 hours. 3% cut from 75 to 35. Documented in [facility] Jakylan Ron, Rande Brunt, RN

## 2021-07-31 NOTE — Progress Notes (Addendum)
Started USG PIV to left upper arm for continuous infusion of  3% NaCl, IV watch in place.Secured [employer] production planner recommending central line for hypertonic infusion.RN will address this am.

## 2021-08-01 ENCOUNTER — Inpatient Hospital Stay (HOSPITAL_COMMUNITY): Payer: 59

## 2021-08-01 LAB — SODIUM: Sodium: 146 mmol/L — ABNORMAL HIGH (ref 135–145)

## 2021-08-01 NOTE — Progress Notes (Addendum)
STROKE TEAM PROGRESS NOTE   INTERVAL HISTORY Patient's RN at bedside. Patient sitting on chair. Patient continues to deny any emergent neurological symptoms. Patient states her nausea has improved. Patient reports her dizziness has largely improved and strength is improving.   Head CT this morning showed Stable right cerebellar and left PCA territory infarcts. Stable mild mass effect on the 4th ventricle. No hemorrhagic transformation or other complicating features. Plan to discontinue hypertonic saline and transfer to neurology floor bed. Vitals:   08/01/21 0500 08/01/21 0600 08/01/21 0700 08/01/21 0800  BP: (!) 142/66 (!) 136/59 (!) 143/68   Pulse: (!) 56 (!) 55 (!) 59   Resp: 10 16 13    Temp:    98.2 F (36.8 C)  TempSrc:    Oral  SpO2:  98% 95%   Weight:      Height:       CBC:  Recent Labs  Lab 07/28/21 2212 07/30/21 0626 07/31/21 0652  WBC 9.0 8.0 6.9  NEUTROABS 7.2  --   --   HGB 14.0 13.5 11.8*  HCT 42.5 40.9 36.3  MCV 89.9 91.5 92.8  PLT 206 207 983    Basic Metabolic Panel:  Recent Labs  Lab 07/30/21 1857 07/30/21 2133 07/31/21 0033 07/31/21 1920 08/01/21 0334  NA 144   143 144   < > 144 146*  K 3.7 3.9  --   --   --   CL 114* 115*  --   --   --   CO2 22 24  --   --   --   GLUCOSE 158* 119*  --   --   --   BUN 6 6  --   --   --   CREATININE 0.73 0.66  --   --   --   CALCIUM 8.6* 8.4*  --   --   --    < > = values in this interval not displayed.    Lipid Panel:  Recent Labs  Lab 07/29/21 1048  CHOL 215*  TRIG 76  HDL 39*  CHOLHDL 5.5  VLDL 15  LDLCALC 161*    HgbA1c:  Recent Labs  Lab 07/29/21 1048  HGBA1C 6.1*    Urine Drug Screen: No results for input(s): LABOPIA, COCAINSCRNUR, LABBENZ, AMPHETMU, THCU, LABBARB in the last 168 hours.  Alcohol Level No results for input(s): ETH in the last 168 hours.  IMAGING past 24 hours No results found.  PHYSICAL EXAM  Temp:  [97.9 F (36.6 C)-98.7 F (37.1 C)] 98.2 F (36.8 C) (01/18  0800) Pulse Rate:  [46-64] 59 (01/18 0700) Resp:  [10-23] 13 (01/18 0700) BP: (118-159)/(57-79) 143/68 (01/18 0700) SpO2:  [95 %-100 %] 95 % (01/18 0700)  General - Well nourished, well developed pleasant middle-aged African-American lady, in no apparent distress.  Ophthalmologic - fundi not visualized due to noncooperation.  Cardiovascular - Regular rhythm and rate.  Mental Status -  Level of arousal and orientation to time, place, and person were intact. Language including expression, naming, repetition, comprehension was assessed and found intact. Attention span and concentration were normal. Recent and remote memory were intact. Fund of Knowledge was assessed and was intact.  Cranial Nerves II - XII - II - Visual field intact OU. III, IV, VI - Extraocular movements intact.  Mild saccadic dysmetria on lateral gaze V - Facial sensation intact bilaterally. VII - Facial movement intact bilaterally. VIII - Hearing & vestibular intact bilaterally. X - Palate elevates symmetrically. XI Geryl Councilman  turning & shoulder shrug intact bilaterally. XII - Tongue protrusion intact.  Motor Strength - The patients strength was normal in all extremities and pronator drift was absent.  Bulk was normal and fasciculations were absent.   Motor Tone - Muscle tone was assessed at the neck and appendages and was normal.  Reflexes - The patients reflexes were symmetrical in all extremities and she had no pathological reflexes.  Sensory - Light touch, temperature/pinprick were assessed and were symmetrical.    Coordination - The patient had normal movements in the hands and feet with no ataxia or dysmetria.  Tremor was absent.  Gait and Station - deferred.  ASSESSMENT/PLAN Ms. TENESSA MARSEE is a 61 y.o. female with history of HLD, prediabetes presenting with N/V and dizziness since Saturday.   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 Mild chronic white matter ischemic changes CTA head & neck High grade narrowing of non dominant R vertebral artery 70% at origin and even greater at V2 segment, 55% narrowing of L vertebral origin. MRI  Acute infarcts in R cerebellum and L occipital cortex 2D Echo Bubble Study EF 55-60% 1/15 Repeat CT indicated increased edema and heterogeneity in R cerebellar hemisphere in infarcting tissue w/ new scattered petechial hemorrhaging as well as increased R posterior fossa mass effect with effaced R cerebellomedullary cistern and partially effaced right CP angle cistern, 5 mm R to L midline shift. 1/16 6:00 AM Repeat head CT this morning did not show interval hemorrhage or visible infarct progression. No hydrocephalus. 1/16 6:00 PM Repeat head CT unchanged from morning CT 1/18 Repeat CT Stable right cerebellar and left PCA territory infarcts. Stable mild mass effect on the 4th ventricle. No hemorrhagic transformation or other complicating features Plan for repeat CT tomorrow (1/18).  LDL 161 HgbA1c 6.1 VTE prophylaxis - SCD    Diet   Diet regular Room service appropriate? Yes with Assist; Fluid consistency: Thin   No antithrombotic prior to admission, now on ASA. Infarcts have been stable so will start baby ASA today. Therapy recommendations:  CIR Disposition:  pending Frequent neuro checks. If patient's condition acutely worsens, stat CT and NSGY consult for possible subocciptal crani On hypertonic saline to reduce edema in brain, titrating down now.  Hyperlipidemia Home meds:  none LDL 161, goal < 70 Crestor 20 mg added this admission  Continue statin at discharge  Other Stroke Risk Factors Cigarette smoker advised to stop smoking   Hospital day # 3  France Ravens, MD PGY1 Resident I have personally obtained history,examined this patient, reviewed notes, independently viewed  imaging studies, participated in medical decision making and plan of care.ROS completed by me personally and pertinent positives fully documented  I have made any additions or clarifications directly to the above note. Agree with note above.  Patient is doing well and neurologically stable.  Plan discontinue hypertonic saline.  Mobilize out of bed.  Transfer to neurology floor bed.  Hopefully transfer to inpatient rehab in the next few days after insurance approval and when bed available.  Greater than 50% time during this 50-minute visit was spent on counseling and coordination of care about her cerebellar stroke and discussion about cytotoxic edema and stroke evaluation and treatment answering questions.  Antony Contras, MD Medical Director [facility] Pager: [phone] 08/01/2021 3:28 PM  To contact Stroke Continuity provider, please  refer to [email or web address] After hours, contact General Neurology

## 2021-08-01 NOTE — Progress Notes (Signed)
Physical Therapy Treatment Patient Details Name: [name] MRN: [id number] DOB: Nov 16, 1960 Today's Date: 08/01/2021   History of Present Illness 61 yo female presenting to ED on 1/14 with nausea and vomiting when standing up. 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 1/16 CTH shows worsening cerebellar edema with potential petechial hemorrhage; worsening mass effect in right posterior fossa and new right-to-left midline shift. PMH including hyperlipidemia.    PT Comments    Pt with improving activity tolerance. Pt still struggles with gaze stabilization during mobility occasionally requiring steadying assist, especially during directional changes. At baseline, pt is independent and works full time, will need AIR to maximize functional recovery.    Recommendations for follow up therapy are one component of a multi-disciplinary discharge planning process, led by the attending physician.  Recommendations may be updated based on patient status, additional functional criteria and insurance authorization.  Follow Up Recommendations  Acute inpatient rehab (3hours/day)     Assistance Recommended at Discharge Set up Supervision/Assistance  Patient can return home with the following A little help with walking and/or transfers;A little help with bathing/dressing/bathroom;Assistance with cooking/housework;Direct supervision/assist for medications management;Direct supervision/assist for financial management;Assist for transportation;Help with stairs or ramp for entrance   Equipment Recommendations  Other (comment) (tbd)    Recommendations for Other Services       Precautions / Restrictions Precautions Precautions: Fall Restrictions Weight Bearing Restrictions: No     Mobility  Bed Mobility Overal bed mobility: Needs Assistance Bed Mobility: Supine to Sit     Supine to sit: Min guard, HOB elevated      General bed mobility comments: increased time and effort, use of bedrails    Transfers Overall transfer level: Needs assistance Equipment used: Rolling walker (2 wheels) Transfers: Sit to/from Stand Sit to Stand: Min guard           General transfer comment: increased time to rise, visual stabilization and increased time to get balanced    Ambulation/Gait Ambulation/Gait assistance: Min assist Gait Distance (Feet): 125 Feet Assistive device: Rolling walker (2 wheels) Gait Pattern/deviations: Step-through pattern, Decreased stride length, Narrow base of support Gait velocity: decr     General Gait Details: assist to steady during 90 degree directional changes, cues for gaze stabilization during gait. Truncal tremor with further gait distance, suspect secondary to fatigue   Stairs             Wheelchair Mobility    Modified Rankin (Stroke Patients Only) Modified Rankin (Stroke Patients Only) Pre-Morbid Rankin Score: No symptoms Modified Rankin: Moderately severe disability     Balance Overall balance assessment: Needs assistance Sitting-balance support: No upper extremity supported, Feet supported Sitting balance-Leahy Scale: Fair Sitting balance - Comments: sitting at edge of recliner to assess strength with no UE support or LOB   Standing balance support: Single extremity supported, During functional activity Standing balance-Leahy Scale: Poor                              Cognition Arousal/Alertness: Awake/alert Behavior During Therapy: Flat affect, WFL for tasks assessed/performed Overall Cognitive Status: Within Functional Limits for tasks assessed                                          Exercises Other Exercises Other Exercises:  cues throughout session for gaze stabilization on stationary object to lessen dizziness and nausea    General Comments General comments (skin integrity, edema, etc.): VSS on RA, assisted pt  with ordering her breakfast during session      Pertinent Vitals/Pain Pain Assessment Pain Assessment: Faces Faces Pain Scale: No hurt Pain Intervention(s): Monitored during session    Home Living                          Prior Function            PT Goals (current goals can now be found in the care plan section) Acute Rehab PT Goals PT Goal Formulation: With patient Time For Goal Achievement: 08/13/21 Potential to Achieve Goals: Good Progress towards PT goals: Progressing toward goals    Frequency    Min 4X/week      PT Plan Current plan remains appropriate    Co-evaluation              AM-PAC PT "6 Clicks" Mobility   Outcome Measure  Help needed turning from your back to your side while in a flat bed without using bedrails?: A Little Help needed moving from lying on your back to sitting on the side of a flat bed without using bedrails?: A Little Help needed moving to and from a bed to a chair (including a wheelchair)?: A Little Help needed standing up from a chair using your arms (e.g., wheelchair or bedside chair)?: A Little Help needed to walk in hospital room?: A Little Help needed climbing 3-5 steps with a railing? : A Lot 6 Click Score: 17    End of Session   Activity Tolerance: Patient tolerated treatment well;Patient limited by fatigue Patient left: with call bell/phone within reach;in chair;with chair alarm set Nurse Communication: Mobility status PT Visit Diagnosis: Other abnormalities of gait and mobility (R26.89);Ataxic gait (R26.0)     Time: 3893-7342 PT Time Calculation (min) (ACUTE ONLY): 18 min  Charges:  $Gait Training: 8-22 mins                    Stacie Glaze, PT DPT Acute Rehabilitation Services Pager [phone]  Office [phone]    [place] 08/01/2021, 1:51 PM

## 2021-08-01 NOTE — Progress Notes (Signed)
Inpatient Rehab Admissions Coordinator:   Met with patient at bedside to discuss CIR goals/expectations.  I reviewed with her that she would complete 3 hrs/day of therapy with ELOS of 10-14 days, dependent on progress. She states that her mother is home with her during the day and that she is independent (does not require a caregiver).  No other family listed in chart so I will call mom to speak to her as well.  I reviewed need for insurance auth, and I will start that request today.    Shann Medal, PT, DPT Admissions Coordinator [phone] 08/01/21  2:20 PM

## 2021-08-01 NOTE — Progress Notes (Addendum)
Occupational Therapy Treatment Patient Details Name: [name] MRN: [id number] DOB: 11-05-1960 Today's Date: 08/01/2021   History of present illness 61 yo female presenting to ED on 1/14 with nausea and vomiting when standing up. 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 1/16 CTH shows worsening cerebellar edema with potential petechial hemorrhage; worsening mass effect in right posterior fossa and new right-to-left midline shift. PMH including hyperlipidemia.   OT comments  Pt making steady progress towards OT goals this session. Session focus on standing balance, increasing activity tolerance, functional mobility and BADL reeducation. Pt continues to present with generalized deconditioning, impaired balance and intermittent dizziness with mobility ( however greatly improved from previous therapy session). Pt currently requires min guard assist for household distance functional mobility with rw and min guard for standing ADLs at sink. Pt using BUEs effectively during session, some increased time needed to open items but overall WFL. Pt would continue to benefit from skilled occupational therapy while admitted and after d/c to address the below listed limitations in order to improve overall functional mobility and facilitate independence with BADL participation. DC plan remains appropriate, will follow acutely per POC.       Recommendations for follow up therapy are one component of a multi-disciplinary discharge planning process, led by the attending physician.  Recommendations may be updated based on patient status, additional functional criteria and insurance authorization.    Follow Up Recommendations  Acute inpatient rehab (3hours/day)    Assistance Recommended at Discharge Frequent or constant Supervision/Assistance  Patient can return home with the following  A lot of help with walking and/or  transfers;A lot of help with bathing/dressing/bathroom   Equipment Recommendations  BSC/3in1;Other (comment) (RW; pending progress)    Recommendations for Other Services      Precautions / Restrictions Precautions Precautions: Fall Restrictions Weight Bearing Restrictions: No       Mobility Bed Mobility               General bed mobility comments: OOB in recliner    Transfers Overall transfer level: Needs assistance Equipment used: Rolling walker (2 wheels) Transfers: Sit to/from Stand Sit to Stand: Min guard           General transfer comment: min guard to rise from recliner for initial steadying assist     Balance Overall balance assessment: Needs assistance Sitting-balance support: No upper extremity supported, Feet supported Sitting balance-Leahy Scale: Fair Sitting balance - Comments: sitting at edge of recliner to assess strength with no UE support or LOB   Standing balance support: Single extremity supported, During functional activity Standing balance-Leahy Scale: Poor Standing balance comment: at least one UE supported during ADLS at sink                           ADL either performed or assessed with clinical judgement   ADL Overall ADL's : Needs assistance/impaired     Grooming: Wash/dry face;Oral care;Standing;Min guard Grooming Details (indicate cue type and reason): min guard for safety d/t reports of intermittent dizziness                 Toilet Transfer: Min guard;Ambulation;Rolling walker (2 wheels) Toilet Transfer Details (indicate cue type and reason): simulated via functional mobility, MIN verbal cues for rw mgmt         Functional mobility during ADLs: Min guard;Rolling walker (2 wheels) General ADL Comments: pt continues  to present with dizziness, impaired balance and generalized deconditioning however dizziness greatly improved from previous therapy session    Extremity/Trunk Assessment Upper Extremity  Assessment Upper Extremity Assessment: Generalized weakness;Overall WFL for tasks assessed (overall WFL; 4/5 BUEs, using BUEs during ADLS with no difficulties, opening items for ADLs)   Lower Extremity Assessment Lower Extremity Assessment: Defer to PT evaluation   Cervical / Trunk Assessment Cervical / Trunk Assessment: Normal    Vision Patient Visual Report: No change from baseline     Perception Perception Perception: Within Functional Limits   Praxis Praxis Praxis: Intact    Cognition Arousal/Alertness: Awake/alert Behavior During Therapy: Flat affect, WFL for tasks assessed/performed (tearful/emotional at times) Overall Cognitive Status: Within Functional Limits for tasks assessed                                 General Comments: AxOx4, following all commands, initially very flat and emotional but conversated more as session progressed, pt asking appropriate questions about rehab and POC        Exercises Other Exercises Other Exercises: pt reports cramping in L foot, pt completed ankle pumps throughout session    Shoulder Instructions       General Comments VSS on RA, pt with initial Low HR upon arrival in 50s RR 14 but increased with functional activity. assisted pt with ordering her breakfast during session    Pertinent Vitals/ Pain       Pain Assessment Pain Assessment: Faces Faces Pain Scale: Hurts a little bit Pain Location: head Pain Descriptors / Indicators: Headache Pain Intervention(s): Monitored during session  Home Living                                          Prior Functioning/Environment              Frequency  Min 2X/week        Progress Toward Goals  OT Goals(current goals can now be found in the care plan section)  Progress towards OT goals: Progressing toward goals  Acute Rehab OT Goals Patient Stated Goal: to go to rehab OT Goal Formulation: With patient Time For Goal Achievement:  08/13/21 Potential to Achieve Goals: Good  Plan Discharge plan remains appropriate;Frequency remains appropriate    Co-evaluation                 AM-PAC OT "6 Clicks" Daily Activity     Outcome Measure   Help from another person eating meals?: None Help from another person taking care of personal grooming?: A Little Help from another person toileting, which includes using toliet, bedpan, or urinal?: A Little Help from another person bathing (including washing, rinsing, drying)?: A Little Help from another person to put on and taking off regular upper body clothing?: A Little Help from another person to put on and taking off regular lower body clothing?: A Little 6 Click Score: 19    End of Session Equipment Utilized During Treatment: Gait belt;Rolling walker (2 wheels)  OT Visit Diagnosis: Muscle weakness (generalized) (M62.81);Dizziness and giddiness (R42)   Activity Tolerance Patient tolerated treatment well   Patient Left in chair;with call bell/phone within reach;with chair alarm set   Nurse Communication Mobility status;Other (comment) (Rn entered during session)        Time: [phone] OT Time Calculation (min): 28  min  Charges: OT General Charges $OT Visit: 1 Visit OT Treatments $Self Care/Home Management : 23-37 mins  Harley Alto., COTA/L Acute Rehabilitation Services [phone]   Precious Haws 08/01/2021, 10:08 AM

## 2021-08-02 LAB — GLUCOSE, CAPILLARY: Glucose-Capillary: 81 mg/dL (ref 70–99)

## 2021-08-02 MED ORDER — SENNOSIDES-DOCUSATE SODIUM 8.6-50 MG PO TABS
1.0000 | ORAL_TABLET | Freq: Two times a day (BID) | ORAL | Status: DC
  Administered 2021-08-02 – 2021-08-03 (×3): 1 via ORAL
  Filled 2021-08-02 (×3): qty 1

## 2021-08-02 NOTE — Progress Notes (Signed)
Physical Therapy Treatment Patient Details Name: [name] MRN: [id number] DOB: 25-Oct-1960 Today's Date: 08/02/2021   History of Present Illness 61 yo female presenting to ED on 1/14 with nausea and vomiting when standing up. 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 1/16 CTH shows worsening cerebellar edema with potential petechial hemorrhage; worsening mass effect in right posterior fossa and new right-to-left midline shift. PMH including hyperlipidemia.    PT Comments    Pt up with RN in bathroom upon arrival to room, reports her dizziness and nausea during mobility is improving. PT challenged pt's balance and gait by removing RW, pt tolerated ~30 ft and required steadying assist and HHA which is far from pt's baseline. PT also challenged pt's gait and dynamic balance with head turns, backward walking, high stepping, all of which required steadying assist and increased time. Pt remains appropriate for AIR.     Recommendations for follow up therapy are one component of a multi-disciplinary discharge planning process, led by the attending physician.  Recommendations may be updated based on patient status, additional functional criteria and insurance authorization.  Follow Up Recommendations  Acute inpatient rehab (3hours/day)     Assistance Recommended at Discharge Set up Supervision/Assistance  Patient can return home with the following A little help with walking and/or transfers;A little help with bathing/dressing/bathroom;Assistance with cooking/housework;Direct supervision/assist for medications management;Direct supervision/assist for financial management;Assist for transportation;Help with stairs or ramp for entrance   Equipment Recommendations       Recommendations for Other Services       Precautions / Restrictions Precautions Precautions: Fall Restrictions Weight Bearing Restrictions: No      Mobility  Bed Mobility Overal bed mobility: Needs Assistance             General bed mobility comments: up with RN    Transfers Overall transfer level: Needs assistance Equipment used: None Transfers: Sit to/from Stand Sit to Stand: Min assist           General transfer comment: light steadying assist when rising without UE support    Ambulation/Gait Ambulation/Gait assistance: Min guard, Min assist Gait Distance (Feet): 150 Feet (+75 - seated rest break needed) Assistive device: Rolling walker (2 wheels), 1 person hand held assist Gait Pattern/deviations: Step-through pattern, Decreased stride length, Narrow base of support Gait velocity: decr     General Gait Details: close guard when ambulatory with RW, min assist to steady with HHA only and pt very apprehensive, tolerated 35 ft ambulation without AD. Cues for widening BOS, upright posture, looking forward as opposed to down   Stairs             Wheelchair Mobility    Modified Rankin (Stroke Patients Only) Modified Rankin (Stroke Patients Only) Pre-Morbid Rankin Score: No symptoms Modified Rankin: Moderately severe disability     Balance Overall balance assessment: Needs assistance Sitting-balance support: No upper extremity supported, Feet supported Sitting balance-Leahy Scale: Fair     Standing balance support: Single extremity supported, During functional activity Standing balance-Leahy Scale: Poor                              Cognition Arousal/Alertness: Awake/alert Behavior During Therapy: Flat affect, WFL for tasks assessed/performed Overall Cognitive Status: Within Functional Limits for tasks assessed  Exercises Other Exercises Other Exercises: sit<>stand x5 without UE support, sit<>stand with feet semi-tandem requiring light assist and single UE support x3 bilat    General Comments        Pertinent  Vitals/Pain Pain Assessment Pain Assessment: Faces Faces Pain Scale: No hurt Pain Intervention(s): Monitored during session, Limited activity within patient's tolerance    Home Living                          Prior Function            PT Goals (current goals can now be found in the care plan section) Acute Rehab PT Goals PT Goal Formulation: With patient Time For Goal Achievement: 08/13/21 Potential to Achieve Goals: Good Progress towards PT goals: Progressing toward goals    Frequency    Min 4X/week      PT Plan Current plan remains appropriate    Co-evaluation              AM-PAC PT "6 Clicks" Mobility   Outcome Measure  Help needed turning from your back to your side while in a flat bed without using bedrails?: A Little Help needed moving from lying on your back to sitting on the side of a flat bed without using bedrails?: A Little Help needed moving to and from a bed to a chair (including a wheelchair)?: A Little Help needed standing up from a chair using your arms (e.g., wheelchair or bedside chair)?: A Little Help needed to walk in hospital room?: A Little Help needed climbing 3-5 steps with a railing? : A Lot 6 Click Score: 17    End of Session   Activity Tolerance: Patient tolerated treatment well Patient left: with call bell/phone within reach;in chair;with chair alarm set Nurse Communication: Mobility status PT Visit Diagnosis: Other abnormalities of gait and mobility (R26.89);Ataxic gait (R26.0)     Time: 1207-1226 PT Time Calculation (min) (ACUTE ONLY): 19 min  Charges:  $Gait Training: 8-22 mins                     Stacie Glaze, PT DPT Acute Rehabilitation Services Pager [phone]  Office [phone]    [place] 08/02/2021, 1:54 PM

## 2021-08-02 NOTE — PMR Pre-admission (Signed)
PMR Admission Coordinator Pre-Admission Assessment  Patient: [name] is an 61 y.o., female MRN: [id number] DOB: 1961/05/25 Height: '5\' 3"'  (160 cm) Weight: 69.3 kg  Insurance Information HMO:     PPO:      PCP:      IPA:      80/20:      OTHER:  PRIMARY: UHC      Policy#: [id number]      Subscriber: pt CM Name: Jackelyn Poling      Phone#: [phone]     Fax#: [phone] Pre-Cert#: [id number] auth for CIR from [place] with [facility] with updates due to Levora Angel at fax listed above. Concurrent review date TBD.      Employer:  Benefits:  Phone #: [phone]     Name:  Eff. Date: 07-15-21     Deduct: $1500 ($16.24 met)      Out of Pocket Max: $3500 (met $16.24 met)      Life Max: n/a CIR: 80%      SNF: 80% Outpatient: 80%     Co-Ins: 20% Home Health: 80%      Co-Ins: 20% DME: 80%     Co-Ins: 20% Providers:  SECONDARY:       Policy#:      Phone#:   Development worker, community:       Phone#:   The Actuary for patients in Inpatient Rehabilitation Facilities with attached Privacy Act [place] Records was provided and verbally reviewed with: N/A  Emergency Contact Information Contact Information     Name Relation Home Work Mobile   MckenziePeggy Mother [phone]         Current Medical History  Patient Admitting Diagnosis: CVA  History of Present Illness: [name]. Halpin is a 61 year old right-handed female with history of hyperlipidemia as well as prediabetes, tobacco use.   Presented 07/28/2021 with dizziness, unsteady gait and bouts of vomiting.  She had initially been seen at the urgent care 07/28/2021 for headache nausea vomiting and low-grade fever without relief with BC powder.  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  She was initially placed on hypertonic saline.  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  55%  narrowing at the left vertebral origin.  Patient did not receive tPA.  Echocardiogram with ejection fraction of 55 to 60% no wall motion abnormalities.  Admission chemistries unremarkable except glucose 125, troponin negative, hemoglobin A1c 6.1, urinalysis negative nitrite.  Neurology follow-up currently maintained on low-dose aspirin for CVA prophylaxis.  Latest follow-up cranial CT scan 08/01/2021 stable no hemorrhagic transformation no new intracranial abnormality.  Tolerating a regular diet.  Therapy evaluations completed due to patient decreased functional mobility was recommended  for a comprehensive rehab program.  Complete NIHSS TOTAL: 0  Patient's medical record from [facility] has been reviewed by the rehabilitation admission coordinator and physician.  Past Medical History  Past Medical History:  Diagnosis Date   Blood in stool    Hyperlipidemia    Vaginal fibroids     Has the patient had major surgery during 100 days prior to admission? No  Family History   family history includes Diabetes in her father; Heart disease in her father; Hyperlipidemia in her father; Hypertension in her father; Stroke in her father.  Current Medications  Current Facility-Administered Medications:    acetaminophen (TYLENOL) tablet 650 mg, 650 mg, Oral, Q6H PRN, 650  mg at 08/01/21 2008 **OR** acetaminophen (TYLENOL) suppository 650 mg, 650 mg, Rectal, Q6H PRN, Gaylan Gerold, DO   aspirin EC tablet 81 mg, 81 mg, Oral, Daily, Leonie Man, Pramod S, MD, 81 mg at 08/02/21 0912   Chlorhexidine Gluconate Cloth 2 % PADS 6 each, 6 each, Topical, Daily, Etheleen Nicks, MD, 6 each at 08/02/21 0912   mupirocin ointment (BACTROBAN) 2 % 1 application, 1 application, Nasal, BID, Amie Portland, MD, 1 application at 25/95/63 2115   polyethylene glycol (MIRALAX / GLYCOLAX) packet 17 g, 17 g, Oral, Daily PRN, Gaylan Gerold, DO, 17 g at 08/02/21 0953   rosuvastatin (CRESTOR) tablet 20 mg, 20 mg, Oral, Daily, Gaylan Gerold, DO,  20 mg at 08/02/21 0912   senna-docusate (Senokot-S) tablet 1 tablet, 1 tablet, Oral, BID, Janine Ores, NP, 1 tablet at 08/02/21 2115  Patients Current Diet:  Diet Order             Diet regular Room Schneider appropriate? Yes with Assist; Fluid consistency: Thin  Diet effective now                   Precautions / Restrictions Precautions Precautions: Fall Precaution Comments: Per RN, MD does not want pt mobilizing OOB today, eval EOB only. Restrictions Weight Bearing Restrictions: No   Has the patient had 2 or more falls or a fall with injury in the past year? Yes  Prior Activity Level Community (5-7x/wk): fully independent, working as a IT sales professional, driving, no DME used.  Prior Functional Level Self Care: Did the patient need help bathing, dressing, using the toilet or eating? Independent  Indoor Mobility: Did the patient need assistance with walking from room to room (with or without device)? Independent  Stairs: Did the patient need assistance with internal or external stairs (with or without device)? Independent  Functional Cognition: Did the patient need help planning regular tasks such as shopping or remembering to take medications? Independent  Patient Information Are you of Hispanic, Latino/a,or Spanish origin?: A. No, not of Hispanic, Latino/a, or Spanish origin What is your race?: B. Black or African American Do you need or want an interpreter to communicate with a doctor or health care staff?: 0. No  Patient's Response To:  Health Literacy and Transportation Is the patient able to respond to health literacy and transportation needs?: Yes Health Literacy - How often do you need to have someone help you when you read instructions, pamphlets, or other written material from your doctor or pharmacy?: Never In the past 12 months, has lack of transportation kept you from medical appointments or from getting medications?: Yes In the past 12  months, has lack of transportation kept you from meetings, work, or from getting things needed for daily living?: Yes  Home Assistive Devices / [place] Devices/Equipment: Eyeglasses Home Equipment: None  Prior Device Use: Indicate devices/aids used by the patient prior to current illness, exacerbation or injury? None of the above  Current Functional Level Cognition  Overall Cognitive Status: Within Functional Limits for tasks assessed Current Attention Level: Selective Orientation Level: Oriented X4 Following Commands: Follows one step commands with increased time Safety/Judgement: Decreased awareness of deficits General Comments: AxOx4, following all commands, initially very flat and emotional but conversated more as session progressed, pt asking appropriate questions about rehab and POC    Extremity Assessment (includes Sensation/Coordination)  Upper Extremity Assessment: Generalized weakness, Overall WFL for tasks assessed (overall WFL; 4/5 BUEs, using BUEs during ADLS with no difficulties, opening  items for ADLs)  Lower Extremity Assessment: Defer to PT evaluation    ADLs  Overall ADL's : Needs assistance/impaired Eating/Feeding: Minimal assistance, Bed level Grooming: Wash/dry face, Oral care, Standing, Min guard Grooming Details (indicate cue type and reason): min guard for safety d/t reports of intermittent dizziness Upper Body Bathing: Moderate assistance, Sitting Lower Body Bathing: Maximal assistance, Bed level, Sitting/lateral leans Upper Body Dressing : Moderate assistance, Sitting Lower Body Dressing: Maximal assistance, Sitting/lateral leans, Bed level Toilet Transfer: Min guard, Ambulation, Rolling walker (2 wheels) Toilet Transfer Details (indicate cue type and reason): simulated via functional mobility, MIN verbal cues for rw mgmt Functional mobility during ADLs: Min guard, Rolling walker (2 wheels) General ADL Comments: pt continues to present with  dizziness, impaired balance and generalized deconditioning however dizziness greatly improved from previous therapy session    Mobility  Overal bed mobility: Needs Assistance Bed Mobility: Supine to Sit Supine to sit: Min guard, HOB elevated Sit to supine: Min assist, HOB elevated, +2 for safety/equipment General bed mobility comments: up with RN    Transfers  Overall transfer level: Needs assistance Equipment used: None Transfers: Sit to/from Stand Sit to Stand: Min assist General transfer comment: light steadying assist when rising without UE support    Ambulation / Gait / Stairs / Emergency planning/management officer  Ambulation/Gait Ambulation/Gait assistance: Min guard, Min assist Gait Distance (Feet): 150 Feet (+75 - seated rest break needed) Assistive device: Rolling walker (2 wheels), 1 person hand held assist Gait Pattern/deviations: Step-through pattern, Decreased stride length, Narrow base of support General Gait Details: close guard when ambulatory with RW, min assist to steady with HHA only and pt very apprehensive, tolerated 35 ft ambulation without AD. Cues for widening BOS, upright posture, looking forward as opposed to down Gait velocity: decr    Posture / Balance Dynamic Sitting Balance Sitting balance - Comments: sitting at edge of recliner to assess strength with no UE support or LOB Balance Overall balance assessment: Needs assistance Sitting-balance support: No upper extremity supported, Feet supported Sitting balance-Leahy Scale: Fair Sitting balance - Comments: sitting at edge of recliner to assess strength with no UE support or LOB Standing balance support: Single extremity supported, During functional activity Standing balance-Leahy Scale: Poor Standing balance comment: at least one UE supported during ADLS at sink    Special needs/care consideration N/a   Previous Home Environment (from acute therapy documentation) Living Arrangements: Parent (Mother) Available Help  at Discharge: Family, Available 24 hours/day Type of Home: Apartment Home Layout: One level Home Access: Level entry Bathroom Shower/Tub: Chiropodist: Standard Home Care Services: No 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  Discharge Living Setting Plans for Discharge Living Setting: Patient's home, Lives with (comment) (mother) Type of Home at Discharge: Apartment Discharge Home Layout: One level Discharge Home Access: Level entry Discharge Bathroom Shower/Tub: Tub/shower unit Discharge Bathroom Toilet: Standard Discharge Bathroom Accessibility: Yes How Accessible: Accessible via walker Does the patient have any problems obtaining your medications?: No  Social/Family/Support Systems Anticipated Caregiver: Penelope Fittro (mom) Anticipated Caregiver's Contact Information: [phone] Ability/Limitations of Caregiver: supervision ONLY Caregiver Availability: 24/7 Discharge Plan Discussed with Primary Caregiver: Yes Is Caregiver In Agreement with Plan?: Yes Does Caregiver/Family have Issues with Lodging/Transportation while Pt is in Rehab?: Yes (doesn't drive)  Goals Patient/Family Goal for Rehab: PT/OT mod I, SLP mod I Expected length of stay: 10-12 days Pt/Family Agrees to Admission and willing to participate: Yes Program Orientation Provided & Reviewed with Pt/Caregiver Including Roles  & Responsibilities:  Yes  Barriers to Discharge: Decreased caregiver support  Decrease burden of Care through IP rehab admission: n/a  Possible need for SNF placement upon discharge: n/a  Patient Condition: I have reviewed medical records from [facility], spoken with CM, and patient and family member. I met with patient at the bedside and discussed via phone for inpatient rehabilitation assessment.  Patient will benefit from ongoing PT, OT, and SLP, can actively participate in 3 hours of therapy a day 5 days of the week, and can make measurable gains during the  admission.  Patient will also benefit from the coordinated team approach during an Inpatient Acute Rehabilitation admission.  The patient will receive intensive therapy as well as Rehabilitation physician, nursing, social worker, and care management interventions.  Due to safety, skin/wound care, disease management, medication administration, pain management, and patient education the patient requires 24 hour a day rehabilitation nursing.  The patient is currently min assist with mobility and basic ADLs.  Discharge setting and therapy post discharge at home with home health is anticipated.  Patient has agreed to participate in the Acute Inpatient Rehabilitation Program and will admit today.  Preadmission Screen Completed By:  Michel Santee, PT, DPT 08/03/2021 9:36 AM ______________________________________________________________________   Discussed status with Dr. Naaman Plummer on 08/03/21  at 9:37 AM  and received approval for admission today.  Admission Coordinator:  Michel Santee, PT,DPT time 9:37 AM Sudie Grumbling 08/03/21    Assessment/Plan: Diagnosis: posterior circulation CVA Does the need for close, 24 hr/day Medical supervision in concert with the patient's rehab needs make it unreasonable for this patient to be served in a less intensive setting? Yes Co-Morbidities requiring supervision/potential complications: lipids, post-stroke sequelae Due to bladder management, bowel management, safety, skin/wound care, disease management, medication administration, pain management, and patient education, does the patient require 24 hr/day rehab nursing? Yes Does the patient require coordinated care of a physician, rehab nurse, PT, OT, and SLP to address physical and functional deficits in the context of the above medical diagnosis(es)? Yes Addressing deficits in the following areas: balance, endurance, locomotion, strength, transferring, bowel/bladder control, bathing, dressing, feeding, grooming, toileting,  cognition, and psychosocial support Can the patient actively participate in an intensive therapy program of at least 3 hrs of therapy 5 days a week? Yes The potential for patient to make measurable gains while on inpatient rehab is excellent Anticipated functional outcomes upon discharge from inpatient rehab: modified independent PT, modified independent OT, modified independent SLP Estimated rehab length of stay to reach the above functional goals is: 10-12 days Anticipated discharge destination: Home 10. Overall Rehab/Functional Prognosis: excellent   MD Signature: Meredith Staggers, MD, [place] Director Rehabilitation Services 08/03/2021

## 2021-08-02 NOTE — Progress Notes (Addendum)
STROKE TEAM PROGRESS NOTE   INTERVAL HISTORY Patient's RN at bedside. Patient sitting on chair. Patient continues to deny any emergent neurological symptoms. Patient states her nausea has improved. Patient reports her dizziness has largely improved and strength is improving.   Plan is still to transfer to neurology floor bed. CIR insurance authorization pending. Repeat Ct y`day stable right cerebellar and left PCA territory infarcts. Stable mild mass effect on the 4th ventricle. No hemorrhagic transformation or other complicating features  Vitals:   08/02/21 0800 08/02/21 0900 08/02/21 1000 08/02/21 1200  BP: 137/63 (!) 142/67 (!) 144/80   Pulse: (!) 53 (!) 57 62   Resp: (!) 9 16 13    Temp: 98.1 F (36.7 C)   98 F (36.7 C)  TempSrc: Oral   Oral  SpO2: 99% 98% 98%   Weight:      Height:       CBC:  Recent Labs  Lab 07/28/21 2212 07/30/21 0626 07/31/21 0652  WBC 9.0 8.0 6.9  NEUTROABS 7.2  --   --   HGB 14.0 13.5 11.8*  HCT 42.5 40.9 36.3  MCV 89.9 91.5 92.8  PLT 206 207 947    Basic Metabolic Panel:  Recent Labs  Lab 07/30/21 1857 07/30/21 2133 07/31/21 0033 07/31/21 1920 08/01/21 0334  NA 144   143 144   < > 144 146*  K 3.7 3.9  --   --   --   CL 114* 115*  --   --   --   CO2 22 24  --   --   --   GLUCOSE 158* 119*  --   --   --   BUN 6 6  --   --   --   CREATININE 0.73 0.66  --   --   --   CALCIUM 8.6* 8.4*  --   --   --    < > = values in this interval not displayed.    Lipid Panel:  Recent Labs  Lab 07/29/21 1048  CHOL 215*  TRIG 76  HDL 39*  CHOLHDL 5.5  VLDL 15  LDLCALC 161*    HgbA1c:  Recent Labs  Lab 07/29/21 1048  HGBA1C 6.1*    Urine Drug Screen: No results for input(s): LABOPIA, COCAINSCRNUR, LABBENZ, AMPHETMU, THCU, LABBARB in the last 168 hours.  Alcohol Level No results for input(s): ETH in the last 168 hours.  IMAGING past 24 hours No results found.  PHYSICAL EXAM  Temp:  [98 F (36.7 C)-98.4 F (36.9 C)] 98 F (36.7  C) (01/19 1200) Pulse Rate:  [50-62] 62 (01/19 1000) Resp:  [9-20] 13 (01/19 1000) BP: (121-156)/(55-108) 144/80 (01/19 1000) SpO2:  [93 %-99 %] 98 % (01/19 1000)  General - Well nourished, well developed pleasant middle-aged African-American lady, in no apparent distress.  Ophthalmologic - fundi not visualized due to noncooperation.  Cardiovascular - Regular rhythm and rate.  Mental Status -  Level of arousal and orientation to time, place, and person were intact. Language including expression, naming, repetition, comprehension was assessed and found intact. Attention span and concentration were normal. Recent and remote memory were intact. Fund of Knowledge was assessed and was intact.  Cranial Nerves II - XII - II - Visual field intact OU. III, IV, VI - Extraocular movements intact.  Mild saccadic dysmetria on lateral gaze V - Facial sensation intact bilaterally. VII - Facial movement intact bilaterally. VIII - Hearing & vestibular intact bilaterally. X - Palate elevates symmetrically.  XI - Chin turning & shoulder shrug intact bilaterally. XII - Tongue protrusion intact.  Motor Strength - The patients strength was normal in all extremities and pronator drift was absent.  Bulk was normal and fasciculations were absent.   Motor Tone - Muscle tone was assessed at the neck and appendages and was normal.  Reflexes - The patients reflexes were symmetrical in all extremities and she had no pathological reflexes.  Sensory - Light touch, temperature/pinprick were assessed and were symmetrical.    Coordination - The patient had normal movements in the hands and feet with no ataxia or dysmetria.  Tremor was absent.  Gait and Station - deferred.  ASSESSMENT/PLAN [name] is a 61 y.o. female with history of HLD, prediabetes presenting with N/V and dizziness since Saturday.   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 Mild chronic white matter ischemic changes CTA head & neck High grade narrowing of non dominant R vertebral artery 70% at origin and even greater at V2 segment, 55% narrowing of L vertebral origin. MRI  Acute infarcts in R cerebellum and L occipital cortex 2D Echo Bubble Study EF 55-60% 1/15 Repeat CT indicated increased edema and heterogeneity in R cerebellar hemisphere in infarcting tissue w/ new scattered petechial hemorrhaging as well as increased R posterior fossa mass effect with effaced R cerebellomedullary cistern and partially effaced right CP angle cistern, 5 mm R to L midline shift. 1/16 6:00 AM Repeat head CT this morning did not show interval hemorrhage or visible infarct progression. No hydrocephalus. 1/16 6:00 PM Repeat head CT unchanged from morning CT 1/18 Repeat CT Stable right cerebellar and left PCA territory infarcts. Stable mild mass effect on the 4th ventricle. No hemorrhagic transformation or other complicating features  LDL 161 HgbA1c 6.1 VTE prophylaxis - SCD    Diet   Diet regular Room service appropriate? Yes with Assist; Fluid consistency: Thin   No antithrombotic prior to admission, now on ASA. Infarcts have been stable so will start baby ASA today. Therapy recommendations:  CIR Disposition:  pending Hypertonic saline discontinued  Hyperlipidemia Home meds:  none LDL 161, goal < 70 Crestor 20 mg added this admission  Continue statin at discharge  Other Stroke Risk Factors Cigarette smoker advised to stop smoking   Hospital day # 4  France Ravens, MD PGY1 Resident I have personally obtained history,examined this patient, reviewed notes, independently viewed imaging studies, participated in medical decision making and plan of care.ROS completed by me personally and pertinent positives fully documented  I have made any additions or  clarifications directly to the above note. Agree with note above. Patient is doing well. Plan to transfer out of ICU to floor and hopefully to rehab in next few days.This patient is critically ill and at significant risk of neurological worsening, death and care requires constant monitoring of vital signs, hemodynamics,respiratory and cardiac monitoring, extensive review of multiple databases, frequent neurological assessment, discussion with family, other specialists and medical decision making of high complexity.I have made any additions or clarifications directly to the above note.This critical care time does not reflect procedure time, or teaching time or supervisory time of PA/NP/Med Resident etc but could involve care discussion time.  I spent 30 minutes of neurocritical care time  in the care of  this patient.      Antony Contras, MD Medical  Director [facility] Pager: [phone] 08/02/2021 4:19 PM  To contact Stroke Continuity provider, please refer to [email or web address] After hours, contact General Neurology

## 2021-08-03 ENCOUNTER — Encounter (HOSPITAL_COMMUNITY): Payer: Self-pay | Admitting: Internal Medicine

## 2021-08-03 ENCOUNTER — Other Ambulatory Visit: Payer: Self-pay

## 2021-08-03 ENCOUNTER — Inpatient Hospital Stay (HOSPITAL_COMMUNITY)
Admission: RE | Admit: 2021-08-03 | Discharge: 2021-08-15 | DRG: 057 | Disposition: A | Discharge status: Home / Self Care | Payer: 59 | Source: Intra-hospital | Attending: Physical Medicine and Rehabilitation | Admitting: Physical Medicine and Rehabilitation

## 2021-08-03 ENCOUNTER — Encounter (HOSPITAL_COMMUNITY): Payer: Self-pay | Admitting: Physical Medicine and Rehabilitation

## 2021-08-03 DIAGNOSIS — Z880 Allergy status to penicillin: Secondary | ICD-10-CM | POA: Diagnosis not present

## 2021-08-03 DIAGNOSIS — R3915 Urgency of urination: Secondary | ICD-10-CM | POA: Diagnosis present

## 2021-08-03 DIAGNOSIS — E119 Type 2 diabetes mellitus without complications: Secondary | ICD-10-CM

## 2021-08-03 DIAGNOSIS — R2689 Other abnormalities of gait and mobility: Secondary | ICD-10-CM | POA: Diagnosis present

## 2021-08-03 DIAGNOSIS — E663 Overweight: Secondary | ICD-10-CM | POA: Diagnosis present

## 2021-08-03 DIAGNOSIS — Z6827 Body mass index (BMI) 27.0-27.9, adult: Secondary | ICD-10-CM

## 2021-08-03 DIAGNOSIS — R350 Frequency of micturition: Secondary | ICD-10-CM | POA: Diagnosis present

## 2021-08-03 DIAGNOSIS — Z833 Family history of diabetes mellitus: Secondary | ICD-10-CM | POA: Diagnosis not present

## 2021-08-03 DIAGNOSIS — I69398 Other sequelae of cerebral infarction: Principal | ICD-10-CM

## 2021-08-03 DIAGNOSIS — Z87891 Personal history of nicotine dependence: Secondary | ICD-10-CM

## 2021-08-03 DIAGNOSIS — E559 Vitamin D deficiency, unspecified: Secondary | ICD-10-CM | POA: Diagnosis present

## 2021-08-03 DIAGNOSIS — K5900 Constipation, unspecified: Secondary | ICD-10-CM | POA: Diagnosis present

## 2021-08-03 DIAGNOSIS — I63532 Cerebral infarction due to unspecified occlusion or stenosis of left posterior cerebral artery: Secondary | ICD-10-CM | POA: Diagnosis not present

## 2021-08-03 DIAGNOSIS — Z823 Family history of stroke: Secondary | ICD-10-CM

## 2021-08-03 DIAGNOSIS — Z8249 Family history of ischemic heart disease and other diseases of the circulatory system: Secondary | ICD-10-CM

## 2021-08-03 DIAGNOSIS — I6931 Attention and concentration deficit following cerebral infarction: Secondary | ICD-10-CM | POA: Diagnosis not present

## 2021-08-03 DIAGNOSIS — R7303 Prediabetes: Secondary | ICD-10-CM | POA: Diagnosis present

## 2021-08-03 DIAGNOSIS — E785 Hyperlipidemia, unspecified: Secondary | ICD-10-CM | POA: Diagnosis present

## 2021-08-03 DIAGNOSIS — I639 Cerebral infarction, unspecified: Secondary | ICD-10-CM | POA: Diagnosis not present

## 2021-08-03 DIAGNOSIS — R42 Dizziness and giddiness: Secondary | ICD-10-CM | POA: Diagnosis present

## 2021-08-03 DIAGNOSIS — Z83438 Family history of other disorder of lipoprotein metabolism and other lipidemia: Secondary | ICD-10-CM | POA: Diagnosis not present

## 2021-08-03 DIAGNOSIS — R001 Bradycardia, unspecified: Secondary | ICD-10-CM | POA: Diagnosis not present

## 2021-08-03 DIAGNOSIS — Z23 Encounter for immunization: Secondary | ICD-10-CM

## 2021-08-03 DIAGNOSIS — I959 Hypotension, unspecified: Secondary | ICD-10-CM | POA: Diagnosis not present

## 2021-08-03 LAB — GLUCOSE, CAPILLARY: Glucose-Capillary: 93 mg/dL (ref 70–99)

## 2021-08-03 MED ORDER — SENNOSIDES-DOCUSATE SODIUM 8.6-50 MG PO TABS
1.0000 | ORAL_TABLET | Freq: Two times a day (BID) | ORAL | Status: DC
  Administered 2021-08-03 – 2021-08-08 (×10): 1 via ORAL
  Filled 2021-08-03 (×10): qty 1

## 2021-08-03 MED ORDER — ROSUVASTATIN CALCIUM 20 MG PO TABS
20.0000 mg | ORAL_TABLET | Freq: Every day | ORAL | Status: DC
  Administered 2021-08-04 – 2021-08-15 (×12): 20 mg via ORAL
  Filled 2021-08-03 (×12): qty 1

## 2021-08-03 MED ORDER — ACETAMINOPHEN 650 MG RE SUPP: 650.0000 mg | Freq: Four times a day (QID) | RECTAL | Status: DC | PRN

## 2021-08-03 MED ORDER — MUPIROCIN 2 % EX OINT: 1.0000 "application " | TOPICAL_OINTMENT | Freq: Two times a day (BID) | CUTANEOUS | 0 refills | Status: DC

## 2021-08-03 MED ORDER — POLYETHYLENE GLYCOL 3350 17 G PO PACK: 17.0000 g | PACK | Freq: Every day | ORAL | Status: DC | PRN

## 2021-08-03 MED ORDER — ACETAMINOPHEN 325 MG PO TABS: 650.0000 mg | ORAL_TABLET | Freq: Four times a day (QID) | ORAL | Status: DC | PRN

## 2021-08-03 MED ORDER — ASPIRIN 81 MG PO TBEC: 81.0000 mg | DELAYED_RELEASE_TABLET | Freq: Every day | ORAL | 11 refills | Status: DC

## 2021-08-03 MED ORDER — ROSUVASTATIN CALCIUM 20 MG PO TABS: 20.0000 mg | ORAL_TABLET | Freq: Every day | ORAL | Status: DC

## 2021-08-03 MED ORDER — ASPIRIN EC 81 MG PO TBEC
81.0000 mg | DELAYED_RELEASE_TABLET | Freq: Every day | ORAL | Status: DC
  Administered 2021-08-04 – 2021-08-15 (×12): 81 mg via ORAL
  Filled 2021-08-03 (×12): qty 1

## 2021-08-03 MED ORDER — PNEUMOCOCCAL VAC POLYVALENT 25 MCG/0.5ML IJ INJ
0.5000 mL | INJECTION | INTRAMUSCULAR | Status: AC
  Administered 2021-08-04: 0.5 mL via INTRAMUSCULAR
  Filled 2021-08-03: qty 0.5

## 2021-08-03 MED ORDER — SENNOSIDES-DOCUSATE SODIUM 8.6-50 MG PO TABS: 1.0000 | ORAL_TABLET | Freq: Two times a day (BID) | ORAL | Status: AC

## 2021-08-03 NOTE — Progress Notes (Signed)
Inpatient Rehabilitation Admission Medication Review by a Pharmacist  A complete drug regimen review was completed for this patient to identify any potential clinically significant medication issues.  High Risk Drug Classes Is patient taking? Indication by Medication  Antipsychotic No   Anticoagulant No   Antibiotic No   Opioid No   Antiplatelet Yes Aspirin for CVA ppx  Hypoglycemics/insulin No   Vasoactive Medication No   Chemotherapy No   Other Yes Crestor for HLD     Type of Medication Issue Identified Description of Issue Recommendation(s)  Drug Interaction(s) (clinically significant)     Duplicate Therapy     Allergy     No Medication Administration End Date     Incorrect Dose     Additional Drug Therapy Needed     Significant med changes from prior encounter (inform family/care partners about these prior to discharge).    Other       Clinically significant medication issues were identified that warrant physician communication and completion of prescribed/recommended actions by midnight of the next day:  No  Pharmacist comments: None  Time spent performing this drug regimen review (minutes):  20 minutes   Tad Moore 08/03/2021 3:12 PM

## 2021-08-03 NOTE — Progress Notes (Signed)
Transported to [facility] for admission to Rehab. Patient left in chair, notified RN Mekides and unit secretary that patient requested to sit in chair rather than get in bed. Call bell within reach and tray table beside patient.

## 2021-08-03 NOTE — Progress Notes (Signed)
INPATIENT REHABILITATION ADMISSION NOTE   Arrival Method: Chair     Mental Orientation: A&Ox4   Assessment: done   Skin: done   IV'S: present on left upper arm   Pain: none   Tubes and Drains: none    Safety Measures: reviewed with pt   Vital Signs: done   Height and Weight: done   Rehab Orientation: done   Family: at bedside    Notes: done  Gerald Stabs, RN

## 2021-08-03 NOTE — TOC Transition Note (Signed)
Transition of Care [facility]) - CM/SW Discharge Note   Patient Details  Name: [name] MRN: [id number] Date of Birth: [birth year]  [facility]) CM/SW Contact:  Pollie Friar, RN Phone Number: 08/03/2021, 10:53 AM   Clinical Narrative:    Patient is discharging to CIR today. CM signing off.    Final next level of care: IP Rehab Facility Barriers to Discharge: No Barriers Identified   Patient Goals and CMS Choice     Choice offered to / list presented to : Patient  Discharge Placement                       Discharge Plan and Services                                     Social Determinants of Health (SDOH) Interventions     Readmission Risk Interventions No flowsheet data found.

## 2021-08-03 NOTE — Discharge Summary (Addendum)
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 Swallow whole. Start taking on: August 04, 2021   mupirocin ointment 2 % Commonly known as: BACTROBAN Place 1 application into the nose 2 (two) times daily for 2 days.   rosuvastatin 20 MG tablet Commonly known as: CRESTOR Take 1 tablet (20 mg total) by mouth daily. Start taking on: August 04, 2021   senna-docusate 8.6-50 MG tablet Commonly known as: Senokot-S Take 1 tablet by mouth 2 (two) times daily.        LABORATORY STUDIES CBC    Component Value Date/Time   WBC 6.9 07/31/2021 0652   RBC 3.91 07/31/2021 0652   HGB 11.8 (L) 07/31/2021 0652   HCT 36.3 07/31/2021 0652   PLT 179 07/31/2021 0652   MCV 92.8 07/31/2021 0652   MCH 30.2 07/31/2021 0652   MCHC 32.5 07/31/2021 0652   RDW 14.8 07/31/2021 0652   LYMPHSABS 1.5 07/28/2021 2212   MONOABS 0.3 07/28/2021 2212   EOSABS 0.0 07/28/2021 2212   BASOSABS 0.0 07/28/2021 2212   CMP    Component Value Date/Time   NA 146 (H) 08/01/2021 0334   K 3.9  07/30/2021 2133   CL 115 (H) 07/30/2021 2133   CO2 24 07/30/2021 2133   GLUCOSE 119 (H) 07/30/2021 2133   BUN 6 07/30/2021 2133   CREATININE 0.66 07/30/2021 2133   CALCIUM 8.4 (L) 07/30/2021 2133   PROT 7.0 07/28/2021 2322   ALBUMIN 3.7 07/28/2021 2322   AST 16 07/28/2021 2322   ALT 11 07/28/2021 2322   ALKPHOS 63 07/28/2021 2322   BILITOT 0.5 07/28/2021 2322   GFRNONAA >60 07/30/2021 2133   COAGS Lab Results  Component Value Date   INR 1.0 07/30/2021   Lipid Panel    Component Value Date/Time   CHOL 215 (H) 07/29/2021 1048   TRIG 76 07/29/2021 1048   HDL 39 (L) 07/29/2021 1048   CHOLHDL 5.5 07/29/2021 1048   VLDL 15 07/29/2021 1048   LDLCALC 161 (H) 07/29/2021 1048   HgbA1C  Lab Results  Component Value Date   HGBA1C 6.1 (H) 07/29/2021   Urinalysis    Component Value Date/Time   COLORURINE YELLOW 07/28/2021 [place] 07/28/2021 2212   LABSPEC 1.022 07/28/2021 2212   PHURINE 6.0 07/28/2021 [place] 07/28/2021 2212   HGBUR NEGATIVE 07/28/2021 2212   BILIRUBINUR NEGATIVE 07/28/2021 2212   BILIRUBINUR moderate 06/21/2013 1615   [place] 07/28/2021 2212  PROTEINUR TRACE (A) 07/28/2021 2212   UROBILINOGEN 0.2 06/21/2013 1716   UROBILINOGEN 0.2 06/21/2013 1615   NITRITE NEGATIVE 07/28/2021 2212   LEUKOCYTESUR NEGATIVE 07/28/2021 2212   Urine Drug Screen No results found for: LABOPIA, COCAINSCRNUR, LABBENZ, AMPHETMU, THCU, LABBARB  Alcohol Level No results found for: [facility]   SIGNIFICANT DIAGNOSTIC STUDIES CT ANGIO HEAD NECK W WO CM  Result Date: 07/29/2021 CLINICAL DATA:  Stroke, determine embolic source. EXAM: CT ANGIOGRAPHY HEAD AND NECK TECHNIQUE: Multidetector CT imaging of the head and neck was performed using the standard protocol during bolus administration of intravenous contrast. Multiplanar CT image reconstructions and MIPs were obtained to evaluate the vascular anatomy. Carotid stenosis measurements (when  applicable) are obtained utilizing NASCET criteria, using the distal internal carotid diameter as the denominator. RADIATION DOSE REDUCTION: This exam was performed according to the departmental dose-optimization program which includes automated exposure control, adjustment of the mA and/or kV according to patient size and/or use of iterative reconstruction technique. CONTRAST:  9mL OMNIPAQUE IOHEXOL 350 MG/ML SOLN COMPARISON:  Head CT and brain MRI from earlier the same day FINDINGS: CTA NECK FINDINGS Aortic arch: Atheromatous plaque.  Three vessel branching. Right carotid system: Atheromatous plaque at the bifurcation. No stenosis or ulceration. Left carotid system: Mild atheromatous plaque at the bifurcation. No stenosis or ulceration. Vertebral arteries: No proximal subclavian stenosis. 70% stenosis at the right vertebral origin and even greater at the right V2 segment. 55% stenosis at the left vertebral origin as measured on sagittal reformats. No beading or discrete ulceration. Skeleton: No acute finding Other neck: No acute finding Upper chest: Clear apical lungs Review of the MIP images confirms the above findings CTA HEAD FINDINGS Anterior circulation: Negative for branch occlusion, beading, or aneurysm. Limited by venous contamination Posterior circulation: Negative for branch occlusion, beading, or aneurysm. Limited by venous contamination Venous sinuses: Diffusely patent Anatomic variants: None significant Review of the MIP images confirms the above findings IMPRESSION: 1. 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 2. 55% narrowing at the left vertebral origin. 3. No arterial ulceration or beading. Electronically Signed   By: Jorje Guild M.D.   On: 07/29/2021 12:01   CT HEAD WO CONTRAST (5MM)  Result Date: 08/01/2021 CLINICAL DATA:  61 year old female with right cerebellar, left PCA territory infarcts. EXAM: CT  HEAD WITHOUT CONTRAST TECHNIQUE: Contiguous axial images were obtained from the base of the skull through the vertex without intravenous contrast. RADIATION DOSE REDUCTION: This exam was performed according to the departmental dose-optimization program which includes automated exposure control, adjustment of the mA and/or kV according to patient size and/or use of iterative reconstruction technique. COMPARISON:  Head CT 07/30/2021 and earlier. FINDINGS: Brain: Cytotoxic edema in the right cerebellum is stable with mild mass effect on the 4th ventricle. Basilar cisterns remain patent. No tonsillar herniation. Stable cytotoxic edema in the inferior left occipital lobe and left occipital pole. No regional mass effect. Scattered additional bilateral white matter hypodensity is stable. No midline shift, ventriculomegaly, mass effect, evidence of mass lesion, intracranial hemorrhage or new cortically based acute infarction. Vascular: No suspicious intracranial vascular hyperdensity. Skull: Negative. Sinuses/Orbits: Visualized paranasal sinuses and mastoids are stable and well aerated. Other: Visualized orbits and scalp soft tissues are within normal limits. IMPRESSION: 1. 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 Stable mild mass effect on the 4th ventricle. No hemorrhagic transformation or other complicating features. 2. No new intracranial abnormality.  Electronically Signed   By: Genevie Ann M.D.   On: 08/01/2021 10:48   CT HEAD WO CONTRAST (5MM)  Result Date: 07/30/2021 CLINICAL DATA:  Stroke, follow-up EXAM: CT HEAD WITHOUT CONTRAST TECHNIQUE: Contiguous axial images were obtained from the base of the skull through the vertex without intravenous contrast. RADIATION DOSE REDUCTION: This exam was performed according to the departmental dose-optimization program which includes automated exposure control, adjustment of the mA and/or kV according to patient size and/or use of iterative reconstruction  technique. COMPARISON:  07/30/2021 6:05 a.m. FINDINGS: Brain: Redemonstrated infarcts in the right cerebellum and left occipital lobe, not significantly changed from prior exam. Mild mass effect in the posterior fossa, with crowding around the fourth ventricle, but no hydrocephalus. No new areas of infarction. No acute hemorrhage or midline shift. No extra-axial collection. Vascular: No hyperdense vessel. Skull: Normal. Negative for fracture or focal lesion. Sinuses/Orbits: No acute finding. Other: The mastoids are well aerated. IMPRESSION: Unchanged appearance of right cerebellar and left occipital lobe infarcts, without evidence of hemorrhagic transformation or progression of infarcts. Electronically Signed   By: Merilyn Baba M.D.   On: 07/30/2021 19:50   CT HEAD WO CONTRAST (5MM)  Result Date: 07/30/2021 CLINICAL DATA:  Stroke follow-up. EXAM: CT HEAD WITHOUT CONTRAST TECHNIQUE: Contiguous axial images were obtained from the base of the skull through the vertex without intravenous contrast. RADIATION DOSE REDUCTION: This exam was performed according to the departmental dose-optimization program which includes automated exposure control, adjustment of the mA and/or kV according to patient size and/or use of iterative reconstruction technique. COMPARISON:  CT from yesterday FINDINGS: Brain: Known acute infarcts in the right cerebellum and left occipital lobe. Posterior fossa mass effect and fourth ventricular crowding but no hydrocephalus. Chronic small vessel ischemia in the hemispheric white matter. No hematoma or visible interval infarct. Vascular: No hyperdense vessel or unexpected calcification. Skull: Normal. Negative for fracture or focal lesion. Sinuses/Orbits: No acute finding. IMPRESSION: No interval hemorrhage or visible infarct progression. No hydrocephalus. Electronically Signed   By: Jorje Guild M.D.   On: 07/30/2021 06:10   CT HEAD WO CONTRAST (5MM)  Result Date: 07/30/2021 CLINICAL  DATA:  Follow-up left occipital, right cerebellar infarctions. EXAM: CT HEAD WITHOUT CONTRAST TECHNIQUE: Contiguous axial images were obtained from the base of the skull through the vertex without intravenous contrast. RADIATION DOSE REDUCTION: This exam was performed according to the departmental dose-optimization program which includes automated exposure control, adjustment of the mA and/or kV according to patient size and/or use of iterative reconstruction technique. COMPARISON:  MRI brain today at 8:07 a.m., head CT today at 2:04 a.m. FINDINGS: Brain: In the mid to lower right cerebellar hemisphere there is increasing edema consistent with liquefaction necrosis and increasing heterogeneous hypodense parenchymal attenuation. There is increased mass effect in the posterior fossa noted with an effaced right cerebellomedullary cistern and a partially effaced right cerebellopontine angle cistern, as well as posterior fossa right-to-left midline shift up to 5 mm and moderate effacement of fourth ventricle in comparison to the earlier studies There are several scattered punctate foci of petechial hemorrhage within the infarcting tissue. Patchy cortical infarction along the medial and posterior left occipital cortex was better seen on MRI but probably is not significantly changed. There is no extra-axial hemorrhage no appreciable new cortical based infarct. There is slight cortical atrophy of the cerebral hemispheres and mild-to-moderate small-vessel disease the cerebral white matter. There is no supratentorial midline shift or mass effect. No hydrocephalus. Vascular: No hyperdense vessel or  unexpected calcification. Skull: Normal. Negative for fracture or focal lesion. Sinuses/Orbits: No acute finding. Other: None. IMPRESSION: 1. Increased edema and heterogeneity in the right cerebellar hemisphere in the infarcting tissue with new scattered petechial hemorrhaging. New extra-axial bleed is visible. 2. Increased right  posterior fossa mass effect with effaced right cerebellomedullary cistern and partially effaced right CP angle cistern, 5 mm right-to-left posterior fossa midline shift, and moderate effacement of the fourth ventricle. There is no increased upstream hydrocephalus. 3. Patchy left occipital cortical infarction is not as well seen as on MRI but probably unchanged. There is no associated significant mass effect. Electronically Signed   By: Telford Nab M.D.   On: 07/30/2021 00:07   CT Head Wo Contrast  Result Date: 07/29/2021 CLINICAL DATA:  Fatigue and nausea, initial encounter EXAM: CT HEAD WITHOUT CONTRAST TECHNIQUE: Contiguous axial images were obtained from the base of the skull through the vertex without intravenous contrast. RADIATION DOSE REDUCTION: This exam was performed according to the departmental dose-optimization program which includes automated exposure control, adjustment of the mA and/or kV according to patient size and/or use of iterative reconstruction technique. COMPARISON:  None. FINDINGS: Brain: No evidence of acute infarction, hemorrhage, hydrocephalus, extra-axial collection or mass lesion/mass effect. Mild chronic white matter ischemic changes noted. Vascular: No hyperdense vessel or unexpected calcification. Skull: Normal. Negative for fracture or focal lesion. Sinuses/Orbits: No acute finding. Other: None. IMPRESSION: Mild chronic white matter ischemic changes. No acute abnormality noted. Electronically Signed   By: Inez Catalina M.D.   On: 07/29/2021 02:23   MR BRAIN WO CONTRAST  Result Date: 07/29/2021 CLINICAL DATA:  Neuro deficit with acute stroke suspected EXAM: MRI HEAD WITHOUT CONTRAST TECHNIQUE: Multiplanar, multiecho pulse sequences of the brain and surrounding structures were obtained without intravenous contrast. COMPARISON:  Head CT from earlier today FINDINGS: Brain: Extensive acute infarction in the right cerebellum, most confluent inferiorly. Nodulus infarct and patchy  left occipital cortex infarction. No acute infarct in the anterior circulation. Patchy FLAIR hyperintensity in the cerebral white matter usually from chronic small vessel ischemia. No hemorrhage, hydrocephalus, or masslike finding. Vascular: Major flow voids are preserved Skull and upper cervical spine: Normal marrow signal Sinuses/Orbits: Negative IMPRESSION: Acute infarcts in the right cerebellum and left occipital cortex suggesting posterior circulation emboli. Patent fourth ventricle. Moderate chronic white matter disease. Electronically Signed   By: Jorje Guild M.D.   On: 07/29/2021 08:28   ECHOCARDIOGRAM COMPLETE BUBBLE STUDY  Result Date: 07/30/2021    ECHOCARDIOGRAM REPORT   Patient Name:   AIDEN RAO Date of Exam: 07/30/2021 Medical Rec #:  [id number]     Height:       63.5 in Accession #:    [id number]    Weight:       149.0 lb Date of Birth:  Dec 11, 1960     BSA:          1.716 m Patient Age:    61 years      BP:           121/58 mmHg Patient Gender: F             HR:           56 bpm. Exam Location:  Inpatient Procedure: 2D Echo, 3D Echo, Cardiac Doppler, Color Doppler, Strain Analysis and            Saline Contrast Bubble Study Indications:    Stroke 434.91 / I63.9  History:        Patient  has no prior history of Echocardiogram examinations.                 Risk Factors:Dyslipidemia.  Sonographer:    Darlina Sicilian RDCS Referring Phys: [place]  1. Left ventricular ejection fraction, by estimation, is 55 to 60%. Left ventricular ejection fraction by 3D volume is 58 %. The left ventricle has normal function. The left ventricle has no regional wall motion abnormalities. Left ventricular diastolic  parameters were normal.  2. Right ventricular systolic function is normal. The right ventricular size is normal. There is normal pulmonary artery systolic pressure. The estimated right ventricular systolic pressure is 51.0 mmHg.  3. The mitral valve is normal in structure. No  evidence of mitral valve regurgitation.  4. The aortic valve is tricuspid. Aortic valve regurgitation is not visualized.  5. The inferior vena cava is normal in size with <50% respiratory variability, suggesting right atrial pressure of 8 mmHg.  6. Agitated saline contrast bubble study was negative, with no evidence of any interatrial shunt. Comparison(s): No prior Echocardiogram. FINDINGS  Left Ventricle: Left ventricular ejection fraction, by estimation, is 55 to 60%. Left ventricular ejection fraction by 3D volume is 58 %. The left ventricle has normal function. The left ventricle has no regional wall motion abnormalities. The left ventricular internal cavity size was normal in size. There is no left ventricular hypertrophy. Left ventricular diastolic parameters were normal. Right Ventricle: The right ventricular size is normal. No increase in right ventricular wall thickness. Right ventricular systolic function is normal. There is normal pulmonary artery systolic pressure. The tricuspid regurgitant velocity is 1.93 m/s, and  with an assumed right atrial pressure of 8 mmHg, the estimated right ventricular systolic pressure is 25.8 mmHg. Left Atrium: Left atrial size was normal in size. Right Atrium: Right atrial size was normal in size. Pericardium: There is no evidence of pericardial effusion. Mitral Valve: The mitral valve is normal in structure. No evidence of mitral valve regurgitation. Tricuspid Valve: The tricuspid valve is grossly normal. Tricuspid valve regurgitation is trivial. Aortic Valve: The aortic valve is tricuspid. Aortic valve regurgitation is not visualized. Pulmonic Valve: The pulmonic valve was normal in structure. Pulmonic valve regurgitation is not visualized. Aorta: The aortic root and ascending aorta are structurally normal, with no evidence of dilitation. Venous: The inferior vena cava is normal in size with less than 50% respiratory variability, suggesting right atrial pressure of 8  mmHg. IAS/Shunts: No atrial level shunt detected by color flow Doppler. Agitated saline contrast was given intravenously to evaluate for intracardiac shunting. Agitated saline contrast bubble study was negative, with no evidence of any interatrial shunt.  LEFT VENTRICLE PLAX 2D LVIDd:         4.20 cm         Diastology LVIDs:         3.00 cm         LV e' medial:    10.60 cm/s LV PW:         0.70 cm         LV E/e' medial:  6.8 LV IVS:        0.80 cm         LV e' lateral:   14.60 cm/s LVOT diam:     1.70 cm         LV E/e' lateral: 4.9 LV SV:         46 LV SV Index:   27 LVOT Area:  2.27 cm        3D Volume EF                                LV 3D EF:    Left                                             ventricul                                             ar                                             ejection                                             fraction                                             by 3D                                             volume is                                             58 %.                                 3D Volume EF:                                3D EF:        58 %                                LV EDV:       94 ml                                LV ESV:       39 ml                                LV SV:        54 ml RIGHT VENTRICLE RV S prime:     11.90 cm/s TAPSE (M-mode): 2.3 cm LEFT ATRIUM             Index  RIGHT ATRIUM           Index LA diam:        2.80 cm 1.63 cm/m   RA Area:     11.50 cm LA Vol (A2C):   30.5 ml 17.77 ml/m  RA Volume:   24.20 ml  14.10 ml/m LA Vol (A4C):   25.6 ml 14.91 ml/m LA Biplane Vol: 29.2 ml 17.01 ml/m  AORTIC VALVE LVOT Vmax:   89.40 cm/s LVOT Vmean:  64.100 cm/s LVOT VTI:    0.202 m  AORTA Ao Root diam: 2.50 cm MITRAL VALVE               TRICUSPID VALVE MV Area (PHT): 6.71 cm    TR Peak grad:   14.9 mmHg MV Decel Time: 113 msec    TR Vmax:        193.00 cm/s MV E velocity: 71.60 cm/s MV A velocity: 38.10 cm/s  SHUNTS MV E/A  ratio:  1.88        Systemic VTI:  0.20 m                            Systemic Diam: 1.70 cm Lyman Bishop MD Electronically signed by Lyman Bishop MD Signature Date/Time: 07/30/2021/1:54:04 PM    Final        HISTORY OF PRESENT ILLNESS  NORETTA FRIER is a 61 y.o. female with a medical history significant for hyperlipidemia, tobacco use, and vaginal fibroids who was seen on 1/14 at urgent care for complaints of headache, nausea and vomiting, and fever without relief with BC powder at home and during evaluation she came lightheaded and was sent to the emergency room for evaluation of dehydration. On evaluation in the ED, patient was complaining of lethargy, nausea and vomiting without response to medication.  CT head was obtained without acute abnormality. She was also noted to be unsteady with standing with a staggering gait and she was sent to [facility] for evaluation of possible central process. MRI brain was obtained on arrival to [facility] revealing acute infarcts in the right cerebellum and left occipital cortex suggesting posterior circulation emboli and neurology was consulted for further evaluation.  Patient does complain of feeling palpitations on 1/14 during the acute onset of her other presenting symptoms.  LKW: 1/14 12:00 TNK given?: no, patient is outside of the window for thrombolytic therapy IR Thrombectomy? No, patient's presentation is not consistent with LVO Modified Rankin Scale: 0-Completely asymptomatic and back to baseline post- stroke3.    HOSPITAL COURSE 1/15 CT head showed no acute abnormality but mild chronic white matter ischemic changes. CTA head & neck High grade narrowing of non dominant R vertebral artery 70% at origin and even greater at V2 segment, 55% narrowing of L vertebral origin. MRI showed acute infarcts in R cerebellum and L occipital cortex. 2D echo showed EF55-60%. On 1/15, repeat CT showed increased edema and heterogeneity in R cerebellar hemisphere in infarcting  tissue w/ new scattered petechial hemorrhaging as well as increased R posterior fossa mass effect with effaced R cerebellomedullary cistern and partially effaced right CP angle cistern, 5 mm R to L midline shift concerning for cytotoxic edema so patient was transferred to the ICU and put on hypertonic saline. Patient tolerated this well and was having gradual improvements in neurological symptoms. Repeat CT on 1/16 and 1/18 indicated no further increases in cerebral edema but did show stable mild mass effect on  the 4th ventricle so patient was weaned off hypertonic saline and transferred out of the ICU.  She was started on aspirin along for stroke prevention and not on Plavix due to hemorrhagic transformation and fear of increasing the hemorrhage.  DISCHARGE EXAM Blood pressure (!) 150/75, pulse 68, temperature 98 F (36.7 C), temperature source Oral, resp. rate 16, height 5\' 3"  (1.6 m), weight 69.3 kg, last menstrual period 07/15/2009, SpO2 100 %.  Temp:  [97.8 F (36.6 C)-98.7 F (37.1 C)] 98 F (36.7 C) (01/20 1141) Pulse Rate:  [51-68] 68 (01/20 1141) Resp:  [11-20] 16 (01/20 1141) BP: (115-153)/(58-96) 150/75 (01/20 1141) SpO2:  [88 %-100 %] 100 % (01/20 1141) Weight:  [69.3 kg-70.3 kg] 69.3 kg (01/20 0559)  General - Well nourished, well developed pleasant middle-aged African-American lady, in no apparent distress.    Cardiovascular - Regular rhythm and rate.   Mental Status -  Level of arousal and orientation to time, place, and person were intact. Language including expression, naming, repetition, comprehension was assessed and found intact. Attention span and concentration were normal. Recent and remote memory were intact. Fund of Knowledge was assessed and was intact.   Cranial Nerves II - XII - II - Visual field intact OU. III, IV, VI - Extraocular movements intact.  Mild saccadic dysmetria on lateral gaze V - Facial sensation intact bilaterally. VII - Facial movement intact  bilaterally. VIII - Hearing & vestibular intact bilaterally. X - Palate elevates symmetrically. XI - Chin turning & shoulder shrug intact bilaterally. XII - Tongue protrusion intact.   Motor Strength - The patients strength was normal in all extremities and pronator drift was absent.  Bulk was normal and fasciculations were absent.   Motor Tone - Muscle tone was assessed at the neck and appendages and was normal.   Reflexes - The patients reflexes were symmetrical in all extremities and she had no pathological reflexes.   Sensory - Light touch, temperature/pinprick were assessed and were symmetrical.     Coordination - The patient had normal movements in the hands and feet with no ataxia or dysmetria.  Tremor was absent.   Gait and Station - deferred.  Discharge Diet      Diet   Diet regular Room service appropriate? Yes with Assist; Fluid consistency: Thin   liquids  DISCHARGE PLAN Disposition:  Transfer to [facility] for ongoing PT, OT and ST aspirin 81 mg daily for secondary stroke prevention indefinitely. Recommend ongoing stroke risk factor control by Primary Care Physician at time of discharge from inpatient rehabilitation. Follow-up PCP Jearld Fenton, NP in 2 weeks following discharge from rehab. Follow-up in [facility] in 8 weeks following discharge from rehab, office to schedule an appointment.   45 minutes were spent preparing discharge.  France Ravens, MD PGY1 Resident  I have personally obtained history,examined this patient, reviewed notes, independently viewed imaging studies, participated in medical decision making and plan of care.ROS completed by me personally and pertinent positives fully documented  I have made any additions or clarifications directly to the above note. Agree with note above.  Patient was started on single agent aspirin alone and not a dual antiplatelet therapy due to hemorrhagic transformation  on the CT scan.  She will need follow-up CT angiogram and office follow-up visit in 2 to 3 months  Antony Contras, [place] Pager: [phone] 08/03/2021 3:27 PM

## 2021-08-03 NOTE — H&P (Signed)
Physical Medicine and Rehabilitation Admission H&P    Chief Complaint  Patient presents with   Emesis   CVA  : HPI: [name] is a 62 year old right-handed female with history of hyperlipidemia as well as prediabetes, tobacco use.  Patient on no prescription medications.  Per chart review patient lives with her mother.  1 level apartment.  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  Presented 07/28/2021 with dizziness, unsteady gait and bouts of vomiting.  She had initially been seen at the urgent care 07/28/2021 for headache nausea vomiting and low-grade fever without relief with BC powder.  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  She was initially placed on hypertonic saline.  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  55% narrowing at the left vertebral origin.  Patient did not receive tPA.  Echocardiogram with ejection fraction of 55 to 60% no wall motion abnormalities.  Admission chemistries unremarkable except glucose 125, troponin negative, hemoglobin A1c 6.1, urinalysis negative nitrite.  Neurology follow-up currently maintained on low-dose aspirin for CVA prophylaxis.  Latest follow-up cranial CT scan 08/01/2021 stable no hemorrhagic transformation no new intracranial abnormality.  Tolerating a regular diet.  Therapy evaluations completed due to patient decreased functional mobility was admitted for a comprehensive rehab program.  Review of Systems  Constitutional:  Positive for fever. Negative for malaise/fatigue.  HENT:  Negative for hearing loss.   Eyes:  Negative for blurred vision and double vision.  Respiratory:  Negative for cough and shortness of breath.   Cardiovascular:  Negative for chest pain, palpitations and leg swelling.  Gastrointestinal:  Positive for nausea and vomiting.  Genitourinary:  Negative for dysuria, flank pain and  hematuria.  Skin:  Negative for rash.  Neurological:  Positive for dizziness and headaches.  All other systems reviewed and are negative. Past Medical History:  Diagnosis Date   Blood in stool    Hyperlipidemia    Vaginal fibroids    History reviewed. No pertinent surgical history. Family History  Problem Relation Age of Onset   Diabetes Father    Hypertension Father    Heart disease Father    Hyperlipidemia Father    Stroke Father    Cancer Neg Hx    Social History:  reports that she has quit smoking. She does not have any smokeless tobacco history on file. She reports that she does not drink alcohol and does not use drugs. Allergies:  Allergies  Allergen Reactions   Amoxicillin-Pot Clavulanate Itching and Rash   Medications Prior to Admission  Medication Sig Dispense Refill   Aspirin-Acetaminophen-Caffeine (GOODY HEADACHE PO) Take 1 Dose by mouth every 6 (six) hours as needed (pain, headache).     ondansetron (ZOFRAN-ODT) 4 MG disintegrating tablet Take 1 tablet (4 mg total) by mouth every 8 (eight) hours as needed for nausea or vomiting. (Patient not taking: Reported on 07/29/2021) 20 tablet 0    Drug Regimen Review Drug regimen was reviewed and remains appropriate with no significant issues identified  Home: Home Living Family/patient expects to be discharged to:: Private residence Living Arrangements: Parent (Mother) Available Help at Discharge: Family, Available 24 hours/day Type of Home: Apartment Home Access: Level entry Home Layout: One level Bathroom Shower/Tub: Chiropodist: Standard Home Equipment: None 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   Functional History: Prior Function Prior Level of Function : Independent/Modified  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  Functional Status:  Mobility: Bed Mobility Overal bed mobility: Needs Assistance Bed Mobility: Supine to Sit Supine to  sit: Min guard, HOB elevated Sit to supine: Min assist, HOB elevated, +2 for safety/equipment General bed mobility comments: up with RN Transfers Overall transfer level: Needs assistance Equipment used: None Transfers: Sit to/from Stand Sit to Stand: Min assist General transfer comment: light steadying assist when rising without UE support Ambulation/Gait Ambulation/Gait assistance: Min guard, Min assist Gait Distance (Feet): 150 Feet (+75 - seated rest break needed) Assistive device: Rolling walker (2 wheels), 1 person hand held assist Gait Pattern/deviations: Step-through pattern, Decreased stride length, Narrow base of support General Gait Details: close guard when ambulatory with RW, min assist to steady with HHA only and pt very apprehensive, tolerated 35 ft ambulation without AD. Cues for widening BOS, upright posture, looking forward as opposed to down Gait velocity: decr    ADL: ADL Overall ADL's : Needs assistance/impaired Eating/Feeding: Minimal assistance, Bed level Grooming: Wash/dry face, Oral care, Standing, Min guard Grooming Details (indicate cue type and reason): min guard for safety d/t reports of intermittent dizziness Upper Body Bathing: Moderate assistance, Sitting Lower Body Bathing: Maximal assistance, Bed level, Sitting/lateral leans Upper Body Dressing : Moderate assistance, Sitting Lower Body Dressing: Maximal assistance, Sitting/lateral leans, Bed level Toilet Transfer: Min guard, Ambulation, Rolling walker (2 wheels) Toilet Transfer Details (indicate cue type and reason): simulated via functional mobility, MIN verbal cues for rw mgmt Functional mobility during ADLs: Min guard, Rolling walker (2 wheels) General ADL Comments: pt continues to present with dizziness, impaired balance and generalized deconditioning however dizziness greatly improved from previous therapy session  Cognition: Cognition Overall Cognitive Status: Within Functional Limits for  tasks assessed Orientation Level: Oriented X4 Cognition Arousal/Alertness: Awake/alert Behavior During Therapy: Flat affect, WFL for tasks assessed/performed Overall Cognitive Status: Within Functional Limits for tasks assessed Area of Impairment: Attention, Following commands, Safety/judgement, Problem solving Current Attention Level: Selective Following Commands: Follows one step commands with increased time Safety/Judgement: Decreased awareness of deficits Problem Solving: Slow processing, Decreased initiation, Difficulty sequencing, Requires verbal cues, Requires tactile cues General Comments: AxOx4, following all commands, initially very flat and emotional but conversated more as session progressed, pt asking appropriate questions about rehab and POC  Physical Exam: Blood pressure 135/69, pulse (!) 56, temperature 97.9 F (36.6 C), temperature source Oral, resp. rate 16, height 5\' 3"  (1.6 m), weight 69.3 kg, last menstrual period 07/15/2009, SpO2 100 %. Physical Exam Constitutional:      Appearance: Normal appearance.  HENT:     Head: Normocephalic and atraumatic.     Right Ear: External ear normal.     Left Ear: External ear normal.     Nose: Nose normal.     Mouth/Throat:     Mouth: Mucous membranes are moist.     Pharynx: Oropharynx is clear.  Eyes:     Extraocular Movements: Extraocular movements intact.     Conjunctiva/sclera: Conjunctivae normal.     Pupils: Pupils are equal, round, and reactive to light.  Cardiovascular:     Rate and Rhythm: Normal rate and regular rhythm.     Heart sounds: No murmur heard.   No gallop.  Pulmonary:     Effort: Pulmonary effort is normal. No respiratory distress.     Breath sounds: Normal breath sounds. No wheezing.  Abdominal:     General: Abdomen is flat. There is no distension.     Palpations: Abdomen  is soft.     Tenderness: There is no abdominal tenderness.  Musculoskeletal:        General: No swelling. Normal range of  motion.     Cervical back: Normal range of motion.     Comments: Mild tenderness over the dorsolateral aspect of left metatarsals  Skin:    General: Skin is warm and dry.     Comments: Long pink finger nails  Neurological:     Mental Status: She is alert.     Comments: Patient is alert.  No acute distress.  Makes eye contact with examiner.  Follows commands.  Provides name and age.  Fair insight and awareness. Alert and oriented x 3. Normal insight and awareness. Intact Memory. Normal language and speech. Cranial nerve exam unremarkable. Good sitting balance. Strength 4-5/5 in all 4's. Sensation intact to LT and pain in all 4's. Decreased FTN, FMC RUE>LUE. DTR's 2+  Psychiatric:        Mood and Affect: Mood normal.        Behavior: Behavior normal.    Results for orders placed or performed during the hospital encounter of 07/28/21 (from the past 48 hour(s))  Glucose, capillary     Status: None   Collection Time: 08/02/21  8:30 AM  Result Value Ref Range   Glucose-Capillary 81 70 - 99 mg/dL    Comment: Glucose reference range applies only to samples taken after fasting for at least 8 hours.   CT HEAD WO CONTRAST (5MM)  Result Date: 08/01/2021 CLINICAL DATA:  61 year old female with right cerebellar, left PCA territory infarcts. EXAM: CT HEAD WITHOUT CONTRAST TECHNIQUE: Contiguous axial images were obtained from the base of the skull through the vertex without intravenous contrast. RADIATION DOSE REDUCTION: This exam was performed according to the departmental dose-optimization program which includes automated exposure control, adjustment of the mA and/or kV according to patient size and/or use of iterative reconstruction technique. COMPARISON:  Head CT 07/30/2021 and earlier. FINDINGS: Brain: Cytotoxic edema in the right cerebellum is stable with mild mass effect on the 4th ventricle. Basilar cisterns remain patent. No tonsillar herniation. Stable cytotoxic edema in the inferior left occipital  lobe and left occipital pole. No regional mass effect. Scattered additional bilateral white matter hypodensity is stable. No midline shift, ventriculomegaly, mass effect, evidence of mass lesion, intracranial hemorrhage or new cortically based acute infarction. Vascular: No suspicious intracranial vascular hyperdensity. Skull: Negative. Sinuses/Orbits: Visualized paranasal sinuses and mastoids are stable and well aerated. Other: Visualized orbits and scalp soft tissues are within normal limits. IMPRESSION: 1. 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 Stable mild mass effect on the 4th ventricle. No hemorrhagic transformation or other complicating features. 2. No new intracranial abnormality. Electronically Signed   By: Genevie Ann M.D.   On: 08/01/2021 10:48       Medical Problem List and Plan: 1. 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  Antithrombotics: -DVT/anticoagulation:  Mechanical:  Antiembolism stockings, knee (TED hose) Bilateral lower extremities  -antiplatelet therapy: Aspirin 81 mg daily 3. Pain Management: Tylenol as needed 4. Mood: Provide emotional support  -antipsychotic agents: N/A 5. Neuropsych: This patient is capable of making decisions on her own behalf. 6. Skin/Wound Care: Routine skin checks 7. Fluids/Electrolytes/Nutrition: Routine in and outs with follow-up chemistries 8.  Hyperlipidemia.  Crestor 9.  Prediabetes.  Hemoglobin A1c 6.1.  SSI discontinued.      Lavon Paganini Angiulli, PA-C 08/03/2021

## 2021-08-03 NOTE — Progress Notes (Signed)
Physical Therapy Treatment Patient Details Name: [name] MRN: [id number] DOB: 1960/08/24 Today's Date: 08/03/2021   History of Present Illness 61 yo female presenting to ED on 1/14 with nausea and vomiting when standing up. 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 1/16 CTH shows worsening cerebellar edema with potential petechial hemorrhage; worsening mass effect in right posterior fossa and new right-to-left midline shift. PMH including hyperlipidemia.    PT Comments    Pt was seen for final visit before leaving for CIR.  Her plan is to progress with balance skills in sensory and motor challenges, and of note is struggling with tandem, eyes closed and walking backward.  However, pt is motivated for the challenge and is working to increase her self control of the deficits that are hindering mobility.  Continue with CIR now, and will anticipate her transition home after that stay.   Recommendations for follow up therapy are one component of a multi-disciplinary discharge planning process, led by the attending physician.  Recommendations may be updated based on patient status, additional functional criteria and insurance authorization.  Follow Up Recommendations  Acute inpatient rehab (3hours/day)     Assistance Recommended at Discharge Intermittent Supervision/Assistance  Patient can return home with the following A little help with walking and/or transfers;A little help with bathing/dressing/bathroom;Assistance with cooking/housework;Assist for transportation;Help with stairs or ramp for entrance   Equipment Recommendations  None recommended by PT (allow rehab to determine)    Recommendations for Other Services Rehab consult     Precautions / Restrictions Precautions Precautions: Fall Restrictions Weight Bearing Restrictions: No     Mobility  Bed Mobility Overal bed mobility: Needs  Assistance Bed Mobility: Supine to Sit     Supine to sit: Min guard          Transfers Overall transfer level: Needs assistance Equipment used: None Transfers: Sit to/from Stand Sit to Stand: Min assist           General transfer comment: pt continued to practice with steadily better results    Ambulation/Gait Ambulation/Gait assistance: Min guard Gait Distance (Feet): 30 Feet Assistive device: 1 person hand held assist Gait Pattern/deviations: Step-through pattern, Wide base of support, Drifts right/left Gait velocity: reduced     General Gait Details: pt was more alert to her closeness to obstacles today, but requires instruction to slow down and attend to balance control   Stairs             Wheelchair Mobility    Modified Rankin (Stroke Patients Only) Modified Rankin (Stroke Patients Only) Pre-Morbid Rankin Score: No symptoms Modified Rankin: Moderately severe disability     Balance Overall balance assessment: Needs assistance Sitting-balance support: Feet supported Sitting balance-Leahy Scale: Fair     Standing balance support: Single extremity supported Standing balance-Leahy Scale: Poor                              Cognition Arousal/Alertness: Awake/alert Behavior During Therapy: Flat affect, WFL for tasks assessed/performed Overall Cognitive Status: Impaired/Different from baseline Area of Impairment: Safety/judgement, Following commands, Attention                   Current Attention Level: Selective   Following Commands: Follows one step commands with increased time Safety/Judgement: Decreased awareness of deficits   Problem Solving: Slow processing, Requires verbal cues, Requires tactile cues  Exercises      General Comments General comments (skin integrity, edema, etc.): pt worked on [employer] and [employer] with cues for safety and awareness laterally.  Min guard for tandem and eyes closed, min assist  to walk tandem and backward      Pertinent Vitals/Pain Pain Assessment Pain Assessment: No/denies pain    Home Living                          Prior Function            PT Goals (current goals can now be found in the care plan section) Acute Rehab PT Goals Patient Stated Goal: to get home when able Progress towards PT goals: Progressing toward goals    Frequency    Min 4X/week      PT Plan Current plan remains appropriate    Co-evaluation              AM-PAC PT "6 Clicks" Mobility   Outcome Measure  Help needed turning from your back to your side while in a flat bed without using bedrails?: A Little Help needed moving from lying on your back to sitting on the side of a flat bed without using bedrails?: A Little Help needed moving to and from a bed to a chair (including a wheelchair)?: A Little Help needed standing up from a chair using your arms (e.g., wheelchair or bedside chair)?: A Little Help needed to walk in hospital room?: A Little Help needed climbing 3-5 steps with a railing? : A Lot 6 Click Score: 17    End of Session Equipment Utilized During Treatment: Gait belt Activity Tolerance: Patient tolerated treatment well Patient left: with call bell/phone within reach;in chair;with chair alarm set Nurse Communication: Mobility status PT Visit Diagnosis: Other abnormalities of gait and mobility (R26.89);Ataxic gait (R26.0)     Time: 9628-3662 PT Time Calculation (min) (ACUTE ONLY): 20 min  Charges:  $Neuromuscular Re-education: 8-22 mins       Ramond Dial 08/03/2021, 5:50 PM  Mee Hives, PT PhD Acute Rehab Dept. Number: [place]

## 2021-08-03 NOTE — Evaluation (Signed)
Speech Language Pathology Evaluation Patient Details Name: [name] MRN: [id number] DOB: June 11, 1961 Today's Date: 08/03/2021 Time: 7253-6644 SLP Time Calculation (min) (ACUTE ONLY): 24 min  Problem List:  Patient Active Problem List   Diagnosis Date Noted   Cerebellar infarction ([facility]) 07/30/2021   Cerebral edema ([place]) 07/30/2021   Hyperlipidemia 07/30/2021   Prediabetes 07/30/2021   Past Medical History:  Past Medical History:  Diagnosis Date   Blood in stool    Hyperlipidemia    Vaginal fibroids    Past Surgical History: History reviewed. No pertinent surgical history. HPI:  61 yo female presenting to ED on 1/14 with nausea and vomiting when standing up. 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 1/16 CTH shows worsening cerebellar edema with potential petechial hemorrhage; worsening mass effect in right posterior fossa and new right-to-left midline shift. PMH including hyperlipidemia.   Assessment / Plan / Recommendation Clinical Impression  Pt participated in speech/language/cognition evaluation. 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 Pt expressed that her job is cognitively demanding, and that she is therefore currently concerned about her perceived slower processing speed. The [facility] Mental Status Examination was completed to evaluate the pt's cognitive-linguistic skills. She achieved a score of 16/30 which is below the normal limits of 27 or more out of 30. She exhibited difficulty in the areas of memory, complex problem solving, and executive function. Pt benefited from additional processing time to improve accuracy during complex tasks. Motor speech and language skills were WFL during assessment; pt reported  occasional word retrieval difficulty, but this was not observed during the evaluation. Skilled SLP services are clinically indicated at this time to cognitive-linguistic function.    SLP Assessment  SLP Recommendation/Assessment: Patient needs continued Speech [place] [facility] SLP Visit Diagnosis: Cognitive communication deficit (R41.841)    Recommendations for follow up therapy are one component of a multi-disciplinary discharge planning process, led by the attending physician.  Recommendations may be updated based on patient status, additional functional criteria and insurance authorization.    Follow Up Recommendations  Acute inpatient rehab (3hours/day)    Assistance Recommended at Discharge     Functional Status Assessment Patient has had a recent decline in their functional status and demonstrates the ability to make significant improvements in function in a reasonable and predictable amount of time.  Frequency and Duration min 2x/week  2 weeks      SLP Evaluation Cognition  Overall Cognitive Status: Impaired/Different from baseline Arousal/Alertness: Awake/alert Orientation Level: Oriented X4 Year: 2023 Month: January Day of Week: Correct Attention: Focused;Sustained Focused Attention: Appears intact Sustained Attention: Appears intact Memory: Impaired Memory Impairment: Retrieval deficit;Decreased short term memory (Immediate: 5/5; delayed: 3/5; with cues: 0/2) Awareness: Appears intact Problem Solving: Impaired Problem Solving Impairment: Verbal complex (Money management: 1/3) Executive Function: Organizing;Sequencing Sequencing: Impaired Sequencing Impairment: Verbal complex (clock drawing: 2/4) Organizing: Impaired Organizing Impairment: Verbal complex (Backward digit span: 2/2 with additional processing time.)       Comprehension  Auditory Comprehension Overall Auditory Comprehension: Appears within functional limits for tasks assessed Yes/No  Questions: Within Functional Limits Commands: Within Functional Limits Conversation: Complex Interfering Components: Working memory Reading Comprehension Reading Status: Within funtional limits    Expression Expression Primary Mode of Expression: Verbal Verbal Expression Overall Verbal Expression:  Appears within functional limits for tasks assessed Initiation: No impairment Level of Generative/Spontaneous Verbalization: Conversation Repetition: No impairment Naming: Impairment Divergent:  (8 items in 1 minute) Pragmatics: No impairment   Oral / Motor  Oral Motor/Sensory Function Overall Oral Motor/Sensory Function: Within functional limits Motor Speech Overall Motor Speech: Appears within functional limits for tasks assessed Respiration: Within functional limits Phonation: Normal Resonance: Within functional limits Articulation: Within functional limitis Intelligibility: Intelligible Motor Planning: Witnin functional limits Motor Speech Errors: Not applicable           Erica Richwine I. Hardin Negus, [place], [place] Office number [phone] Pager [place] 08/03/2021, 10:12 AM

## 2021-08-03 NOTE — Discharge Instructions (Addendum)
Inpatient Rehab Discharge Instructions  [name] Discharge date and time: No discharge date for patient encounter.   Activities/Precautions/ Functional Status: Activity: activity as tolerated Diet: diabetic diet Wound Care: none needed Functional status:  ___ No restrictions     ___ Walk up steps independently ___ 24/7 supervision/assistance   ___ Walk up steps with assistance ___ Intermittent supervision/assistance  ___ Bathe/dress independently ___ Walk with walker     _x__ Bathe/dress with assistance ___ Walk Independently    ___ Shower independently ___ Walk with assistance    ___ Shower with assistance ___ No alcohol     ___ Return to work/school ________  Special Instructions: No driving smoking or alcohol        COMMUNITY REFERRALS UPON DISCHARGE:    Outpatient: PT  OT  SP             Agency:[facility] [address] [place] [address]   [phone]              Appointment Date/Time: WILL CALL PATIENT TO SET UP FOLLOW U APPOINTMENTS  Medical Equipment/Items Ordered:ROLLING WALKER & TUB BENCH                                                 Agency/Supplier:[facility]  [phone]    STROKE/TIA DISCHARGE INSTRUCTIONS SMOKING Cigarette smoking nearly doubles your risk of having a stroke & is the single most alterable risk factor  If you smoke or have smoked in the last 12 months, you are advised to quit smoking for your health. Most of the excess cardiovascular risk related to smoking disappears within a year of stopping. Ask you doctor about anti-smoking medications [facility] Quit Line: 1-800-QUIT NOW Free Smoking Cessation Classes [phone]  CHOLESTEROL Know your levels; limit fat & cholesterol in your diet  Lipid Panel     Component Value Date/Time   CHOL 215 (H) 07/29/2021 1048   TRIG 76 07/29/2021 1048   HDL 39 (L) 07/29/2021 1048   CHOLHDL 5.5 07/29/2021 1048   VLDL 15 07/29/2021 1048   LDLCALC 161 (H) 07/29/2021 1048      Many patients benefit from treatment even if their cholesterol is at goal. Goal: Total Cholesterol (CHOL) less than 160 Goal:  Triglycerides (TRIG) less than 150 Goal:  HDL greater than 40 Goal:  LDL (LDLCALC) less than 100   BLOOD PRESSURE American Stroke Association blood pressure target is less that 120/80 mm/Hg  Your discharge blood pressure is:    Monitor your blood pressure Limit your salt and alcohol intake Many individuals will require more than one medication for high blood pressure  DIABETES (A1c is a blood sugar average for last 3 months) Goal HGBA1c is under 7% (HBGA1c is blood sugar average for last 3 months)  Diabetes:    Lab Results  Component Value Date   HGBA1C 6.1 (H) 07/29/2021    Your HGBA1c can be lowered with medications, healthy diet, and exercise. Check your blood sugar as directed by your physician Call your physician if you experience unexplained or low blood sugars.  PHYSICAL ACTIVITY/REHABILITATION Goal is 30 minutes at least 4 days per week  Activity: No restrictions. Therapies: Physical Therapy: Home Health Return to work:  Activity decreases your risk of heart attack and stroke and makes your heart stronger.  It helps control  your weight and blood pressure; helps you relax and can improve your mood. Participate in a regular exercise program. Talk with your doctor about the best form of exercise for you (dancing, walking, swimming, cycling).  DIET/WEIGHT Goal is to maintain a healthy weight  Your discharge diet is:  Diet Order     None       liquids Your height is:    Your current weight is:   Your Body Mass Index (BMI) is:    Following the type of diet specifically designed for you will help prevent another stroke. Your goal weight range is:   Your goal Body Mass Index (BMI) is 19-24. Healthy food habits can help reduce 3 risk factors for stroke:  High cholesterol, hypertension, and excess weight.  RESOURCES Stroke/Support Group:  Call  [phone]   STROKE EDUCATION PROVIDED/REVIEWED AND GIVEN TO PATIENT Stroke warning signs and symptoms How to activate emergency medical system (call 911). Medications prescribed at discharge. Need for follow-up after discharge. Personal risk factors for stroke. Pneumonia vaccine given: No Flu vaccine given: No My questions have been answered, the writing is legible, and I understand these instructions.  I will adhere to these goals & educational materials that have been provided to me after my discharge from the hospital.       My questions have been answered and I understand these instructions. I will adhere to these goals and the provided educational materials after my discharge from the hospital.  Patient/Caregiver Signature _______________________________ Date __________  Clinician Signature _______________________________________ Date __________  Please bring this form and your medication list with you to all your follow-up doctor's appointments.

## 2021-08-03 NOTE — H&P (Signed)
Physical Medicine and Rehabilitation Admission H&P        Chief Complaint  Patient presents with   Emesis   CVA  : HPI: [name]. [name] is a 61 year old right-handed female with history of hyperlipidemia as well as prediabetes, tobacco use.  Patient on no prescription medications.  Per chart review patient lives with her mother.  1 level apartment.  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  Presented 07/28/2021 with dizziness, unsteady gait and bouts of vomiting.  She had initially been seen at the urgent care 07/28/2021 for headache nausea vomiting and low-grade fever without relief with BC powder.  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  She was initially placed on hypertonic saline.  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  55% narrowing at the left vertebral origin.  Patient did not receive tPA.  Echocardiogram with ejection fraction of 55 to 60% no wall motion abnormalities.  Admission chemistries unremarkable except glucose 125, troponin negative, hemoglobin A1c 6.1, urinalysis negative nitrite.  Neurology follow-up currently maintained on low-dose aspirin for CVA prophylaxis.  Latest follow-up cranial CT scan 08/01/2021 stable no hemorrhagic transformation no new intracranial abnormality.  Tolerating a regular diet.  Therapy evaluations completed due to patient decreased functional mobility was admitted for a comprehensive rehab program.   Review of Systems  Constitutional:  Positive for fever. Negative for malaise/fatigue.  HENT:  Negative for hearing loss.   Eyes:  Negative for blurred vision and double vision.  Respiratory:  Negative for cough and shortness of breath.   Cardiovascular:  Negative for chest pain, palpitations and leg swelling.  Gastrointestinal:  Positive for nausea and vomiting.  Genitourinary:  Negative for dysuria, flank pain  and hematuria.  Skin:  Negative for rash.  Neurological:  Positive for dizziness and headaches.  All other systems reviewed and are negative.     Past Medical History:  Diagnosis Date   Blood in stool     Hyperlipidemia     Vaginal fibroids      History reviewed. No pertinent surgical history.      Family History  Problem Relation Age of Onset   Diabetes Father     Hypertension Father     Heart disease Father     Hyperlipidemia Father     Stroke Father     Cancer Neg Hx      Social History:  reports that she has quit smoking. She does not have any smokeless tobacco history on file. She reports that she does not drink alcohol and does not use drugs. Allergies:      Allergies  Allergen Reactions   Amoxicillin-Pot Clavulanate Itching and Rash          Medications Prior to Admission  Medication Sig Dispense Refill   Aspirin-Acetaminophen-Caffeine (GOODY HEADACHE PO) Take 1 Dose by mouth every 6 (six) hours as needed (pain, headache).       ondansetron (ZOFRAN-ODT) 4 MG disintegrating tablet Take 1 tablet (4 mg total) by mouth every 8 (eight) hours as needed for nausea or vomiting. (Patient not taking: Reported on 07/29/2021) 20 tablet 0      Drug Regimen Review Drug regimen was reviewed and remains appropriate with no significant issues identified   Home: Home Living Family/patient expects to be discharged to:: Private residence Living Arrangements: Parent (Mother) Available Help at Discharge: Family, Available 24 hours/day  Type of Home: Apartment Home Access: Level entry Home Layout: One level Bathroom Shower/Tub: Chiropodist: Standard Home Equipment: None 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   Functional History: Prior Function Prior Level of Function : Independent/Modified 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   Functional Status:  Mobility: Bed Mobility Overal bed mobility: Needs  Assistance Bed Mobility: Supine to Sit Supine to sit: Min guard, HOB elevated Sit to supine: Min assist, HOB elevated, +2 for safety/equipment General bed mobility comments: up with RN Transfers Overall transfer level: Needs assistance Equipment used: None Transfers: Sit to/from Stand Sit to Stand: Min assist General transfer comment: light steadying assist when rising without UE support Ambulation/Gait Ambulation/Gait assistance: Min guard, Min assist Gait Distance (Feet): 150 Feet (+75 - seated rest break needed) Assistive device: Rolling walker (2 wheels), 1 person hand held assist Gait Pattern/deviations: Step-through pattern, Decreased stride length, Narrow base of support General Gait Details: close guard when ambulatory with RW, min assist to steady with HHA only and pt very apprehensive, tolerated 35 ft ambulation without AD. Cues for widening BOS, upright posture, looking forward as opposed to down Gait velocity: decr   ADL: ADL Overall ADL's : Needs assistance/impaired Eating/Feeding: Minimal assistance, Bed level Grooming: Wash/dry face, Oral care, Standing, Min guard Grooming Details (indicate cue type and reason): min guard for safety d/t reports of intermittent dizziness Upper Body Bathing: Moderate assistance, Sitting Lower Body Bathing: Maximal assistance, Bed level, Sitting/lateral leans Upper Body Dressing : Moderate assistance, Sitting Lower Body Dressing: Maximal assistance, Sitting/lateral leans, Bed level Toilet Transfer: Min guard, Ambulation, Rolling walker (2 wheels) Toilet Transfer Details (indicate cue type and reason): simulated via functional mobility, MIN verbal cues for rw mgmt Functional mobility during ADLs: Min guard, Rolling walker (2 wheels) General ADL Comments: pt continues to present with dizziness, impaired balance and generalized deconditioning however dizziness greatly improved from previous therapy session   Cognition: Cognition Overall  Cognitive Status: Within Functional Limits for tasks assessed Orientation Level: Oriented X4 Cognition Arousal/Alertness: Awake/alert Behavior During Therapy: Flat affect, WFL for tasks assessed/performed Overall Cognitive Status: Within Functional Limits for tasks assessed Area of Impairment: Attention, Following commands, Safety/judgement, Problem solving Current Attention Level: Selective Following Commands: Follows one step commands with increased time Safety/Judgement: Decreased awareness of deficits Problem Solving: Slow processing, Decreased initiation, Difficulty sequencing, Requires verbal cues, Requires tactile cues General Comments: AxOx4, following all commands, initially very flat and emotional but conversated more as session progressed, pt asking appropriate questions about rehab and POC   Physical Exam: Blood pressure 135/69, pulse (!) 56, temperature 97.9 F (36.6 C), temperature source Oral, resp. rate 16, height 5\' 3"  (1.6 m), weight 69.3 kg, last menstrual period 07/15/2009, SpO2 100 %. Physical Exam Constitutional:      Appearance: Normal appearance.  HENT:     Head: Normocephalic and atraumatic.     Right Ear: External ear normal.     Left Ear: External ear normal.     Nose: Nose normal.     Mouth/Throat:     Mouth: Mucous membranes are moist.     Pharynx: Oropharynx is clear.  Eyes:     Extraocular Movements: Extraocular movements intact.     Conjunctiva/sclera: Conjunctivae normal.     Pupils: Pupils are equal, round, and reactive to light.  Cardiovascular:     Rate and Rhythm: Normal rate and regular rhythm.     Heart sounds: No murmur heard.   No gallop.  Pulmonary:  Effort: Pulmonary effort is normal. No respiratory distress.     Breath sounds: Normal breath sounds. No wheezing.  Abdominal:     General: Abdomen is flat. There is no distension.     Palpations: Abdomen is soft.     Tenderness: There is no abdominal tenderness.  Musculoskeletal:         General: No swelling. Normal range of motion.     Cervical back: Normal range of motion.     Comments: Mild tenderness over the dorsolateral aspect of left metatarsals  Skin:    General: Skin is warm and dry.     Comments: Long pink finger nails  Neurological:     Mental Status: She is alert.     Comments: Patient is alert.  No acute distress.  Makes eye contact with examiner.  Follows commands.  Provides name and age.  Fair insight and awareness. Alert and oriented x 3. Normal insight and awareness. Intact Memory. Normal language and speech. Cranial nerve exam unremarkable. Good sitting balance. Strength 4-5/5 in all 4's. Sensation intact to LT and pain in all 4's. Decreased FTN, FMC RUE>LUE. DTR's 2+  Psychiatric:        Mood and Affect: Mood normal.        Behavior: Behavior normal.      Lab Results Last 48 Hours        Results for orders placed or performed during the hospital encounter of 07/28/21 (from the past 48 hour(s))  Glucose, capillary     Status: None    Collection Time: 08/02/21  8:30 AM  Result Value Ref Range    Glucose-Capillary 81 70 - 99 mg/dL      Comment: Glucose reference range applies only to samples taken after fasting for at least 8 hours.       Imaging Results (Last 48 hours)  CT HEAD WO CONTRAST (5MM)   Result Date: 08/01/2021 CLINICAL DATA:  61 year old female with right cerebellar, left PCA territory infarcts. EXAM: CT HEAD WITHOUT CONTRAST TECHNIQUE: Contiguous axial images were obtained from the base of the skull through the vertex without intravenous contrast. RADIATION DOSE REDUCTION: This exam was performed according to the departmental dose-optimization program which includes automated exposure control, adjustment of the mA and/or kV according to patient size and/or use of iterative reconstruction technique. COMPARISON:  Head CT 07/30/2021 and earlier. FINDINGS: Brain: Cytotoxic edema in the right cerebellum is stable with mild mass effect on the  4th ventricle. Basilar cisterns remain patent. No tonsillar herniation. Stable cytotoxic edema in the inferior left occipital lobe and left occipital pole. No regional mass effect. Scattered additional bilateral white matter hypodensity is stable. No midline shift, ventriculomegaly, mass effect, evidence of mass lesion, intracranial hemorrhage or new cortically based acute infarction. Vascular: No suspicious intracranial vascular hyperdensity. Skull: Negative. Sinuses/Orbits: Visualized paranasal sinuses and mastoids are stable and well aerated. Other: Visualized orbits and scalp soft tissues are within normal limits. IMPRESSION: 1. 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 Stable mild mass effect on the 4th ventricle. No hemorrhagic transformation or other complicating features. 2. No new intracranial abnormality. Electronically Signed   By: Genevie Ann M.D.   On: 08/01/2021 10:48             Medical Problem List and Plan: 1. 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  Antithrombotics: -DVT/anticoagulation:  Mechanical:  Antiembolism stockings, knee (TED hose) Bilateral lower extremities             -antiplatelet therapy: Aspirin 81 mg daily 3. Pain Management: Tylenol as needed 4. Mood: Provide emotional support             -antipsychotic agents: N/A 5. Neuropsych: This patient is capable of making decisions on her own behalf. 6. Skin/Wound Care: Routine skin checks 7. Fluids/Electrolytes/Nutrition: Routine in and outs with follow-up chemistries 8.  Hyperlipidemia.  Crestor 9.  Prediabetes.  Hemoglobin A1c 6.1.  SSI discontinued.           Lavon Paganini Angiulli, PA-C 08/03/2021   I have personally performed a face to face diagnostic evaluation of this patient and  formulated the key components of the plan.  Additionally, I have personally reviewed laboratory data, imaging studies, as well as relevant notes and concur with the physician assistant's documentation above.  The patient's status has not changed from the original H&P.  Any changes in documentation from the acute care chart have been noted above.  Meredith Staggers, MD, [facility]rown

## 2021-08-03 NOTE — Progress Notes (Signed)
Patient to admit to CIR, room 4W 07. Report called to [facility] RN at this time. Patient made aware of pending transfer/discharge to CIR at this time.

## 2021-08-03 NOTE — Progress Notes (Signed)
Inpatient Rehab Admissions Coordinator:    I have insurance approval and a bed available for pt to admit to CIR today. Dr. Leonie Man in agreement.  Will let pt/family and TOC team know.   Shann Medal, PT, DPT Admissions Coordinator [phone] 08/03/21  10:31 AM

## 2021-08-03 NOTE — Progress Notes (Addendum)
PMR Admission Coordinator Pre-Admission Assessment   Patient: [name] is an 61 y.o., female MRN: [id number] DOB: 1960/11/30 Height: _0  (160 cm) Weight: 69.3 kg   Insurance Information HMO:     PPO:      PCP:      IPA:      80/20:      OTHER:  PRIMARY: [employer]      Policy#: [id number]      Subscriber: pt CM Name: Jackelyn Poling      Phone#: [phone]     Fax#: [phone] Pre-Cert#: [id number] auth for CIR from [place] with [facility] with updates due to Levora Angel at fax listed above. Concurrent review date 1/25.      Employer:  Benefits:  Phone #: [phone]     Name:  Eff. Date: 07-15-21     Deduct: $1500 ($16.24 met)      Out of Pocket Max: $3500 (met $16.24 met)      Life Max: n/a CIR: 80%      SNF: 80% Outpatient: 80%     Co-Ins: 20% Home Health: 80%      Co-Ins: 20% DME: 80%     Co-Ins: 20% Providers:  SECONDARY:       Policy#:      Phone#:    Development worker, community:       Phone#:    The Actuary for patients in Inpatient Rehabilitation Facilities with attached Privacy Act [place] Records was provided and verbally reviewed with: N/A   Emergency Contact Information Contact Information       Name Relation Home Work Mobile    Tripodi,Peggy Mother [phone]               Current Medical History  Patient Admitting Diagnosis: CVA   History of Present Illness: [name]. [place] is a 61 year old right-handed female with history of hyperlipidemia as well as prediabetes, tobacco use.   Presented 07/28/2021 with dizziness, unsteady gait and bouts of vomiting.  She had initially been seen at the urgent care 07/28/2021 for headache nausea vomiting and low-grade fever without relief with BC powder.  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  She was initially placed on hypertonic saline.  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  55% narrowing at the left vertebral origin.  Patient did not receive tPA.  Echocardiogram with ejection fraction of 55 to 60% no wall motion abnormalities.  Admission chemistries unremarkable except glucose 125, troponin negative, hemoglobin A1c 6.1, urinalysis negative nitrite.  Neurology follow-up currently maintained on low-dose aspirin for CVA prophylaxis.  Latest follow-up cranial CT scan 08/01/2021 stable no hemorrhagic transformation no new intracranial abnormality.  Tolerating a regular diet.  Therapy evaluations completed due to patient decreased functional mobility was recommended  for a comprehensive rehab program.   Complete NIHSS TOTAL: 0   Patient's medical record from [name] has been reviewed by the rehabilitation admission coordinator and physician.   Past Medical History      Past Medical History:  Diagnosis Date   Blood in stool     Hyperlipidemia     Vaginal fibroids        Has the patient had major surgery during 100 days prior to admission? No   Family History   family history includes Diabetes in her father; Heart disease in her father; Hyperlipidemia in her father; Hypertension  in her father; Stroke in her father.   Current Medications   Current Facility-Administered Medications:    acetaminophen (TYLENOL) tablet 650 mg, 650 mg, Oral, Q6H PRN, 650 mg at 08/01/21 2008 **OR** acetaminophen (TYLENOL) suppository 650 mg, 650 mg, Rectal, Q6H PRN, Gaylan Gerold, DO   aspirin EC tablet 81 mg, 81 mg, Oral, Daily, Leonie Man, Pramod S, MD, 81 mg at 08/02/21 0912   Chlorhexidine Gluconate Cloth 2 % PADS 6 each, 6 each, Topical, Daily, Etheleen Nicks, MD, 6 each at 08/02/21 0912   mupirocin ointment (BACTROBAN) 2 % 1 application, 1 application, Nasal, BID, Amie Portland, MD, 1 application at 28/31/51 2115   polyethylene glycol (MIRALAX / GLYCOLAX) packet 17 g, 17 g, Oral, Daily PRN, Gaylan Gerold, DO, 17 g at 08/02/21 0953   rosuvastatin (CRESTOR) tablet 20 mg, 20  mg, Oral, Daily, Gaylan Gerold, DO, 20 mg at 08/02/21 0912   senna-docusate (Senokot-S) tablet 1 tablet, 1 tablet, Oral, BID, Janine Ores, NP, 1 tablet at 08/02/21 2115   Patients Current Diet:  Diet Order                  Diet regular Room service appropriate? Yes with Assist; Fluid consistency: Thin  Diet effective now                         Precautions / Restrictions Precautions Precautions: Fall Precaution Comments: Per RN, MD does not want pt mobilizing OOB today, eval EOB only. Restrictions Weight Bearing Restrictions: No    Has the patient had 2 or more falls or a fall with injury in the past year? Yes   Prior Activity Level Community (5-7x/wk): fully independent, working as a IT sales professional, driving, no DME used.   Prior Functional Level Self Care: Did the patient need help bathing, dressing, using the toilet or eating? Independent   Indoor Mobility: Did the patient need assistance with walking from room to room (with or without device)? Independent   Stairs: Did the patient need assistance with internal or external stairs (with or without device)? Independent   Functional Cognition: Did the patient need help planning regular tasks such as shopping or remembering to take medications? Independent   Patient Information Are you of Hispanic, Latino/a,or Spanish origin?: A. No, not of Hispanic, Latino/a, or Spanish origin What is your race?: B. Black or African American Do you need or want an interpreter to communicate with a doctor or health care staff?: 0. No   Patient's Response To:  Health Literacy and Transportation Is the patient able to respond to health literacy and transportation needs?: Yes Health Literacy - How often do you need to have someone help you when you read instructions, pamphlets, or other written material from your doctor or pharmacy?: Never In the past 12 months, has lack of transportation kept you from medical  appointments or from getting medications?: Yes In the past 12 months, has lack of transportation kept you from meetings, work, or from getting things needed for daily living?: Yes   Home Assistive Devices / Isabel Devices/Equipment: Eyeglasses Home Equipment: None   Prior Device Use: Indicate devices/aids used by the patient prior to current illness, exacerbation or injury? None of the above   Current Functional Level Cognition   Overall Cognitive Status: Within Functional Limits for tasks assessed Current Attention Level: Selective Orientation Level: Oriented X4 Following Commands: Follows one step commands with increased time Safety/Judgement: Decreased awareness of deficits General  Comments: AxOx4, following all commands, initially very flat and emotional but conversated more as session progressed, pt asking appropriate questions about rehab and POC    Extremity Assessment (includes Sensation/Coordination)   Upper Extremity Assessment: Generalized weakness, Overall WFL for tasks assessed (overall WFL; 4/5 BUEs, using BUEs during ADLS with no difficulties, opening items for ADLs)  Lower Extremity Assessment: Defer to PT evaluation     ADLs   Overall ADL's : Needs assistance/impaired Eating/Feeding: Minimal assistance, Bed level Grooming: Wash/dry face, Oral care, Standing, Min guard Grooming Details (indicate cue type and reason): min guard for safety d/t reports of intermittent dizziness Upper Body Bathing: Moderate assistance, Sitting Lower Body Bathing: Maximal assistance, Bed level, Sitting/lateral leans Upper Body Dressing : Moderate assistance, Sitting Lower Body Dressing: Maximal assistance, Sitting/lateral leans, Bed level Toilet Transfer: Min guard, Ambulation, Rolling walker (2 wheels) Toilet Transfer Details (indicate cue type and reason): simulated via functional mobility, MIN verbal cues for rw mgmt Functional mobility during ADLs: Min guard, Rolling  walker (2 wheels) General ADL Comments: pt continues to present with dizziness, impaired balance and generalized deconditioning however dizziness greatly improved from previous therapy session     Mobility   Overal bed mobility: Needs Assistance Bed Mobility: Supine to Sit Supine to sit: Min guard, HOB elevated Sit to supine: Min assist, HOB elevated, +2 for safety/equipment General bed mobility comments: up with RN     Transfers   Overall transfer level: Needs assistance Equipment used: None Transfers: Sit to/from Stand Sit to Stand: Min assist General transfer comment: light steadying assist when rising without UE support     Ambulation / Gait / Stairs / Emergency planning/management officer   Ambulation/Gait Ambulation/Gait assistance: Min guard, Min assist Gait Distance (Feet): 150 Feet (+75 - seated rest break needed) Assistive device: Rolling walker (2 wheels), 1 person hand held assist Gait Pattern/deviations: Step-through pattern, Decreased stride length, Narrow base of support General Gait Details: close guard when ambulatory with RW, min assist to steady with HHA only and pt very apprehensive, tolerated 35 ft ambulation without AD. Cues for widening BOS, upright posture, looking forward as opposed to down Gait velocity: decr     Posture / Balance Dynamic Sitting Balance Sitting balance - Comments: sitting at edge of recliner to assess strength with no UE support or LOB Balance Overall balance assessment: Needs assistance Sitting-balance support: No upper extremity supported, Feet supported Sitting balance-Leahy Scale: Fair Sitting balance - Comments: sitting at edge of recliner to assess strength with no UE support or LOB Standing balance support: Single extremity supported, During functional activity Standing balance-Leahy Scale: Poor Standing balance comment: at least one UE supported during ADLS at sink     Special needs/care consideration N/a    Previous Home Environment (from  acute therapy documentation) Living Arrangements: Parent (Mother) Available Help at Discharge: Family, Available 24 hours/day Type of Home: Apartment Home Layout: One level Home Access: Level entry Bathroom Shower/Tub: Chiropodist: Standard Home Care Services: No 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   Discharge Living Setting Plans for Discharge Living Setting: Patient's home, Lives with (comment) (mother) Type of Home at Discharge: Apartment Discharge Home Layout: One level Discharge Home Access: Level entry Discharge Bathroom Shower/Tub: Tub/shower unit Discharge Bathroom Toilet: Standard Discharge Bathroom Accessibility: Yes How Accessible: Accessible via walker Does the patient have any problems obtaining your medications?: No   Social/Family/Support Systems Anticipated Caregiver: Keir Foland (mom) Anticipated Caregiver's Contact Information: [phone] Ability/Limitations of Caregiver: supervision ONLY Caregiver Availability:  24/7 Discharge Plan Discussed with Primary Caregiver: Yes Is Caregiver In Agreement with Plan?: Yes Does Caregiver/Family have Issues with Lodging/Transportation while Pt is in Rehab?: Yes (doesn't drive)   Goals Patient/Family Goal for Rehab: PT/OT mod I, SLP mod I Expected length of stay: 10-12 days Pt/Family Agrees to Admission and willing to participate: Yes Program Orientation Provided & Reviewed with Pt/Caregiver Including Roles  & Responsibilities: Yes  Barriers to Discharge: Decreased caregiver support   Decrease burden of Care through IP rehab admission: n/a   Possible need for SNF placement upon discharge: n/a   Patient Condition: I have reviewed medical records from [facility], spoken with CM, and patient and family member. I met with patient at the bedside and discussed via phone for inpatient rehabilitation assessment.  Patient will benefit from ongoing PT, OT, and SLP, can actively participate in  3 hours of therapy a day 5 days of the week, and can make measurable gains during the admission.  Patient will also benefit from the coordinated team approach during an Inpatient Acute Rehabilitation admission.  The patient will receive intensive therapy as well as Rehabilitation physician, nursing, social worker, and care management interventions.  Due to safety, skin/wound care, disease management, medication administration, pain management, and patient education the patient requires 24 hour a day rehabilitation nursing.  The patient is currently min assist with mobility and basic ADLs.  Discharge setting and therapy post discharge at home with home health is anticipated.  Patient has agreed to participate in the Acute Inpatient Rehabilitation Program and will admit today.   Preadmission Screen Completed By:  Michel Santee, PT, DPT 08/03/2021 9:36 AM ______________________________________________________________________   Discussed status with Dr. Naaman Plummer on 08/03/21  at 9:37 AM  and received approval for admission today.   Admission Coordinator:  Michel Santee, PT,DPT time 9:37 AM Sudie Grumbling 08/03/21     Assessment/Plan: Diagnosis: posterior circulation CVA Does the need for close, 24 hr/day Medical supervision in concert with the patient's rehab needs make it unreasonable for this patient to be served in a less intensive setting? Yes Co-Morbidities requiring supervision/potential complications: lipids, post-stroke sequelae Due to bladder management, bowel management, safety, skin/wound care, disease management, medication administration, pain management, and patient education, does the patient require 24 hr/day rehab nursing? Yes Does the patient require coordinated care of a physician, rehab nurse, PT, OT, and SLP to address physical and functional deficits in the context of the above medical diagnosis(es)? Yes Addressing deficits in the following areas: balance, endurance, locomotion, strength,  transferring, bowel/bladder control, bathing, dressing, feeding, grooming, toileting, cognition, and psychosocial support Can the patient actively participate in an intensive therapy program of at least 3 hrs of therapy 5 days a week? Yes The potential for patient to make measurable gains while on inpatient rehab is excellent Anticipated functional outcomes upon discharge from inpatient rehab: modified independent PT, modified independent OT, modified independent SLP Estimated rehab length of stay to reach the above functional goals is: 10-12 days Anticipated discharge destination: Home 10. Overall Rehab/Functional Prognosis: excellent     MD Signature: Meredith Staggers, MD, [place] Director Rehabilitation Services

## 2021-08-04 DIAGNOSIS — I63532 Cerebral infarction due to unspecified occlusion or stenosis of left posterior cerebral artery: Secondary | ICD-10-CM | POA: Diagnosis not present

## 2021-08-04 LAB — GLUCOSE, CAPILLARY: Glucose-Capillary: 118 mg/dL — ABNORMAL HIGH (ref 70–99)

## 2021-08-04 NOTE — Progress Notes (Signed)
PROGRESS NOTE   Subjective/Complaints: Tolerated OT well today In gym, spoke with her co-worker in room who is visiting.  Nursing reported urinary frequency and urgency present prior to admission  ROS: +urinary frequency and urgency since prior to admission   Objective:   No results found. No results for input(s): WBC, HGB, HCT, PLT in the last 72 hours. No results for input(s): NA, K, CL, CO2, GLUCOSE, BUN, CREATININE, CALCIUM in the last 72 hours.  Intake/Output Summary (Last 24 hours) at 08/04/2021 2117 Last data filed at 08/04/2021 1842 Gross per 24 hour  Intake 918 ml  Output --  Net 918 ml        Physical Exam: Vital Signs Blood pressure 118/62, pulse (!) 59, temperature 98.2 F (36.8 C), temperature source Oral, resp. rate 18, height 5\' 3"  (1.6 m), weight 69.2 kg, last menstrual period 07/15/2009, SpO2 99 %. Gen: no distress, normal appearing HEENT: oral mucosa pink and moist, NCAT Cardio: Bradycardic Chest: normal effort, normal rate of breathing Abd: soft, non-distended Ext: no edema Psych: pleasant, normal affect Musculoskeletal:        General: No swelling. Normal range of motion.     Cervical back: Normal range of motion.     Comments: Mild tenderness over the dorsolateral aspect of left metatarsals  Skin:    General: Skin is warm and dry.     Comments: Long pink finger nails  Neurological:     Mental Status: She is alert.     Comments: Patient is alert.  No acute distress.  Makes eye contact with examiner.  Follows commands.  Provides name and age.  Fair insight and awareness. Alert and oriented x 3. Normal insight and awareness. Intact Memory. Normal language and speech. Cranial nerve exam unremarkable. Good sitting balance. Strength 4-5/5 in all 4's. Sensation intact to LT and pain in all 4's. Decreased FTN, FMC RUE>LUE. DTR's 2+  Psychiatric:        Mood and Affect: Mood normal.        Behavior:  Behavior normal.    Assessment/Plan: 1. Functional deficits which require 3+ hours per day of interdisciplinary therapy in a comprehensive inpatient rehab setting. Physiatrist is providing close team supervision and 24 hour management of active medical problems listed below. Physiatrist and rehab team continue to assess barriers to discharge/monitor patient progress toward functional and medical goals  Care Tool:  Bathing    Body parts bathed by patient: Right arm, Left arm, Chest, Abdomen, Front perineal area, Buttocks, Right upper leg, Left upper leg, Right lower leg, Left lower leg, Face         Bathing assist Assist Level: Minimal Assistance - Patient > 75%     Upper Body Dressing/Undressing Upper body dressing   What is the patient wearing?: Pull over shirt    Upper body assist Assist Level: Set up assist    Lower Body Dressing/Undressing Lower body dressing      What is the patient wearing?: Pants, Underwear/pull up     Lower body assist Assist for lower body dressing: Minimal Assistance - Patient > 75%     Toileting Toileting    Toileting assist Assist for toileting:  Minimal Assistance - Patient > 75%     Transfers Chair/bed transfer  Transfers assist     Chair/bed transfer assist level: Minimal Assistance - Patient > 75%     Locomotion Ambulation   Ambulation assist      Assist level: Minimal Assistance - Patient > 75% Assistive device: Walker-rolling Max distance: 200 ft   Walk 10 feet activity   Assist     Assist level: Minimal Assistance - Patient > 75% Assistive device: No Device   Walk 50 feet activity   Assist    Assist level: Minimal Assistance - Patient > 75% Assistive device: Walker-rolling    Walk 150 feet activity   Assist    Assist level: Minimal Assistance - Patient > 75% Assistive device: Walker-rolling    Walk 10 feet on uneven surface  activity   Assist Walk 10 feet on uneven surfaces activity did  not occur: Safety/medical concerns         Wheelchair     Assist Is the patient using a wheelchair?: Yes Type of Wheelchair: Manual    Wheelchair assist level: Total Assistance - Patient < 25% Max wheelchair distance: 200 ft    Wheelchair 50 feet with 2 turns activity    Assist        Assist Level: Total Assistance - Patient < 25%   Wheelchair 150 feet activity     Assist      Assist Level: Total Assistance - Patient < 25%   Blood pressure 118/62, pulse (!) 59, temperature 98.2 F (36.8 C), temperature source Oral, resp. rate 18, height 5\' 3"  (1.6 m), weight 69.2 kg, last menstrual period 07/15/2009, SpO2 99 %.  Medical Problem List and Plan: 1. 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  2.  Antithrombotics: -DVT/anticoagulation:  Mechanical:  Antiembolism stockings, knee (TED hose) Bilateral lower extremities             -antiplatelet therapy: Aspirin 81 mg daily 3. Pain Management: Tylenol as needed 4. Mood: Provide emotional support             -antipsychotic agents: N/A 5. Neuropsych: This patient is capable of making decisions on her own behalf. 6. Skin/Wound Care: Routine skin checks 7. Fluids/Electrolytes/Nutrition: Routine in and outs with follow-up chemistries 8.  Hyperlipidemia.  Crestor 9.  Prediabetes.  Hemoglobin A1c 6.1.  SSI discontinued. 10. Overweight: BMI 27.03: provide dietary education.  11. Hypernatremia: repeat Na tomorrow.  12. Bradycardia: continue to monitor TID 13. HLD: continue Crestor 20mg .     LOS: 1 days A FACE TO FACE EVALUATION WAS PERFORMED  [name] 08/04/2021, 9:17 PM

## 2021-08-04 NOTE — Progress Notes (Signed)
Urinary frequency and urgency. Patient reports this was an issue PTA. 1 attempt OOB without assistance. Bed alarm in place. Denies pain. [name] A

## 2021-08-04 NOTE — Evaluation (Signed)
Speech Language Pathology Assessment and Plan  Patient Details  Name: [name] MRN: [id number] Date of Birth: 08/25/1960  SLP Diagnosis: Cognitive Impairments  Rehab Potential: Excellent ELOS: 12-14   Today's Date: 08/04/2021 SLP Individual Time: 3716-9678 SLP Individual Time Calculation (min): 50 min  Hospital Problem: Principal Problem:   Acute ischemic left PCA stroke [facility])  Past Medical History:  Past Medical History:  Diagnosis Date   Blood in stool    Hyperlipidemia    Vaginal fibroids    Past Surgical History: History reviewed. No pertinent surgical history.  Assessment / Plan / Recommendation Clinical Impression   [name] is a 61 year old right-handed female with history of hyperlipidemia as well as prediabetes, tobacco use.  Patient on no prescription medications.  Per chart review patient lives with her mother.  1 level apartment.  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  Presented 07/28/2021 with dizziness, unsteady gait and bouts of vomiting. 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  Latest follow-up cranial CT scan 08/01/2021 stable no hemorrhagic transformation no new intracranial abnormality. Tolerating a regular diet. Pt transferred to CIR 08/03/21.  Pt presents with mild cognitive impairment at this time primarily in areas of memory, problem solving, thought organization and high level attention. Pt benefits from extra time to complete more complex cognitive tasks which she reports is a change for her but also intentional. She would prefer extra time for accuracy vs impulsivity and error which implies good awareness and insight into current cognitive and physical changes s/p CVA. Pt completing basic money management and mathematics tasks with 100% accuracy, 70% accuracy for reading/comprehending instructions. Pt expressive/receptive language judged to be WNL for age and function.   Pt does have a cognitively demanding full time job  and is caregiver for her mother whom lives with her. She has some family in town but would like to return to her work and caregiving roles. Pt did not previously take any medication, would like to understand and manage her current medications independently. Pt will benefit from skilled ST during CIR to increase safety and independence with daily routine for home environment.   Skilled Therapeutic Interventions          Pt participating in portions of the ALFA as well as further non-standardized assessments of cognitive function. Please see above.   SLP Assessment  Patient will need skilled Speech Lanaguage Pathology Services during CIR admission    Recommendations  Recommendations for Other Services: Neuropsych consult Patient destination: Home Follow up Recommendations:  (tbd)    SLP Frequency 3 to 5 out of 7 days   SLP Duration  SLP Intensity  SLP Treatment/Interventions 12-14  Minumum of 1-2 x/day, 30 to 90 minutes  Cognitive remediation/compensation;Patient/family education;Therapeutic Exercise;Therapeutic Activities;Internal/external aids;Cueing hierarchy;Environmental controls    Pain Pain Assessment Pain Scale: 0-10 Pain Score: 0-No pain  Prior Functioning Cognitive/Linguistic Baseline: Within functional limits Type of Home: Apartment  Lives With: Family Available Help at Discharge: Family;Available 24 hours/day Education: HS Vocation: Full time employment  SLP Evaluation Cognition Overall Cognitive Status: Impaired/Different from baseline Arousal/Alertness: Awake/alert Orientation Level: Oriented X4 Year: 2023 Month: January Day of Week: Correct Attention: Focused;Sustained Focused Attention: Appears intact Sustained Attention: Appears intact Memory: Impaired Memory Impairment: Retrieval deficit;Decreased short term memory Decreased Short Term Memory: Verbal complex;Functional complex Awareness: Appears intact Problem Solving: Impaired Problem Solving  Impairment: Verbal complex;Functional complex Organizing: Impaired Organizing Impairment: Functional complex;Verbal complex Safety/Judgment: Appears intact  Comprehension Auditory Comprehension Overall Auditory Comprehension:  Appears within functional limits for tasks assessed Yes/No Questions: Within Functional Limits Commands: Within Functional Limits Conversation: Complex Expression Expression Primary Mode of Expression: Verbal Verbal Expression Overall Verbal Expression: Appears within functional limits for tasks assessed Oral Motor Oral Motor/Sensory Function Overall Oral Motor/Sensory Function: Within functional limits Motor Speech Overall Motor Speech: Appears within functional limits for tasks assessed  Care Tool Care Tool Cognition Ability to hear (with hearing aid or hearing appliances if normally used Ability to hear (with hearing aid or hearing appliances if normally used): 0. Adequate - no difficulty in normal conservation, social interaction, listening to TV   Expression of Ideas and Wants Expression of Ideas and Wants: 4. Without difficulty (complex and basic) - expresses complex messages without difficulty and with speech that is clear and easy to understand   Understanding Verbal and Non-Verbal Content Understanding Verbal and Non-Verbal Content: 4. Understands (complex and basic) - clear comprehension without cues or repetitions  Memory/Recall Ability Memory/Recall Ability : Current season;Location of own room;Staff names and faces;That he or she is in a hospital/hospital unit   Short Term Goals: Week 1: SLP Short Term Goal 1 (Week 1): Pt will complete money/med management tasks with Supervision A verbal cues and extra time as needed SLP Short Term Goal 2 (Week 1): Pt will recall complex concepts/information with use of compensatory memory aid with Supervision A SLP Short Term Goal 3 (Week 1): Pt will complete moderately complex problem solving tasks with  Supervision A and extra time as needed SLP Short Term Goal 4 (Week 1): Pt will participate in alternating attention task with Supervision A cues for accuracy  Refer to Care Plan for Long Term Goals  Recommendations for other services: Neuropsych  Discharge Criteria: Patient will be discharged from SLP if patient refuses treatment 3 consecutive times without medical reason, if treatment goals not met, if there is a change in medical status, if patient makes no progress towards goals or if patient is discharged from hospital.  The above assessment, treatment plan, treatment alternatives and goals were discussed and mutually agreed upon: by patient  Dewaine Conger 08/04/2021, 12:36 PM

## 2021-08-04 NOTE — Evaluation (Signed)
Physical Therapy Assessment and Plan  Patient Details  Name: [name] MRN: [id number] Date of Birth: 07-23-1960  PT Diagnosis: Coordination disorder, Difficulty walking, Dizziness and giddiness, Hemiparesis non-dominant, and Muscle weakness Rehab Potential: Good ELOS: 10 days   Today's Date: 08/04/2021 PT Individual Time: 6237-6283 PT Individual Time Calculation (min): 73 min    Hospital Problem: Principal Problem:   Acute ischemic left PCA stroke [facility])   Past Medical History:  Past Medical History:  Diagnosis Date   Blood in stool    Hyperlipidemia    Vaginal fibroids    Past Surgical History: History reviewed. No pertinent surgical history.  Assessment & Plan Clinical Impression: Patient is a 61 y.o. year old right-handed female with history of hyperlipidemia as well as prediabetes, tobacco use.  Patient on no prescription medications.  Per chart review patient lives with her mother.  1 level apartment.  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  Presented 07/28/2021 with dizziness, unsteady gait and bouts of vomiting.  She had initially been seen at the urgent care 07/28/2021 for headache nausea vomiting and low-grade fever without relief with BC powder.  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  She was initially placed on hypertonic saline.  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  55% narrowing at the left vertebral origin.  Patient did not receive tPA.  Echocardiogram with ejection fraction of 55 to 60% no wall motion abnormalities.  Admission chemistries unremarkable except glucose 125, troponin negative, hemoglobin A1c 6.1, urinalysis negative nitrite.  Neurology follow-up currently maintained on low-dose aspirin for CVA prophylaxis.  Latest follow-up cranial CT scan 08/01/2021 stable no hemorrhagic transformation no new intracranial  abnormality.  Tolerating a regular diet.  Therapy evaluations completed due to patient decreased functional mobility was admitted for a comprehensive rehab program..  Patient transferred to CIR on 08/03/2021 .   Patient currently requires min assist with mobility secondary to muscle weakness, decreased cardiorespiratoy endurance, impaired timing and sequencing, unbalanced muscle activation, and decreased coordination, ,, decreased motor planning, decreased problem solving and decreased memory, and decreased standing balance, decreased postural control, and decreased balance strategies.  Prior to hospitalization, patient was independent  with mobility and lived with Family in an [place].  To access home, From parking space, pt has 6 low height steps (two groups of 3 steps) with one side HR to reach front doorStairs to enter.  Patient will benefit from skilled PT intervention to maximize safe functional mobility, minimize fall risk, and decrease caregiver burden for planned discharge home with 24 hour supervision.  Anticipate patient will benefit from follow up OP at discharge.  PT - End of Session Activity Tolerance: Tolerates 30+ min activity with multiple rests Endurance Deficit: Yes (Simultaneous filing. User may not have seen previous data.) PT Assessment Rehab Potential (ACUTE/IP ONLY): Good PT Barriers to Discharge: Inaccessible home environment;Decreased caregiver support;Home environment access/layout;Lack of/limited family support;Insurance for SNF coverage;Nutrition means PT Patient demonstrates impairments in the following area(s): Balance;Edema;Endurance;Motor;Nutrition;Pain;Perception;Safety PT Transfers Functional Problem(s): Bed Mobility;Bed to Chair;Car;Furniture PT Locomotion Functional Problem(s): Ambulation;Wheelchair Mobility;Stairs PT Plan PT Intensity: Minimum of 1-2 x/day ,45 to 90 minutes PT Frequency: 5 out of 7 days PT Duration Estimated Length of Stay: 10 days PT  Treatment/Interventions: Ambulation/gait training;Community reintegration;DME/adaptive equipment instruction;Neuromuscular re-education;Psychosocial support;Stair training;UE/LE Strength taining/ROM;Wheelchair propulsion/positioning;UE/LE Coordination activities;Therapeutic Activities;Skin care/wound management;Pain management;Functional electrical stimulation;Discharge planning;Balance/vestibular training;Cognitive remediation/compensation;Disease management/prevention;Functional mobility training;Patient/family  education;Splinting/orthotics;Therapeutic Exercise;Visual/perceptual remediation/compensation PT Transfers Anticipated Outcome(s): Supervision/ Mod I PT Locomotion Anticipated Outcome(s): Supervision PT Recommendation Follow Up Recommendations: Home health PT;Outpatient PT;24 hour supervision/assistance Patient destination: Home Equipment Recommended: To be determined   PT Evaluation Precautions/Restrictions Precautions Precautions: Fall Precaution Comments: L coordination deficit, mild trunk ataxia, Bil TED hose Restrictions Weight Bearing Restrictions: No General   Vital SignsTherapy Vitals Temp: 99 F (37.2 C) Temp Source: Oral Pulse Rate: 70 Resp: 18 BP: 119/68 Patient Position (if appropriate): Sitting Oxygen Therapy SpO2: 100 % O2 Device: Room Air Pain Pain Assessment Pain Scale: 0-10 Pain Score: 0-No pain Pain Interference Pain Interference Pain Effect on Sleep: 0. Does not apply - I have not had any pain or hurting in the past 5 days Pain Interference with Therapy Activities: 1. Rarely or not at all Pain Interference with Day-to-Day Activities: 1. Rarely or not at all Home Living/Prior [place] Available Help at Discharge: Family;Available 24 hours/day;Friend(s) Type of Home: Apartment Home Access: Stairs to enter [employer] of Steps: From parking space, pt has 6 low height steps (two groups of 3 steps) with one side HR to reach  front door Entrance Stairs-Rails: Right Home Layout: One level Bathroom Shower/Tub: Chiropodist: Standard 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  Lives With: Family Prior Function Level of Independence: Independent with basic ADLs;Independent with homemaking with ambulation;Independent with gait;Independent with transfers  Able to Take Stairs?: Reciprically Driving: Yes Vocation: Full time employment Vocation Requirements: Librarian, 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 Perception Perception: Within Functional Limits Praxis Praxis: Intact  Cognition Overall Cognitive Status: Impaired/Different from baseline Arousal/Alertness: Awake/alert Orientation Level: Oriented X4 Year: 2023 Month: January Day of Week: Correct Attention: Sustained;Focused;Selective Focused Attention: Appears intact Sustained Attention: Appears intact Selective Attention: Impaired Memory: Impaired Memory Impairment: Retrieval deficit;Decreased short term memory Decreased Short Term Memory: Verbal complex;Functional complex Immediate Memory Recall: Sock;Blue;Bed Memory Recall Sock: Without Cue Memory Recall Blue: Not able to recall Memory Recall Bed: Not able to recall Awareness: Appears intact Problem Solving: Impaired Problem Solving Impairment: Verbal complex;Functional complex Organizing: Impaired Organizing Impairment: Functional complex;Verbal  complex Safety/Judgment: Appears intact Sensation Sensation Light Touch: Appears Intact Hot/Cold: Appears Intact Proprioception: Appears Intact Stereognosis: Appears Intact Coordination Gross Motor Movements are Fluid and Coordinated: Yes Fine Motor Movements are Fluid and Coordinated: Yes Coordination and Movement Description: BUE coordination WFLs for finger to nose testing and functional use during ADL tasks.  Will continue to examine futher in treatment. Heel Shin Test: mild dysmetria and increased time to zero-in on target with LLE; WFL with  RLE Motor  Motor Motor: Ataxia;Other (comment) Motor - Skilled Clinical Observations: Swaying noted in sitting and in standing   Trunk/Postural Assessment  Cervical Assessment Cervical Assessment: Exceptions to [facility] (slight head tilt to the left) Thoracic Assessment Thoracic Assessment: Exceptions to [facility] (slight thoracic rounding) Lumbar Assessment Lumbar Assessment: Within Functional Limits Postural Control Postural Control: Deficits on evaluation  Balance Balance Balance Assessed: Yes Standardized Balance Assessment Standardized Balance Assessment: Berg Balance Test Berg Balance Test Sit to Stand: Able to  stand  independently using hands Standing Unsupported: Able to stand 30 seconds unsupported Sitting with Back Unsupported but Feet Supported on Floor or Stool: Able to sit 2 minutes under supervision Stand to Sit: Uses backs of legs against chair to control descent Transfers: Able to transfer with verbal cueing and /or supervision Standing Unsupported with Eyes Closed: Able to stand 3 seconds Standing Ubsupported with Feet Together: Needs help to attain position but able to stand for 30 seconds with feet together From Standing, Reach Forward with Outstretched Arm: Can reach forward >12 cm safely (5") From Standing Position, Pick up Object from Floor: Able to pick up shoe, needs supervision From Standing Position, Turn to Look  Behind Over each Shoulder: Needs supervision when turning Turn 360 Degrees: Needs close supervision or verbal cueing Standing Unsupported, Alternately Place Feet on Step/Stool: Able to complete >2 steps/needs minimal assist (Completes 8 toe touches with CGA and no AD with extra time for balance.) Standing Unsupported, One Foot in Front: Able to plae foot ahead of the other independently and hold 30 seconds Standing on One Leg: Able to lift leg independently and hold equal to or more than 3 seconds Total Score: 29 Static Sitting Balance Static Sitting - Balance Support: No upper extremity supported;Feet supported Static Sitting - Level of Assistance: 5: Stand by assistance Dynamic Sitting Balance Dynamic Sitting - Balance Support: Feet unsupported;During functional activity Dynamic Sitting - Level of Assistance: 5: Stand by assistance;4: Min assist (CGA) Static Standing Balance Static Standing - Balance Support: No upper extremity supported;During functional activity Static Standing - Level of Assistance: 4: Min assist Dynamic Standing Balance Dynamic Standing - Balance Support: During functional activity Dynamic Standing - Level of Assistance: 3: Mod assist Extremity Assessment  RUE Assessment RUE Assessment: Within Functional Limits General Strength Comments: strength 4/5 throughout LUE Assessment LUE Assessment: Exceptions to [facility] General Strength Comments: strength 3+/5 throughout, pt with recent wrist fx as well per her report. RLE Assessment RLE Assessment: Exceptions to [facility] RLE Strength Right Hip Flexion: 3-/5 Right Hip Extension: 3+/5 Right Hip ABduction: 3+/5 Right Hip ADduction: 4-/5 Right Knee Flexion: 4/5 Right Knee Extension: 4/5 Right Ankle Dorsiflexion: 5/5 Right Ankle Plantar Flexion: 4+/5 LLE Assessment LLE Assessment: Exceptions to [facility] LLE Strength Left Hip Flexion: 3-/5 Left Hip Extension: 3+/5 Left Hip ABduction: 3+/5 Left Hip ADduction: 3+/5 Left Knee  Flexion: 4-/5 Left Knee Extension: 4/5 Left Ankle Dorsiflexion: 4+/5 Left Ankle Plantar Flexion: 4+/5  Care Tool Care Tool Bed Mobility Roll left and right activity   Roll left and right assist level: Supervision/Verbal cueing    Sit to lying activity   Sit to lying assist level: Supervision/Verbal cueing    Lying to sitting on side of bed activity   Lying to sitting on side of bed assist level: the ability to move from lying on the back to sitting on the side of the bed with no back support.: Supervision/Verbal cueing     Care Tool Transfers Sit to stand transfer   Sit to stand assist level: Minimal Assistance - Patient > 75%    Chair/bed transfer   Chair/bed transfer assist level: Minimal Assistance - Patient > 75%     Toilet transfer   Assist Level: Minimal Assistance - Patient > 75%    Car transfer   Car transfer assist level: Minimal Assistance - Patient > 75%      Care Tool Locomotion Ambulation   Assist level: Minimal Assistance - Patient > 75% Assistive device: Walker-rolling Max distance:  200 ft  Walk 10 feet activity   Assist level: Minimal Assistance - Patient > 75% Assistive device: No Device   Walk 50 feet with 2 turns activity   Assist level: Minimal Assistance - Patient > 75% Assistive device: Walker-rolling  Walk 150 feet activity   Assist level: Minimal Assistance - Patient > 75% Assistive device: Walker-rolling  Walk 10 feet on uneven surfaces activity Walk 10 feet on uneven surfaces activity did not occur: Safety/medical concerns      Stairs   Assist level: Minimal Assistance - Patient > 75% Stairs assistive device: 2 hand rails Max number of stairs: 8 (3")  Walk up/down 1 step activity   Walk up/down 1 step (curb) assist level: Minimal Assistance - Patient > 75% Walk up/down 1 step or curb assistive device: 2 hand rails  Walk up/down 4 steps activity   Walk up/down 4 steps assist level: Minimal Assistance - Patient > 75% Walk up/down 4  steps assistive device: 2 hand rails  Walk up/down 12 steps activity Walk up/down 12 steps activity did not occur: Safety/medical concerns      Pick up small objects from floor   Pick up small object from the floor assist level: Contact Guard/Touching assist    Wheelchair Is the patient using a wheelchair?: Yes Type of Wheelchair: Manual   Wheelchair assist level: Total Assistance - Patient < 25% Max wheelchair distance: 200 ft  Wheel 50 feet with 2 turns activity   Assist Level: Total Assistance - Patient < 25%  Wheel 150 feet activity   Assist Level: Total Assistance - Patient < 25%    Refer to Care Plan for Long Term Goals  SHORT TERM GOAL WEEK 1 PT Short Term Goal 1 (Week 1): STG = LTG d/t ELOS  Recommendations for other services: None   Skilled Therapeutic Intervention Mobility Bed Mobility Bed Mobility: Supine to Sit;Sit to Supine Supine to Sit: Supervision/Verbal cueing Sit to Supine: Minimal Assistance - Patient > 75% Transfers Transfers: Sit to Stand;Stand to Sit;Stand Pivot Transfers Sit to Stand: Minimal Assistance - Patient > 75% Stand to Sit: Minimal Assistance - Patient > 75% Stand Pivot Transfers: Moderate Assistance - Patient 50 - 74% Stand Pivot Transfer Details: Tactile cues for placement;Verbal cues for technique;Verbal cues for precautions/safety Transfer (Assistive device): Rolling walker Locomotion  Gait Ambulation: Yes Gait Distance (Feet): 200 Feet Assistive device: Rolling walker Gait Gait: Yes Gait Pattern: Impaired Gait Pattern: Step-through pattern;Decreased hip/knee flexion - left;Decreased hip/knee flexion - right;Narrow base of support;Scissoring;Trunk flexed Gait velocity: reduced; gait speed = 0.27 m/s Stairs / Additional Locomotion Stairs: Yes Stairs Assistance: Contact Guard/Touching assist;Minimal Assistance - Patient > 75% Stair Management Technique: Two rails Number of Stairs: 8 Height of Stairs: 3 Wheelchair  Mobility Wheelchair Mobility: No   PT Evaluation completed; see above for results. PT educated patient in roles of PT vs OT, PT POC, rehab potential, rehab goals, and discharge recommendations along with recommendation for follow-up rehabilitation services. Individual treatment initiated:  Patient seated upright in recliner upon PT arrival. Patient alert and agreeable to PT session. No pain complaint during session. Pt does complain of slight shaking of head for which she has to hold her head in her hands sometimes.   Therapeutic Activity: Bed Mobility: Patient performed supine to/from sit with MinA. Provided verbal cues for technique and use of bed features for now but will minimize use during stay to more mimic home environment. Transfers: Patient performed sit <> stand transfers throughout session improving from  MinA to supervision following NMR. Stand pivot performed using RW for balance and requires MinA initially. Provided vc/tc for technique throughout.  Gait Training:  Patient ambulated 135' x1/ 200' x1 feet using RW with MinA improving to CGA throughout session. Ambulated with NBOS improving in width of step throughout session with vc for performance. Provided vc/tc for upright posture and level gaze.  10 MWT performed with pt completing in 37sec demonstrating decreased gait speed for age.  On return to room, pt guided in ambulation toward bed with no AD. With conscious visualization and practice, pt demos increased steadiness with each step around bed and back to recliner.   Neuromuscular Re-ed: NMR facilitated during session with focus on standing balance, challenge to corrdination. Pt guided in Stevenson test. Patient demonstrates increased fall risk as noted by score of  29 /56 on Berg Balance Scale.  (<36= high risk for falls, close to 100%; 37-45 significant >80%; 46-51 moderate >50%; 52-55 lower >25%)  Pt guided in toe taps to 6' cone with BLE. Pt performs well with RLE, LLE  demos difficulty with "zeroing in" on toe touch just to top of cone. Pt guided in visualization of movement required and repeat performance. Pt improves in smooth movement pattern to target. Education provided re: need to provide time for motor plan to form in brain prior to performance in order to improve performance. Pt will require add'l time for smooth movement patterns while brin is healing. Pt demos understanding.   NMR performed for improvements in motor control and coordination, balance, sequencing, judgement, and self confidence/ efficacy in performing all aspects of mobility at highest level of independence.    Patient seated   in recliner at end of session with brakes locked, belt alarm set, and all needs within reach. Friend present in room to visit.  Discharge Criteria: Patient will be discharged from PT if patient refuses treatment 3 consecutive times without medical reason, if treatment goals not met, if there is a change in medical status, if patient makes no progress towards goals or if patient is discharged from hospital.  The above assessment, treatment plan, treatment alternatives and goals were discussed and mutually agreed upon: by patient  Alger Simons 08/04/2021, 4:15 PM

## 2021-08-04 NOTE — Plan of Care (Signed)
°  Problem: RH Problem Solving Goal: LTG Patient will demonstrate problem solving for (SLP) Description: LTG:  Patient will demonstrate problem solving for basic/complex daily situations with cues  (SLP) Flowsheets (Taken 08/04/2021 1025) LTG: Patient will demonstrate problem solving for (SLP): Complex daily situations LTG Patient will demonstrate problem solving for: Modified Independent   Problem: RH Memory Goal: LTG Patient will demonstrate ability for day to day (SLP) Description: LTG:   Patient will demonstrate ability for day to day recall/carryover during cognitive/linguistic activities with assist  (SLP) Flowsheets (Taken 08/04/2021 1025) LTG: Patient will demonstrate ability for day to day recall: Daily complex information LTG: Patient will demonstrate ability for day to day recall/carryover during cognitive/linguistic activities with assist (SLP): Modified Independent   Problem: RH Attention Goal: LTG Patient will demonstrate this level of attention during functional activites (SLP) Description: LTG:  Patient will will demonstrate this level of attention during functional activites (SLP) Flowsheets (Taken 08/04/2021 1025) Patient will demonstrate during cognitive/linguistic activities the attention type of:  Alternating  Selective Patient will demonstrate this level of attention during cognitive/linguistic activities in:  Controlled  Home LTG: Patient will demonstrate this level of attention during cognitive/linguistic activities with assistance of (SLP): Modified Independent

## 2021-08-04 NOTE — Evaluation (Signed)
Occupational Therapy Assessment and Plan  Patient Details  Name: [name] MRN: [id number] Date of Birth: [birth year]  OT Diagnosis: abnormal posture, ataxia, cognitive deficits, hemiplegia affecting non-dominant side, and muscle weakness (generalized) Rehab Potential: Rehab Potential (ACUTE ONLY): Excellent ELOS: 12-14 days   Today's Date: 08/04/2021 OT Individual Time: 0800-0903 OT Individual Time Calculation (min): 63 min     Hospital Problem: Principal Problem:   Acute ischemic left PCA stroke [facility])   Past Medical History:  Past Medical History:  Diagnosis Date   Blood in stool    Hyperlipidemia    Vaginal fibroids    Past Surgical History: History reviewed. No pertinent surgical history.  Assessment & Plan Clinical Impression: Patient is a 61 y.o. year old female with recent admission to the hospital on [name] is a 61 year old right-handed female with history of hyperlipidemia as well as prediabetes, tobacco use.  Patient on no prescription medications.  Per chart review patient lives with her mother.  1 level apartment.  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  Presented 07/28/2021 with dizziness, unsteady gait and bouts of vomiting.  She had initially been seen at the urgent care 07/28/2021 for headache nausea vomiting and low-grade fever without relief with BC powder.  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  Patient transferred to CIR on 08/03/2021 .    Patient currently requires min with basic self-care skills secondary to muscle weakness, impaired timing and sequencing, unbalanced muscle activation, ataxia, and decreased coordination, decreased memory, central origin, and decreased standing balance and decreased balance strategies.  Prior to hospitalization, patient could complete ADLs with independent .  Patient will benefit from skilled intervention to decrease level of assist with basic self-care skills and increase independence with  basic self-care skills prior to discharge home with care partner.  Anticipate patient will require 24 hour supervision and follow up home health.  OT - End of Session Activity Tolerance: Tolerates 30+ min activity with multiple rests Endurance Deficit: Yes (Simultaneous filing. User may not have seen previous data.) OT Assessment Rehab Potential (ACUTE ONLY): Excellent OT Patient demonstrates impairments in the following area(s): Balance;Safety;Endurance;Motor;Cognition OT Basic ADL's Functional Problem(s): Grooming;Bathing;Dressing;Toileting;Eating OT Advanced ADL's Functional Problem(s): Simple Meal Preparation OT Transfers Functional Problem(s): Toilet;Tub/Shower OT Additional Impairment(s): None OT Plan OT Intensity: Minimum of 1-2 x/day, 45 to 90 minutes OT Frequency: 5 out of 7 days OT Duration/Estimated Length of Stay: 12-14 days OT Treatment/Interventions: Balance/vestibular training;Discharge planning;Self Care/advanced ADL retraining;Therapeutic Activities;UE/LE Coordination activities;Cognitive remediation/compensation;Functional mobility training;Patient/family education;Therapeutic Exercise;Pain management;DME/adaptive equipment instruction;Neuromuscular re-education;UE/LE Strength taining/ROM OT Self Feeding Anticipated Outcome(s): Independent OT Basic Self-Care Anticipated Outcome(s): Supervision level OT Toileting Anticipated Outcome(s): Supervision OT Bathroom Transfers Anticipated Outcome(s): Supervision OT Recommendation Recommendations for Other Services: Therapeutic Recreation consult Therapeutic Recreation Interventions: Stress management Patient destination: Home Follow Up Recommendations: Home health OT Equipment Recommended: To be determined   OT Evaluation Precautions/Restrictions  Precautions Precautions: Fall Precaution Comments: L coordination deficit, mild trunk ataxia, Bil TED hose Restrictions Weight Bearing Restrictions: No General   Vital  Signs Therapy Vitals Temp: 99 F (37.2 C) Temp Source: Oral Pulse Rate: 70 Resp: 18 BP: 119/68 Patient Position (if appropriate): Sitting Oxygen Therapy SpO2: 100 % O2 Device: Room Air Pain Pain Assessment Pain Scale: 0-10 Pain Score: 0-No pain Home Living/Prior Functioning Home Living Available Help at Discharge: Family, Available 24 hours/day, Friend(s) Type of Home: Apartment Home Access: Stairs to enter [employer] of Steps: From parking space, pt has 6 low height  steps (two groups of 3 steps) with one side HR to reach front door Entrance Stairs-Rails: Right Home Layout: One level Bathroom Shower/Tub: Chiropodist: Standard 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  Lives With: Family IADL History Homemaking Responsibilities: Yes Meal Prep Responsibility: Secondary Laundry Responsibility: Secondary Cleaning Responsibility: Secondary Bill Paying/Finance Responsibility: Secondary Shopping Responsibility: Primary Current License: Yes Education: HS Prior Function Level of Independence: Independent with basic ADLs, Independent with homemaking with ambulation, Independent with gait, Independent with transfers  Able to Take Stairs?: Reciprically Driving: Yes Vocation: Full time employment Vocation Requirements: Librarian, 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 Vision Baseline Vision/History: 1 Wears glasses Ability to See in Adequate Light: 0 Adequate Patient Visual Report: Blurring of vision Vision Assessment?: Yes Eye Alignment: Within Functional Limits Alignment/Gaze Preference: Chin down;Head tilt Tracking/Visual Pursuits: Able to track stimulus in all quads without difficulty Saccades: Impaired - to be further tested in functional context Convergence: Impaired - to be further tested in functional context Visual Fields: No apparent deficits Additional Comments: Pt demos dizziness and brief nystagmus with 360 degree turn  toward R (CW direction). Perception  Perception: Within Functional Limits Praxis Praxis: Intact Cognition Overall Cognitive Status: Impaired/Different from baseline Arousal/Alertness: Awake/alert Orientation Level: Place;Situation;Person Person: Oriented Place: Oriented Situation: Oriented Year: 2023 Month: January Day of Week: Correct Memory: Impaired Memory Impairment: Retrieval deficit;Decreased short term memory Decreased Short Term Memory: Verbal complex;Functional complex Immediate Memory Recall: Sock;Blue;Bed Memory Recall Sock: Without Cue Memory Recall Blue: Not able to recall Memory Recall Bed: Not able to recall Attention: Sustained;Focused;Selective Focused Attention: Appears intact Sustained Attention: Appears intact Selective Attention: Impaired Awareness: Appears intact Problem Solving: Impaired Problem Solving Impairment: Verbal complex;Functional complex Organizing: Impaired Organizing Impairment: Functional complex;Verbal complex Safety/Judgment: Appears intact Sensation Sensation Light Touch: Appears Intact Hot/Cold: Appears Intact Proprioception: Appears Intact Stereognosis: Appears Intact Coordination Gross Motor Movements are Fluid and Coordinated: Yes Fine Motor Movements are Fluid and Coordinated: Yes Coordination and Movement Description: BUE coordination WFLs for finger to nose testing and functional use during ADL tasks.  Will continue to examine futher in treatment. Heel Shin Test: mild dysmetria and increased time to zero-in on target with LLE; WFL with  RLE Motor  Motor Motor: Ataxia;Other (comment) Motor - Skilled Clinical Observations: Swaying noted in sitting and in standing  Trunk/Postural Assessment  Cervical Assessment Cervical Assessment: Exceptions to [facility] (slight head tilt to the left) Thoracic Assessment Thoracic Assessment: Exceptions to [facility] (slight thoracic rounding) Lumbar Assessment Lumbar Assessment: Within Functional  Limits Postural Control Postural Control: Deficits on evaluation  Balance Balance Balance Assessed: Yes Standardized Balance Assessment Standardized Balance Assessment: Berg Balance Test Berg Balance Test Sit to Stand: Able to stand  independently using hands Standing Unsupported: Able to stand 30 seconds unsupported Sitting with Back Unsupported but Feet Supported on Floor or Stool: Able to sit 2 minutes under supervision Stand to Sit: Uses backs of legs against chair to control descent Transfers: Able to transfer with verbal cueing and /or supervision Standing Unsupported with Eyes Closed: Able to stand 3 seconds Standing Ubsupported with Feet Together: Needs help to attain position but able to stand for 30 seconds with feet together From Standing, Reach Forward with Outstretched Arm: Can reach forward >12 cm safely (5") From Standing Position, Pick up Object from Floor: Able to pick up shoe, needs supervision From Standing Position, Turn to Look Behind Over each Shoulder: Needs supervision when turning Turn 360 Degrees: Needs close supervision or verbal cueing Standing Unsupported, Alternately  Place Feet on Step/Stool: Able to complete >2 steps/needs minimal assist (Completes 8 toe touches with CGA and no AD with extra time for balance.) Standing Unsupported, One Foot in Front: Able to plae foot ahead of the other independently and hold 30 seconds Standing on One Leg: Able to lift leg independently and hold equal to or more than 3 seconds Total Score: 29 Static Sitting Balance Static Sitting - Balance Support: No upper extremity supported;Feet supported Static Sitting - Level of Assistance: 5: Stand by assistance Dynamic Sitting Balance Dynamic Sitting - Balance Support: Feet unsupported;During functional activity Dynamic Sitting - Level of Assistance: 5: Stand by assistance;4: Min assist (CGA) Static Standing Balance Static Standing - Balance Support: No upper extremity  supported;During functional activity Static Standing - Level of Assistance: 4: Min assist Dynamic Standing Balance Dynamic Standing - Balance Support: During functional activity Dynamic Standing - Level of Assistance: 3: Mod assist Extremity/Trunk Assessment RUE Assessment RUE Assessment: Within Functional Limits General Strength Comments: strength 4/5 throughout LUE Assessment LUE Assessment: Exceptions to [facility] General Strength Comments: strength 3+/5 throughout, pt with recent wrist fx as well per her report.  Care Tool Care Tool Self Care Eating   Eating Assist Level: Set up assist    Oral Care    Oral Care Assist Level: Minimal Assistance - Patient > 75% (standing at the sink)    Bathing   Body parts bathed by patient: Right arm;Left arm;Chest;Abdomen;Front perineal area;Buttocks;Right upper leg;Left upper leg;Right lower leg;Left lower leg;Face     Assist Level: Minimal Assistance - Patient > 75%    Upper Body Dressing(including orthotics)   What is the patient wearing?: Pull over shirt   Assist Level: Set up assist    Lower Body Dressing (excluding footwear)   What is the patient wearing?: Pants;Underwear/pull up Assist for lower body dressing: Minimal Assistance - Patient > 75%    Putting on/Taking off footwear   What is the patient wearing?: Non-skid slipper socks;Ted hose Assist for footwear: Moderate Assistance - Patient 50 - 74%       Care Tool Toileting Toileting activity   Assist for toileting: Minimal Assistance - Patient > 75%     Care Tool Bed Mobility Roll left and right activity        Sit to lying activity        Lying to sitting on side of bed activity   Lying to sitting on side of bed assist level: the ability to move from lying on the back to sitting on the side of the bed with no back support.: Supervision/Verbal cueing     Care Tool Transfers Sit to stand transfer   Sit to stand assist level: Minimal Assistance - Patient > 75%     Chair/bed transfer   Chair/bed transfer assist level: Moderate Assistance - Patient 50 - 74%     Toilet transfer   Assist Level: Moderate Assistance - Patient 50 - 74%     Care Tool Cognition  Expression of Ideas and Wants Expression of Ideas and Wants: 4. Without difficulty (complex and basic) - expresses complex messages without difficulty and with speech that is clear and easy to understand  Understanding Verbal and Non-Verbal Content Understanding Verbal and Non-Verbal Content: 4. Understands (complex and basic) - clear comprehension without cues or repetitions   Memory/Recall Ability Memory/Recall Ability : Current season;That he or she is in a hospital/hospital unit   Refer to Care Plan for [place] 1  OT Short Term Goal 1 (Week 1): Pt will complete LB bathing at min guard assist sit to stand for two consecutive sessions. OT Short Term Goal 2 (Week 1): Pt will complete LB dressing with min guard assist sit to stand for two consecutive sessions. OT Short Term Goal 3 (Week 1): Pt will complete toilet transfers with use of the LRAD and elevated toilet with min guard assist. OT Short Term Goal 4 (Week 1): Pt will complete tub/shower transfer with min guard assist using the RW and tub bench.  Recommendations for other services: Therapeutic Recreation  Stress management   Skilled Therapeutic Intervention ADL ADL Eating: Set up Where Assessed-Eating: Chair Grooming: Minimal assistance Where Assessed-Grooming: Standing at sink Upper Body Bathing: Setup Where Assessed-Upper Body Bathing: Chair;Shower Lower Body Bathing: Minimal assistance Where Assessed-Lower Body Bathing: Shower;Chair Upper Body Dressing: Supervision/safety Where Assessed-Upper Body Dressing: Edge of bed Lower Body Dressing: Minimal assistance Where Assessed-Lower Body Dressing: Edge of bed Toileting: Minimal assistance Where Assessed-Toileting: Bedside Commode Toilet Transfer:  Moderate assistance Toilet Transfer Method: Counselling psychologist: Energy manager: Not assessed Social research officer, government: Moderate assistance Social research officer, government Method: Heritage manager: Manufacturing systems engineer  Bed Mobility Bed Mobility: Supine to Sit;Sit to Supine Supine to Sit: Supervision/Verbal cueing Sit to Supine: Minimal Assistance - Patient > 75% Transfers Sit to Stand: Minimal Assistance - Patient > 75% Stand to Sit: Minimal Assistance - Patient > 75%  Session Note:  Pt in bed to start, agreeable to completion of shower when given choice to evaluate selfcare function.  She was able to transfer to the EOB with supervision and maintain static sitting balance at supervision level.  Noted increased swaying while sitting but no LOB.  Mod assist for functional mobility to the shower without and assistive device secondary to frequent LOB to both the right and left.  She was able to complete bathing with min guard assist sit to stand and with use of the grab bars for support.  Min instructional cueing needed to safety to not stand up to wash her peri area while covered with soap but to rinse off first.  She was able to ambulate back out to the EOB for dressing with mod assist and no device.  Dressing completed at overall min assist with standing at the sink to complete oral hygiene at the same level.  Slight dizziness reported with quick turning of head at the sink which may be indicative of central vestibular deficits, will continue to look at in treatment.  Min assist ambulation was completed with the RW to the door and then to her chair to rest.  Discussed expectations for 24 hr supervision initially at home and supervision level goals.  She is in agreement with this and will have assist from her mom and the pt's boyfriend.  Pt left with the call button and phone in reach and safety alarm belt in place.   Discharge Criteria: Patient will  be discharged from OT if patient refuses treatment 3 consecutive times without medical reason, if treatment goals not met, if there is a change in medical status, if patient makes no progress towards goals or if patient is discharged from hospital.  The above assessment, treatment plan, treatment alternatives and goals were discussed and mutually agreed upon: by patient  Sharunda Salmon OTR/L 08/04/2021, 4:13 PM

## 2021-08-04 NOTE — Progress Notes (Signed)
Provided patient education about diet. Consult to registered nutrition put in for more education related to pre-diabetes. Discussed smoking cessation and available support outside hospital but patient would benefit from written list of resources prior to discharge related to quitting smoking permanently.

## 2021-08-05 DIAGNOSIS — I63532 Cerebral infarction due to unspecified occlusion or stenosis of left posterior cerebral artery: Secondary | ICD-10-CM | POA: Diagnosis not present

## 2021-08-05 LAB — BASIC METABOLIC PANEL
Anion gap: 8 (ref 5–15)
BUN: 13 mg/dL (ref 6–20)
CO2: 26 mmol/L (ref 22–32)
Calcium: 8.9 mg/dL (ref 8.9–10.3)
Chloride: 104 mmol/L (ref 98–111)
Creatinine, Ser: 0.81 mg/dL (ref 0.44–1.00)
GFR, Estimated: >60 mL/min (ref >60)
Glucose, Bld: 100 mg/dL — ABNORMAL HIGH (ref 70–99)
Potassium: 4.3 mmol/L (ref 3.5–5.1)
Sodium: 138 mmol/L (ref 135–145)

## 2021-08-05 LAB — GLUCOSE, CAPILLARY: Glucose-Capillary: 108 mg/dL — ABNORMAL HIGH (ref 70–99)

## 2021-08-05 LAB — MAGNESIUM: Magnesium: 1.9 mg/dL (ref 1.7–2.4)

## 2021-08-05 LAB — VITAMIN D 25 HYDROXY (VIT D DEFICIENCY, FRACTURES): Vit D, 25-Hydroxy: 23.79 ng/mL — ABNORMAL LOW (ref 30–100)

## 2021-08-05 MED ORDER — VITAMIN D (ERGOCALCIFEROL) 1.25 MG (50000 UNIT) PO CAPS
50000.0000 [IU] | ORAL_CAPSULE | ORAL | Status: DC
  Administered 2021-08-05 – 2021-08-12 (×2): 50000 [IU] via ORAL
  Filled 2021-08-05 (×2): qty 1

## 2021-08-05 MED ORDER — MAGNESIUM GLUCONATE 500 MG PO TABS
250.0000 mg | ORAL_TABLET | Freq: Every day | ORAL | Status: DC
  Administered 2021-08-05 – 2021-08-06 (×2): 250 mg via ORAL
  Filled 2021-08-05 (×2): qty 1

## 2021-08-05 NOTE — Progress Notes (Signed)
PROGRESS NOTE   Subjective/Complaints: Tolerated OT well today In gym, spoke with her co-worker in room who is visiting.  Nursing reported urinary frequency and urgency present prior to admission  ROS: +urinary frequency and urgency since prior to admission   Objective:   No results found. No results for input(s): WBC, HGB, HCT, PLT in the last 72 hours. Recent Labs    08/05/21 0458  NA 138  K 4.3  CL 104  CO2 26  GLUCOSE 100*  BUN 13  CREATININE 0.81  CALCIUM 8.9    Intake/Output Summary (Last 24 hours) at 08/05/2021 1249 Last data filed at 08/05/2021 0730 Gross per 24 hour  Intake 894 ml  Output --  Net 894 ml        Physical Exam: Vital Signs Blood pressure 103/64, pulse 65, temperature 98.2 F (36.8 C), temperature source Oral, resp. rate 18, height 5\' 3"  (1.6 m), weight 69.2 kg, last menstrual period 07/15/2009, SpO2 100 %. Gen: no distress, normal appearing HEENT: oral mucosa pink and moist, NCAT Cardio: Bradycardic Chest: normal effort, normal rate of breathing Abd: soft, non-distended Ext: no edema Psych: pleasant, normal affect Musculoskeletal:        General: No swelling. Normal range of motion.     Cervical back: Normal range of motion.     Comments: Mild tenderness over the dorsolateral aspect of left metatarsals  Skin:    General: Skin is warm and dry.     Comments: Long pink finger nails  Neurological:     Mental Status: She is alert.     Comments: Patient is alert.  No acute distress.  Makes eye contact with examiner.  Follows commands.  Provides name and age.  Fair insight and awareness. Alert and oriented x 3. Normal insight and awareness. Intact Memory. Normal language and speech. Cranial nerve exam unremarkable. Good sitting balance. Strength 4-5/5 in all 4's. Sensation intact to LT and pain in all 4's. Decreased FTN, FMC RUE>LUE. DTR's 2+  Psychiatric:        Mood and Affect: Mood  normal.        Behavior: Behavior normal.    Assessment/Plan: 1. Functional deficits which require 3+ hours per day of interdisciplinary therapy in a comprehensive inpatient rehab setting. Physiatrist is providing close team supervision and 24 hour management of active medical problems listed below. Physiatrist and rehab team continue to assess barriers to discharge/monitor patient progress toward functional and medical goals  Care Tool:  Bathing    Body parts bathed by patient: Right arm, Left arm, Chest, Abdomen, Front perineal area, Buttocks, Right upper leg, Left upper leg, Right lower leg, Left lower leg, Face         Bathing assist Assist Level: Minimal Assistance - Patient > 75%     Upper Body Dressing/Undressing Upper body dressing   What is the patient wearing?: Pull over shirt    Upper body assist Assist Level: Set up assist    Lower Body Dressing/Undressing Lower body dressing      What is the patient wearing?: Underwear/pull up     Lower body assist Assist for lower body dressing: Contact Guard/Touching assist  Toileting Toileting    Toileting assist Assist for toileting: Minimal Assistance - Patient > 75%     Transfers Chair/bed transfer  Transfers assist     Chair/bed transfer assist level: Minimal Assistance - Patient > 75%     Locomotion Ambulation   Ambulation assist      Assist level: Minimal Assistance - Patient > 75% Assistive device: Walker-rolling Max distance: 200 ft   Walk 10 feet activity   Assist     Assist level: Minimal Assistance - Patient > 75% Assistive device: No Device   Walk 50 feet activity   Assist    Assist level: Minimal Assistance - Patient > 75% Assistive device: Walker-rolling    Walk 150 feet activity   Assist    Assist level: Minimal Assistance - Patient > 75% Assistive device: Walker-rolling    Walk 10 feet on uneven surface  activity   Assist Walk 10 feet on uneven surfaces  activity did not occur: Safety/medical concerns         Wheelchair     Assist Is the patient using a wheelchair?: Yes Type of Wheelchair: Manual    Wheelchair assist level: Total Assistance - Patient < 25% Max wheelchair distance: 200 ft    Wheelchair 50 feet with 2 turns activity    Assist        Assist Level: Total Assistance - Patient < 25%   Wheelchair 150 feet activity     Assist      Assist Level: Total Assistance - Patient < 25%   Blood pressure 103/64, pulse 65, temperature 98.2 F (36.8 C), temperature source Oral, resp. rate 18, height 5\' 3"  (1.6 m), weight 69.2 kg, last menstrual period 07/15/2009, SpO2 100 %.  Medical Problem List and Plan: 1. 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  Antithrombotics: -DVT/anticoagulation:  Mechanical:  Antiembolism stockings, knee (TED hose) Bilateral lower extremities             -antiplatelet therapy: Aspirin 81 mg daily 3. Pain Management: Tylenol as needed 4. Mood: Provide emotional support             -antipsychotic agents: N/A 5. Neuropsych: This patient is capable of making decisions on her own behalf. 6. Skin/Wound Care: Routine skin checks 7. Fluids/Electrolytes/Nutrition: Routine in and outs with follow-up chemistries 8.  Hyperlipidemia.  Crestor 9.  Prediabetes.  Hemoglobin A1c 6.1.  SSI discontinued. 10. Overweight: BMI 27.03: provide dietary education.  11. Hypernatremia: repeat Na tomorrow.  12. Bradycardia: continue to monitor TID 13. HLD: continue Crestor 20mg .  14. Vitamin D deficiency: start ergocalciferol 50,000U once per week for 7 weeks 15. Suboptimal magnesium levels: start magnesium gluconate 250mg  HS 16. Constipation: d/c miralax.     LOS: 2  days A FACE TO FACE EVALUATION WAS PERFORMED  Izora Ribas 08/05/2021, 12:49 PM

## 2021-08-06 DIAGNOSIS — I63532 Cerebral infarction due to unspecified occlusion or stenosis of left posterior cerebral artery: Secondary | ICD-10-CM | POA: Diagnosis not present

## 2021-08-06 LAB — COMPREHENSIVE METABOLIC PANEL
ALT: 25 U/L (ref 0–44)
AST: 24 U/L (ref 15–41)
Albumin: 3.6 g/dL (ref 3.5–5.0)
Alkaline Phosphatase: 93 U/L (ref 38–126)
Anion gap: 9 (ref 5–15)
BUN: 13 mg/dL (ref 6–20)
CO2: 29 mmol/L (ref 22–32)
Calcium: 9.5 mg/dL (ref 8.9–10.3)
Chloride: 101 mmol/L (ref 98–111)
Creatinine, Ser: 0.87 mg/dL (ref 0.44–1.00)
GFR, Estimated: >60 mL/min (ref >60)
Glucose, Bld: 95 mg/dL (ref 70–99)
Potassium: 4.4 mmol/L (ref 3.5–5.1)
Sodium: 139 mmol/L (ref 135–145)
Total Bilirubin: 0.5 mg/dL (ref 0.3–1.2)
Total Protein: 7.7 g/dL (ref 6.5–8.1)

## 2021-08-06 LAB — CBC WITH DIFFERENTIAL/PLATELET
Abs Immature Granulocytes: 0.03 10*3/uL (ref 0.00–0.07)
Basophils Absolute: 0 10*3/uL (ref 0.0–0.1)
Basophils Relative: 0 %
Eosinophils Absolute: 0.1 10*3/uL (ref 0.0–0.5)
Eosinophils Relative: 2 %
HCT: 43.4 % (ref 36.0–46.0)
Hemoglobin: 14.6 g/dL (ref 12.0–15.0)
Immature Granulocytes: 0 %
Lymphocytes Relative: 32 %
Lymphs Abs: 2.2 10*3/uL (ref 0.7–4.0)
MCH: 30.7 pg (ref 26.0–34.0)
MCHC: 33.6 g/dL (ref 30.0–36.0)
MCV: 91.2 fL (ref 80.0–100.0)
Monocytes Absolute: 0.8 10*3/uL (ref 0.1–1.0)
Monocytes Relative: 12 %
Neutro Abs: 3.7 10*3/uL (ref 1.7–7.7)
Neutrophils Relative %: 54 %
Platelets: 228 10*3/uL (ref 150–400)
RBC: 4.76 MIL/uL (ref 3.87–5.11)
RDW: 14.2 % (ref 11.5–15.5)
WBC: 6.8 10*3/uL (ref 4.0–10.5)
nRBC: 0 % (ref 0.0–0.2)

## 2021-08-06 LAB — GLUCOSE, CAPILLARY: Glucose-Capillary: 92 mg/dL (ref 70–99)

## 2021-08-06 NOTE — Plan of Care (Signed)
°  Problem: RH Balance Goal: LTG Patient will maintain dynamic sitting balance (PT) Description: LTG:  Patient will maintain dynamic sitting balance with assistance during mobility activities (PT) Flowsheets (Taken 08/04/2021 1522) LTG: Pt will maintain dynamic sitting balance during mobility activities with:: Independent   Problem: Sit to Stand Goal: LTG:  Patient will perform sit to stand with assistance level (PT) Description: LTG:  Patient will perform sit to stand with assistance level (PT) Flowsheets (Taken 08/04/2021 1522) LTG: PT will perform sit to stand in preparation for functional mobility with assistance level: Independent with assistive device   Problem: RH Bed Mobility Goal: LTG Patient will perform bed mobility with assist (PT) Description: LTG: Patient will perform bed mobility with assistance, with/without cues (PT). Flowsheets (Taken 08/04/2021 1522) LTG: Pt will perform bed mobility with assistance level of: Independent   Problem: RH Bed to Chair Transfers Goal: LTG Patient will perform bed/chair transfers w/assist (PT) Description: LTG: Patient will perform bed to chair transfers with assistance (PT). Flowsheets (Taken 08/04/2021 1522) LTG: Pt will perform Bed to Chair Transfers with assistance level: Supervision/Verbal cueing   Problem: RH Car Transfers Goal: LTG Patient will perform car transfers with assist (PT) Description: LTG: Patient will perform car transfers with assistance (PT). Flowsheets (Taken 08/04/2021 1522) LTG: Pt will perform car transfers with assist:: Supervision/Verbal cueing   Problem: RH Furniture Transfers Goal: LTG Patient will perform furniture transfers w/assist (OT/PT) Description: LTG: Patient will perform furniture transfers  with assistance (OT/PT). Flowsheets (Taken 08/04/2021 1522) LTG: Pt will perform furniture transfers with assist:: Supervision/Verbal cueing   Problem: RH Ambulation Goal: LTG Patient will ambulate in controlled  environment (PT) Description: LTG: Patient will ambulate in a controlled environment, # of feet with assistance (PT). Flowsheets (Taken 08/04/2021 1522) LTG: Pt will ambulate in controlled environ  assist needed:: Supervision/Verbal cueing LTG: Ambulation distance in controlled environment: at least 150 ft using LRAD Goal: LTG Patient will ambulate in home environment (PT) Description: LTG: Patient will ambulate in home environment, # of feet with assistance (PT). Flowsheets (Taken 08/04/2021 1522) LTG: Pt will ambulate in home environ  assist needed:: Supervision/Verbal cueing LTG: Ambulation distance in home environment: at least 50 ft using LRAD   Problem: RH Stairs Goal: LTG Patient will ambulate up and down stairs w/assist (PT) Description: LTG: Patient will ambulate up and down # of stairs with assistance (PT) Flowsheets (Taken 08/04/2021 1522) LTG: Pt will ambulate up/down stairs assist needed:: Supervision/Verbal cueing LTG: Pt will  ambulate up and down number of stairs: at least 6 curb height steps using HR as per home environment in order to enter/ exit home and mobilize in community

## 2021-08-06 NOTE — Progress Notes (Signed)
Inpatient Rehabilitation  Patient information reviewed and entered into eRehab system by Joseh Sjogren M. Mahmood Boehringer, M.A., CCC/SLP, PPS Coordinator.  Information including medical coding, functional ability and quality indicators will be reviewed and updated through discharge.    

## 2021-08-06 NOTE — Progress Notes (Signed)
Inpatient Rehabilitation Care Coordinator Assessment and Plan Patient Details  Name: [name] MRN: [id number] Date of Birth: 1961/03/12  Today's Date: 08/06/2021  Hospital Problems: Principal Problem:   Acute ischemic left PCA stroke [facility])  Past Medical History:  Past Medical History:  Diagnosis Date   Blood in stool    Hyperlipidemia    Vaginal fibroids    Past Surgical History: History reviewed. No pertinent surgical history. Social History:  reports that she has quit smoking. She does not have any smokeless tobacco history on file. She reports that she does not drink alcohol and does not use drugs.  Family / Support Systems Marital Status: Single Patient Roles: Parent, Building control surveyor, Other (Comment) (employee) Other Supports: Peggy-Mom [phone] Anticipated Caregiver: Vickii Chafe can provide superivison level in good health but needs to be careful Ability/Limitations of Caregiver: Supervision only does not drive either Caregiver Availability: 24/7 Family Dynamics: Close with Mom have lived together for 23 years and both get along well. Pt is very independent and assists mom and works full time  Social History Preferred language: English Religion:  Cultural Background: No issues Education: HS Health Literacy - How often do you need to have someone help you when you read instructions, pamphlets, or other written material from your doctor or pharmacy?: Never Employment Status: Employed Name of Employer: [employer]-3rd shift Return to Work Plans: Hopes to return to work has been there for many years Public relations account executive Issues: No issues Guardian/Conservator: None-according to MD pt is capable of making her own decisions while here. Mom does not like hospitals and will not be visiting her here   Abuse/Neglect Abuse/Neglect Assessment Can Be Completed: Yes Physical Abuse: Denies Verbal Abuse: Denies Sexual Abuse: Denies Exploitation of patient/patient's  resources: Denies Self-Neglect: Denies  Patient response to: Social Isolation - How often do you feel lonely or isolated from those around you?: Never  Emotional Status Pt's affect, behavior and adjustment status: Pt is very motivated and feels good seeing she had made progress in the past few days. She is wanting to recover and be mod/i by the time she goes home. She is not one to sit and is very active prior to this happening to her. Recent Psychosocial Issues: pt thought she was healthy pror to admission Psychiatric History: No history-seems to be coping appropriately well and able to verbalize her concerns and feelings. May benefit from seeing neuro-psych while here. Substance Abuse History: No issues  Patient / Family Perceptions, Expectations & Goals Pt/Family understanding of illness & functional limitations: Pt is able to explain her stroke and the reason for it. She talks with the MD daily and feels he questions and concerns are being addressed. She wants to do what she can to prevent another stroke. She states: " One and done." Premorbid pt/family roles/activities: Employee, daughter, friend, co-worker, neighbor, etc Anticipated changes in roles/activities/participation: resume Pt/family expectations/goals: Pt states: " I want to be able to take care of myself before I leave here. 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 Is the patient able to respond to transportation needs?: Yes In the past 12 months, has lack of transportation kept you from medical appointments or from getting medications?: Yes In the past 12 months, has lack of transportation kept you from meetings, work, or from getting things needed for daily living?: Yes Resource referrals  recommended: Neuropsychology  Discharge Planning Living  Arrangements: Parent Support Systems: Parent, Friends/neighbors Type of Residence: Private residence Insurance Resources: Multimedia programmer (specify) Sports administrator) Financial Resources: Employment, Secondary school teacher Screen Referred: No Living Expenses: Rent Money Management: Patient Does the patient have any problems obtaining your medications?: No Home Management: pt and Mom Patient/Family Preliminary Plans: Return home with Mom who can provide supervision levle, she can not provide physical care, but is is not likely pt will need this at DC. She is fairly high level and should do well here. Care Coordinator Barriers to Discharge: Decreased caregiver support, Insurance for SNF coverage Care Coordinator Anticipated Follow Up Needs: HH/OP  Clinical Impression Pleasant very motivated female who is used to being active and working. She does work third shift and aware if therapy schedule needs to change, can do this. Her Mom is involved and can provide supervision level. 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 Aware team conference on Wed, will work on discharge needs. Aware to bring in forms from work to be completed. May benefit from seeing neuro-psych while here, if able  Elease Hashimoto 08/06/2021, 10:17 AM

## 2021-08-06 NOTE — Progress Notes (Addendum)
Speech Language Pathology Daily Session Note  Patient Details  Name: [name] MRN: [id number] Date of Birth: 1961/07/13  Today's Date: 08/06/2021 SLP Individual Time: 1116-1200 SLP Individual Time Calculation (min): 44 min  Short Term Goals: Week 1: SLP Short Term Goal 1 (Week 1): Pt will complete money/med management tasks with Supervision A verbal cues and extra time as needed SLP Short Term Goal 2 (Week 1): Pt will recall complex concepts/information with use of compensatory memory aid with Supervision A SLP Short Term Goal 3 (Week 1): Pt will complete moderately complex problem solving tasks with Supervision A and extra time as needed SLP Short Term Goal 4 (Week 1): Pt will participate in alternating attention task with Supervision A cues for accuracy  Skilled Therapeutic Interventions:Skilled ST services focused on cognitive skills. SLP facilitated mildly complex problem solving skills in medication management task with BID pill organizer, pt required supervision A verbal cues fading to mod I for novel concept. SLP also facilitated complex problem solving and working memory in simple deductive reasoning tasks, pt required mod A fading to supervision A verbal cues with extra time, demonstrating great carryover of strategies. Pt was able to describe personal money management sytem paying bills via phone and keeping track of receipts via e-mail, which appeared to be accurate and a plan that worked for pt. SLP will continue to focus on higher level attention, memory and problem solving skills. Pt was left in room with call bell within reach and chair alarm set. SLP recommends to continue skilled services.     Pain Pain Assessment Pain Score: 0-No pain  Therapy/Group: Individual Therapy  Kort Stettler  [facility] 08/06/2021, 12:25 PM

## 2021-08-06 NOTE — Progress Notes (Signed)
PROGRESS NOTE   Subjective/Complaints: Tolerating therapy well today Denies pain Patient's chart reviewed- No issues reported overnight Vitals signs stable   ROS: +urinary frequency and urgency since prior to admission   Objective:   No results found. Recent Labs    08/06/21 0632  WBC 6.8  HGB 14.6  HCT 43.4  PLT 228   Recent Labs    08/05/21 0458 08/06/21 0632  NA 138 139  K 4.3 4.4  CL 104 101  CO2 26 29  GLUCOSE 100* 95  BUN 13 13  CREATININE 0.81 0.87  CALCIUM 8.9 9.5    Intake/Output Summary (Last 24 hours) at 08/06/2021 1237 Last data filed at 08/06/2021 0708 Gross per 24 hour  Intake 857 ml  Output --  Net 857 ml        Physical Exam: Vital Signs Blood pressure 127/70, pulse 71, temperature 97.6 F (36.4 C), temperature source Oral, resp. rate 16, height 5\' 3"  (1.6 m), weight 69.2 kg, last menstrual period 07/15/2009, SpO2 100 %. Gen: no distress, normal appearing, BMI 27.03 HEENT: oral mucosa pink and moist, NCAT Cardio: Bradycardic Chest: normal effort, normal rate of breathing Abd: soft, non-distended Ext: no edema Psych: pleasant, normal affect Musculoskeletal:        General: No swelling. Normal range of motion.     Cervical back: Normal range of motion.     Comments: Mild tenderness over the dorsolateral aspect of left metatarsals  Skin:    General: Skin is warm and dry.     Comments: Long pink finger nails  Neurological:     Mental Status: She is alert.     Comments: Patient is alert.  No acute distress.  Makes eye contact with examiner.  Follows commands.  Provides name and age.  Fair insight and awareness. Alert and oriented x 3. Normal insight and awareness. Intact Memory. Normal language and speech. Cranial nerve exam unremarkable. Good sitting balance. Strength 4-5/5 in all 4's. Sensation intact to LT and pain in all 4's. Decreased FTN, FMC RUE>LUE. DTR's 2+  Psychiatric:         Mood and Affect: Mood normal.        Behavior: Behavior normal.    Assessment/Plan: 1. Functional deficits which require 3+ hours per day of interdisciplinary therapy in a comprehensive inpatient rehab setting. Physiatrist is providing close team supervision and 24 hour management of active medical problems listed below. Physiatrist and rehab team continue to assess barriers to discharge/monitor patient progress toward functional and medical goals  Care Tool:  Bathing    Body parts bathed by patient: Right arm, Left arm, Chest, Abdomen, Front perineal area, Buttocks, Right upper leg, Left upper leg, Right lower leg, Left lower leg, Face         Bathing assist Assist Level: Minimal Assistance - Patient > 75%     Upper Body Dressing/Undressing Upper body dressing   What is the patient wearing?: Pull over shirt    Upper body assist Assist Level: Set up assist    Lower Body Dressing/Undressing Lower body dressing      What is the patient wearing?: Underwear/pull up     Lower body assist  Assist for lower body dressing: Contact Guard/Touching assist     Toileting Toileting    Toileting assist Assist for toileting: Minimal Assistance - Patient > 75%     Transfers Chair/bed transfer  Transfers assist     Chair/bed transfer assist level: Minimal Assistance - Patient > 75%     Locomotion Ambulation   Ambulation assist      Assist level: Minimal Assistance - Patient > 75% Assistive device: Walker-rolling Max distance: 200 ft   Walk 10 feet activity   Assist     Assist level: Minimal Assistance - Patient > 75% Assistive device: No Device   Walk 50 feet activity   Assist    Assist level: Minimal Assistance - Patient > 75% Assistive device: Walker-rolling    Walk 150 feet activity   Assist    Assist level: Minimal Assistance - Patient > 75% Assistive device: Walker-rolling    Walk 10 feet on uneven surface  activity   Assist Walk  10 feet on uneven surfaces activity did not occur: Safety/medical concerns         Wheelchair     Assist Is the patient using a wheelchair?: Yes Type of Wheelchair: Manual    Wheelchair assist level: Total Assistance - Patient < 25% Max wheelchair distance: 200 ft    Wheelchair 50 feet with 2 turns activity    Assist        Assist Level: Total Assistance - Patient < 25%   Wheelchair 150 feet activity     Assist      Assist Level: Total Assistance - Patient < 25%   Blood pressure 127/70, pulse 71, temperature 97.6 F (36.4 C), temperature source Oral, resp. rate 16, height 5\' 3"  (1.6 m), weight 69.2 kg, last menstrual period 07/15/2009, SpO2 100 %.  Medical Problem List and Plan: 1. 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  Antithrombotics: -DVT/anticoagulation:  Mechanical:  Antiembolism stockings, knee (TED hose) Bilateral lower extremities             -antiplatelet therapy: Aspirin 81 mg daily 3. Pain Management: Tylenol as needed 4. Mood: Provide emotional support             -antipsychotic agents: N/A 5. Neuropsych: This patient is capable of making decisions on her own behalf. 6. Skin/Wound Care: Routine skin checks 7. Fluids/Electrolytes/Nutrition: Routine in and outs with follow-up chemistries 8.  Cognitive deficits: continue SLP 9.  Prediabetes.  Hemoglobin A1c 6.1.  SSI discontinued. Provide list of foods to help improve insulin sensitivity.  10. Overweight: BMI 27.03: provide dietary education.  11. Hypernatremia: normalized 12. Bradycardia: continue to monitor TID 13. HLD: LDL 61. continue Crestor 20mg . Recommend Coenzyme q10 14. Vitamin D deficiency: start ergocalciferol  50,000U once per week for 7 weeks 15. Suboptimal magnesium levels: start magnesium gluconate 250mg  HS 16. Constipation: d/c miralax.     LOS: 3 days A FACE TO FACE EVALUATION WAS PERFORMED  Izora Ribas 08/06/2021, 12:37 PM

## 2021-08-06 NOTE — Progress Notes (Signed)
[facility] Individual Statement of Services  Patient Name:  ANNALI LYBRAND  Date:  08/06/2021  Welcome to the [facility].  Our goal is to provide you with an individualized program based on your diagnosis and situation, designed to meet your specific needs.  With this comprehensive rehabilitation program, you will be expected to participate in at least 3 hours of rehabilitation therapies Monday-Friday, with modified therapy programming on the weekends.  Your rehabilitation program will include the following services:  Physical Therapy (PT), Occupational Therapy (OT), Speech Therapy (ST), 24 hour per day rehabilitation nursing, Therapeutic Recreaction (TR), Neuropsychology, Care Coordinator, Rehabilitation Medicine, Nutrition Services, and Pharmacy Services  Weekly team conferences will be held on Wednesday to discuss your progress.  Your Inpatient Rehabilitation Care Coordinator will talk with you frequently to get your input and to update you on team discussions.  Team conferences with you and your family in attendance may also be held.  Expected length of stay: 8-10 days  Overall anticipated outcome: supervision with cues  Depending on your progress and recovery, your program may change. Your Inpatient Rehabilitation Care Coordinator will coordinate services and will keep you informed of any changes. Your Inpatient Rehabilitation Care Coordinator's name and contact numbers are listed  below.  The following services may also be recommended but are not provided by the [facility] will be made to provide these services after discharge if needed.  Arrangements include referral to agencies that provide these services.  Your insurance has been verified to be:  [employer]Com[employer] Your primary doctor is:   Webb Silversmith  Pertinent information will be shared with your doctor and your insurance company.  Inpatient Rehabilitation Care Coordinator:  Ovidio Kin, [place] or Emilia Beck  Information discussed with and copy given to patient by: Elease Hashimoto, 08/06/2021, 10:19 AM

## 2021-08-06 NOTE — Progress Notes (Signed)
Physical Therapy Session Note  Patient Details  Name: [name] MRN: [id number] Date of Birth: May 26, 1961  Today's Date: 08/06/2021 PT Individual Time: 0802-0912 + [id number] PT Individual Time Calculation (min): 70 min +27 min  Short Term Goals: Week 1:  PT Short Term Goal 1 (Week 1): STG = LTG d/t ELOS  Skilled Therapeutic Interventions/Progress Updates:     Session 1 Pt met sitting EOB, denied pain, and agreeable to PT at start of session. Pt donned, bra, shirt, underwear, and pants w/ S. TED hose, socks, and shoes donned maxA for time w/ pt tieing shoes independently. Pt CGA for all tasks completed during session. Pt ambulated w/ RW from room to 48M rehab gym ~176f. TUG, 5xSTS, and FGA completed w/ averages noted below. Slow TUG noted as pt slowed speed considerable to focus on walker placement w/ en block turning throughout session. Pt educated on results, sit<>stand w/ AD, and nose over toes during 5xSTS after 1 posterior LOB. Pt voiced understanding and demonstrated understanding throughout session. Pt demonstrated difficulity w/ L foot proprioception during FGA and ambulation, constantly requiring her to look down to see foot placement and leading to narrow BoS while walking w/ eyes closed. Dizziness noted after vertical head turns that was relieved w/ sitting rest break. Occasional swaying noted in seated w/o LOB. Pt fatigued at end of session and ambulated back to room, transferred to recliner, seat belt on, call bell in hand, and all needs met.  TUG - 60s 5xSTS - 24s FGA - 11/30  *TUG > 13.5s = increased falls risk *5xSTS > 15s = increased falls risk *FGA <22/30 = increased falls risk  Session 2 Pt met sitting in recliner ready for PT and denied pain. Pt requested to go to bathroom and ambulated w/ RW CGA to bathroom to perform peri-care w/ S. Multimodal cues required throughout session to widen BoS during ambulation due to narrow BoS, hypomobility, and bilateral decreased step  length. Pt ambulated to day room ~563fw/ RW and CGA. Agility ladder single step forward w/ RW CGA for balance x1 and single step forward w/o AD modA for balance x3. Side stepping to R and L in agility ladder x1 w/o AD and minA for balance. Pt ambulated ~357f/o AD modA for balance w/ multimodal cues to widen BoS and ~64f37f AD CGA back to room. Pt transferred stand<>sit in recliner w/ RW, seat belt alarm on, call bell on bed next to pt and all needs met.  Therapy Documentation Precautions:  Precautions Precautions: Fall Precaution Comments: L coordination deficit, mild trunk ataxia, Bil TED hose Restrictions Weight Bearing Restrictions: No General:   Balance Balance Assessed: Yes Standardized Balance Assessment Standardized Balance Assessment: Functional Gait Assessment Timed Up and Go Test TUG: Normal TUG Normal TUG (seconds): 60 Functional Gait  Assessment Gait assessed : Yes Gait Level Surface: Walks 20 ft, slow speed, abnormal gait pattern, evidence for imbalance or deviates 10-15 in outside of the 12 in walkway width. Requires more than 7 sec to ambulate 20 ft. Change in Gait Speed: Makes only minor adjustments to walking speed, or accomplishes a change in speed with significant gait deviations, deviates 10-15 in outside the 12 in walkway width, or changes speed but loses balance but is able to recover and continue walking. Gait with Horizontal Head Turns: Performs head turns smoothly with slight change in gait velocity (eg, minor disruption to smooth gait path), deviates 6-10 in outside 12 in walkway width, or uses an assistive device.  Gait with Vertical Head Turns: Performs task with moderate change in gait velocity, slows down, deviates 10-15 in outside 12 in walkway width but recovers, can continue to walk. Gait and Pivot Turn: Turns slowly, requires verbal cueing, or requires several small steps to catch balance following turn and stop Step Over Obstacle: Is able to step over  one shoe box (4.5 in total height) but must slow down and adjust steps to clear box safely. May require verbal cueing. Gait with Narrow Base of Support: Ambulates 7-9 steps. Gait with Eyes Closed: Walks 20 ft, slow speed, abnormal gait pattern, evidence for imbalance, deviates 10-15 in outside 12 in walkway width. Requires more than 9 sec to ambulate 20 ft. Ambulating Backwards: Cannot walk 20 ft without assistance, severe gait deviations or imbalance, deviates greater than 15 in outside 12 in walkway width or will not attempt task. Steps: Two feet to a stair, must use rail. Total Score: 11 FGA comment:: w/ RW  Therapy/Group: Individual Therapy  Lucile Shutters, SPT  08/06/2021, 8:00 AM

## 2021-08-06 NOTE — IPOC Note (Signed)
Overall Plan of Care [facility]) Patient Details Name: [name] MRN: [id number] DOB: 25-Aug-1960  Admitting Diagnosis: Acute ischemic left PCA stroke [facility])  Hospital Problems: Principal Problem:   Acute ischemic left PCA stroke [facility])     Functional Problem List: Nursing Bowel, Medication Management, Pain, Endurance, Safety  PT Balance, Edema, Endurance, Motor, Nutrition, Pain, Perception, Safety  OT Balance, Safety, Endurance, Motor, Cognition  SLP Cognition  TR         Basic ADLs: OT Grooming, Bathing, Dressing, Toileting, Eating     Advanced  ADLs: OT Simple Meal Preparation     Transfers: PT Bed Mobility, Bed to Chair, Car, Manufacturing systems engineer, Metallurgist: PT Ambulation, Emergency planning/management officer, Stairs     Additional Impairments: OT None  SLP Social Cognition   Problem Solving, Memory, Attention  TR      Anticipated Outcomes Item Anticipated Outcome  Self Feeding Independent  Swallowing      Basic self-care  Supervision level  Insurance underwriter Transfers Supervision  Bowel/Bladder  manage bowel with mod I  Transfers  Supervision/ Mod I  Locomotion  Supervision  Communication     Cognition  Mod I  Pain  pain at or below level 4 with prn meds  Safety/Judgment  maintain safety with cues/reminders   Therapy Plan: PT Intensity: Minimum of 1-2 x/day ,45 to 90 minutes PT Frequency: 5 out of 7 days PT Duration Estimated Length of Stay: 10 days OT Intensity: Minimum of 1-2 x/day, 45 to 90 minutes OT Frequency: 5 out of 7 days OT Duration/Estimated Length of Stay: 12-14 days SLP Intensity: Minumum of 1-2 x/day, 30 to 90 minutes SLP Frequency: 3 to 5 out of 7 days SLP Duration/Estimated Length of Stay: 12-14   Due to the current state of emergency, patients may not be receiving their 3-hours of Medicare-mandated therapy.   Team Interventions: Nursing Interventions Patient/Family Education, Bowel Management, Pain  Management, Discharge Planning, Medication Management, Disease Management/Prevention  PT interventions Ambulation/gait training, Community reintegration, DME/adaptive equipment instruction, Neuromuscular re-education, Psychosocial support, Stair training, UE/LE Strength taining/ROM, Wheelchair propulsion/positioning, UE/LE Coordination activities, Therapeutic Activities, Skin care/wound management, Pain management, Functional electrical stimulation, Discharge planning, Balance/vestibular training, Cognitive remediation/compensation, Disease management/prevention, Functional mobility training, Patient/family education, Splinting/orthotics, Therapeutic Exercise, Visual/perceptual remediation/compensation  OT Interventions Balance/vestibular training, Discharge planning, Self Care/advanced ADL retraining, Therapeutic Activities, UE/LE Coordination activities, Cognitive remediation/compensation, Functional mobility training, Patient/family education, Therapeutic Exercise, Pain management, DME/adaptive equipment instruction, Neuromuscular re-education, UE/LE Strength taining/ROM  SLP Interventions Cognitive remediation/compensation, Patient/family education, Therapeutic Exercise, Therapeutic Activities, Internal/external aids, Cueing hierarchy, Environmental controls  TR Interventions    SW/CM Interventions Discharge Planning, Psychosocial Support, Patient/Family Education   Barriers to Discharge MD  Medical stability  Nursing Decreased caregiver support 1 level apt with mother  PT Inaccessible home environment, Decreased caregiver support, Home environment access/layout, Lack of/limited family support, Insurance for SNF coverage, Nutrition means    OT      SLP Decreased caregiver support 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, no children but has family in [place]  SW Decreased caregiver support, Insurance underwriter for SNF coverage     Team Discharge Planning: Destination: PT-Home ,OT- Home , SLP-Home Projected  Follow-up: PT-Home health PT, Outpatient PT, 24 hour supervision/assistance, OT-  Home health OT, SLP- (tbd) Projected Equipment Needs: PT-To be determined, OT- To be determined, SLP-  Equipment Details: PT- , OT-  Patient/family involved in discharge planning: PT- Patient,  OT-Patient, SLP-Patient  MD  ELOS: 8-10 days Medical Rehab Prognosis:  Excellent Assessment: [name] is a 61 year old woman who is admitted to CIR with functional deficits secondary to 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 Medications are being managed, and labs and vitals are being monitored regularly.     See Team Conference Notes for weekly updates to the plan of care

## 2021-08-06 NOTE — Progress Notes (Signed)
Occupational Therapy Session Note  Patient Details  Name: [name] MRN: [id number] Date of Birth: July 06, 1961  Today's Date: 08/06/2021 OT Individual Time: 0915-1010 OT Individual Time Calculation (min): 55 min    Short Term Goals: Week 1:  OT Short Term Goal 1 (Week 1): Pt will complete LB bathing at min guard assist sit to stand for two consecutive sessions. OT Short Term Goal 2 (Week 1): Pt will complete LB dressing with min guard assist sit to stand for two consecutive sessions. OT Short Term Goal 3 (Week 1): Pt will complete toilet transfers with use of the LRAD and elevated toilet with min guard assist. OT Short Term Goal 4 (Week 1): Pt will complete tub/shower transfer with min guard assist using the RW and tub bench.  Skilled Therapeutic Interventions/Progress Updates:    Pt sitting up in recliner, no c/o pain, pleasant and motivated to participate with OT.  Pt reports she desires to go back to work as soon as she can.  Describes work duties as on her feet a lot and having to multitask, prioritize and organize pharmaceutical labels, and problem solve when errors occur.  Sit to stand and ambulation using RW of approximately 250 feet with one standing rest break needing CGA to close supervision.  Multimodal cues needed frequently to prevent LLE scissoring across midline.  Utilized visual target of tiles on floor to improve carryover.  Pt also needing verbal cues to keep RW at safe distance from body (pt pushing too far away).  Pt completed ambulation in front of mirror in ortho gym as well to provide visual feedback and assist with motor learning.  Noted improved carryover of BLE spacing during ambulation when using mirror.  Pt then participated in memory recall task standing at BITs with and then without RW with close supervision.  Pt achieved good recall and accuracy with word sequences of up to 5, however 6-7 words caused significant drop in recall (overall 65% for 4 minute task).  Pt  transported back to room and returned to recliner.  Call bell in reach, seat alarm on.   Therapy Documentation Precautions:  Precautions Precautions: Fall Precaution Comments: L coordination deficit, mild trunk ataxia, Bil TED hose Restrictions Weight Bearing Restrictions: No    Therapy/Group: Individual Therapy  Ezekiel Slocumb 08/06/2021, 1:31 PM

## 2021-08-06 NOTE — Plan of Care (Signed)
°  RD consulted for nutrition education regarding pre-diabetes. Pt reports wanting to eat better for her health. PTA she was eating 1 meal per day. She works 8P-7A so she typically sleeps during the day and will eat 1 large meal. This includes either a meal from [employer] or [employer]. She reports that during admission, she has been eating really well and is now feeling really hungry.   Lab Results  Component Value Date   HGBA1C 6.1 (H) 07/29/2021    RD provided "Plate Method" and "Heart Healthy Consistent Carbohydrate Nutrition Therapy" handout from the Academy of Nutrition and Dietetics. Discussed different food groups and their effects on blood sugar, emphasizing carbohydrate-containing foods. Provided list of carbohydrates and recommended serving sizes of common foods. We also discussed an overall healthy diet includes limited sodium, saturated fats and fiber containing foods. Spoke with pt about ways to enhance meals and make her fast food meals more balanced such as swapping french fries for a side salad or grilled chicken instead of fried, and asking for no salt on her fries.  Discussed importance of controlled and consistent carbohydrate intake throughout the day. Provided examples of ways to balance meals/snacks and encouraged intake of high-fiber, whole grain complex carbohydrates. Teach back method used.  Expect good compliance.  Body mass index is 27.03 kg/m. Pt meets criteria for overweight based on current BMI.  Current diet order is carb modified, patient is consuming approximately 85-100% of meals at this time. Labs and medications reviewed. No further nutrition interventions warranted at this time. RD contact information provided. If additional nutrition issues arise, please re-consult RD.  Clayborne Dana, RDN, LDN Clinical Nutrition

## 2021-08-07 LAB — GLUCOSE, CAPILLARY: Glucose-Capillary: 95 mg/dL (ref 70–99)

## 2021-08-07 MED ORDER — MAGNESIUM GLUCONATE 500 MG PO TABS
500.0000 mg | ORAL_TABLET | Freq: Every day | ORAL | Status: DC
  Administered 2021-08-07 – 2021-08-13 (×7): 500 mg via ORAL
  Filled 2021-08-07 (×7): qty 1

## 2021-08-07 MED ORDER — MIRABEGRON ER 25 MG PO TB24
25.0000 mg | ORAL_TABLET | Freq: Every day | ORAL | Status: DC
  Administered 2021-08-07: 12:00:00 25 mg via ORAL
  Filled 2021-08-07: qty 1

## 2021-08-07 NOTE — Progress Notes (Signed)
Patient is A&O x 4 and able to make her needs known. Patient sitting up in bed. Denies pain or discomfort at this time. Patient stated on Myrbetriq ER 25 mg qd. Patient educated on medication, and given printed handout on medication. Patient states that her night time frequency is due to her H2o intake however agreed to try the medication for a couple weeks to see if she urinated less. Patient also states that she worked [employer] for years and it is normal for her to be up at night going to the bathroom since she sleeps during day hours. Call light and personal items within reach, safety maintained.

## 2021-08-07 NOTE — Progress Notes (Signed)
Occupational Therapy Session Note  Patient Details  Name: [name] MRN: [id number] Date of Birth: [birth year]  Today's Date: 08/07/2021 OT Individual Time: 2876-8115 OT Individual Time Calculation (min): 55 min    Short Term Goals: Week 1:  OT Short Term Goal 1 (Week 1): Pt will complete LB bathing at min guard assist sit to stand for two consecutive sessions. OT Short Term Goal 2 (Week 1): Pt will complete LB dressing with min guard assist sit to stand for two consecutive sessions. OT Short Term Goal 3 (Week 1): Pt will complete toilet transfers with use of the LRAD and elevated toilet with min guard assist. OT Short Term Goal 4 (Week 1): Pt will complete tub/shower transfer with min guard assist using the RW and tub bench.  Skilled Therapeutic Interventions/Progress Updates:    Pt sitting up in recliner, reports soreness in neck, states she feels it might be from looking down at her feet during functional mobility.  Nurse made aware of pts pain level, location, and request for Tylenol. Pt provided treatment options and pt requesting to focus on kitchen level functional mobility and also tub transfer training.  Pt requested to use bathroom first.  Sit to stand and ambulation using RW with close supervision needing Vcs for safe RW mgt.  Pt completed toilet transfer and toileting with distant supervision.  Pt ambulated to sink and washed hands with close supervision.  Pt then ambulated in hallway approximately 225 feet to ADL suite with close supervision, again for RW mgt (pt pushing too far away from body).  Pt needed 2 standing rest breaks during this distance.  Educated pt on safe RW mgt in kitchen when approaching various surfaces including counter, stove, and refrigerator.  Pt retrieved item from lower cabinet and complained of dizziness.  Educated pt on rearranging items in kitchen so more frequently used items at trunk/shoulder height to reduce need for bending over.  Pt ambulated to tub  shower and educated on tub bench transfer.  Pt completed with close supervision and cues for hand placement.  Pt also completed step over transfer requiring CGA.  Pt reports she would prefer to use a bench at home to decrease level of assist needed.  Pt transported back to room via w/c and step pivot using RW to recliner with close supervision.  Call bell in reach, cold pack applied to neck, seat alarm on at end of session.  Therapy Documentation Precautions:  Precautions Precautions: Fall Precaution Comments: L coordination deficit, mild trunk ataxia, Bil TED hose Restrictions Weight Bearing Restrictions: No    Therapy/Group: Individual Therapy  Ezekiel Slocumb 08/07/2021, 3:03 PM

## 2021-08-07 NOTE — Progress Notes (Signed)
Speech Language Pathology Daily Session Note  Patient Details  Name: [name] MRN: [id number] Date of Birth: 09/23/1960  Today's Date: 08/07/2021 SLP Individual Time: 1345-1445 SLP Individual Time Calculation (min): 60 min  Short Term Goals: Week 1: SLP Short Term Goal 1 (Week 1): Pt will complete money/med management tasks with Supervision A verbal cues and extra time as needed SLP Short Term Goal 2 (Week 1): Pt will recall complex concepts/information with use of compensatory memory aid with Supervision A SLP Short Term Goal 3 (Week 1): Pt will complete moderately complex problem solving tasks with Supervision A and extra time as needed SLP Short Term Goal 4 (Week 1): Pt will participate in alternating attention task with Supervision A cues for accuracy  Skilled Therapeutic Interventions: Skilled ST treatment focused on cognitive goals. SLP facilitated session by providing min-to-mod A fading to sup-to-min A verbal cues for complex scheduling and deductive reasoning tasks. Pt required support for organizing and planning schedules, as well as prioritizing pertinent information. Pt completed today's tasks in a mildly distracting environment (TV on in background) and did not appear negatively impacted by the external stimuli. Provided patient with additional complex problem solving/deduction tasks for additional practice. Pt appreciative and very motivated to further address her "thinking." At end of session pt ambulated to-and-from bathroom and sink using RW with sup A cues. Pt performed toileting tasks and washed hands at sink independently. Patient was left in recliner with alarm activated and immediate needs within reach at end of session. Continue per current plan of care.      Pain Pain Assessment Pain Scale: 0-10 Pain Score: 0-No pain  Therapy/Group: Individual Therapy  Patty Sermons 08/07/2021, 2:08 PM

## 2021-08-07 NOTE — Progress Notes (Signed)
PROGRESS NOTE   Subjective/Complaints: Patient on phone currently Discussed with Kennyth Lose RN that she was a Copywriter, advertising for many years and so urinates at night instead of during they day.    ROS: +urinary frequency and urgency since prior to admission- she is a chronic night shift worker.    Objective:   No results found. Recent Labs    08/06/21 0632  WBC 6.8  HGB 14.6  HCT 43.4  PLT 228   Recent Labs    08/05/21 0458 08/06/21 0632  NA 138 139  K 4.3 4.4  CL 104 101  CO2 26 29  GLUCOSE 100* 95  BUN 13 13  CREATININE 0.81 0.87  CALCIUM 8.9 9.5    Intake/Output Summary (Last 24 hours) at 08/07/2021 1357 Last data filed at 08/07/2021 1300 Gross per 24 hour  Intake 956 ml  Output 120 ml  Net 836 ml        Physical Exam: Vital Signs Blood pressure 113/62, pulse 76, temperature 98 F (36.7 C), resp. rate 15, height 5\' 3"  (1.6 m), weight 69.2 kg, last menstrual period 07/15/2009, SpO2 99 %. Gen: no distress, normal appearing, BMI 27.03 HEENT: oral mucosa pink and moist, NCAT Cardio: Bradycardic Chest: normal effort, normal rate of breathing Abd: soft, non-distended Ext: no edema Psych: pleasant, normal affect Musculoskeletal:        General: No swelling. Normal range of motion.     Cervical back: Normal range of motion.     Comments: Mild tenderness over the dorsolateral aspect of left metatarsals  Skin:    General: Skin is warm and dry.     Comments: Long pink finger nails  Neurological:     Mental Status: She is alert.     Comments: Patient is alert and orientedx4.  No acute distress.  Makes eye contact with examiner.  Follows commands.  Provides name and age.  Fair insight and awareness. Alert and oriented x 3. Normal insight and awareness. Intact Memory. Normal language and speech. Cranial nerve exam unremarkable. Good sitting balance. Strength 4-5/5 in all 4's. Sensation intact to LT and pain in  all 4's. Decreased FTN, FMC RUE>LUE. DTR's 2+  Psychiatric:        Mood and Affect: Mood normal.        Behavior: Behavior normal.    Assessment/Plan: 1. Functional deficits which require 3+ hours per day of interdisciplinary therapy in a comprehensive inpatient rehab setting. Physiatrist is providing close team supervision and 24 hour management of active medical problems listed below. Physiatrist and rehab team continue to assess barriers to discharge/monitor patient progress toward functional and medical goals  Care Tool:  Bathing    Body parts bathed by patient: Right arm, Left arm, Chest, Abdomen, Front perineal area, Buttocks, Right upper leg, Left upper leg, Right lower leg, Left lower leg, Face         Bathing assist Assist Level: Minimal Assistance - Patient > 75%     Upper Body Dressing/Undressing Upper body dressing   What is the patient wearing?: Pull over shirt    Upper body assist Assist Level: Set up assist    Lower Body Dressing/Undressing Lower body  dressing      What is the patient wearing?: Underwear/pull up     Lower body assist Assist for lower body dressing: Contact Guard/Touching assist     Toileting Toileting    Toileting assist Assist for toileting: Minimal Assistance - Patient > 75%     Transfers Chair/bed transfer  Transfers assist     Chair/bed transfer assist level: Minimal Assistance - Patient > 75%     Locomotion Ambulation   Ambulation assist      Assist level: Minimal Assistance - Patient > 75% Assistive device: Walker-rolling Max distance: 200 ft   Walk 10 feet activity   Assist     Assist level: Minimal Assistance - Patient > 75% Assistive device: No Device   Walk 50 feet activity   Assist    Assist level: Minimal Assistance - Patient > 75% Assistive device: Walker-rolling    Walk 150 feet activity   Assist    Assist level: Minimal Assistance - Patient > 75% Assistive device: Walker-rolling     Walk 10 feet on uneven surface  activity   Assist Walk 10 feet on uneven surfaces activity did not occur: Safety/medical concerns         Wheelchair     Assist Is the patient using a wheelchair?: Yes Type of Wheelchair: Manual    Wheelchair assist level: Total Assistance - Patient < 25% Max wheelchair distance: 200 ft    Wheelchair 50 feet with 2 turns activity    Assist        Assist Level: Total Assistance - Patient < 25%   Wheelchair 150 feet activity     Assist      Assist Level: Total Assistance - Patient < 25%   Blood pressure 113/62, pulse 76, temperature 98 F (36.7 C), resp. rate 15, height 5\' 3"  (1.6 m), weight 69.2 kg, last menstrual period 07/15/2009, SpO2 99 %.  Medical Problem List and Plan: 1. 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  Antithrombotics: -DVT/anticoagulation:  Mechanical:  Antiembolism stockings, knee (TED hose) Bilateral lower extremities             -antiplatelet therapy: Aspirin 81 mg daily 3. Pain Management: Tylenol as needed 4. Mood: Provide emotional support             -antipsychotic agents: N/A 5. Neuropsych: This patient is capable of making decisions on her own behalf. 6. Skin/Wound Care: Routine skin checks 7. Fluids/Electrolytes/Nutrition: Routine in and outs with follow-up chemistries 8.  Cognitive deficits: continue SLP 9.  Prediabetes.  Hemoglobin A1c 6.1.  SSI discontinued. Provide list of foods to help improve insulin sensitivity.  10. Overweight: BMI 27.03: provide dietary education.  11. Hypernatremia: normalized 12. Bradycardia: continue to monitor TID 13. HLD: LDL 61. continue Crestor 20mg . Recommend Coenzyme q10. Discussed that  this can help mitigate the negative effects of the statin.  14. Vitamin D deficiency: start ergocalciferol 50,000U once per week for 7 weeks 15. Suboptimal magnesium levels: increase magnesium gluconate to 2\550mg  HS 16. Constipation: d/c miralax.  17. Urinary frequency at night: likely because she is a night shift worker, d/c myrbetriq  LOS: 4 days A FACE TO FACE EVALUATION WAS PERFORMED  Clide Deutscher Summerlynn Glauser 08/07/2021, 1:57 PM

## 2021-08-07 NOTE — Progress Notes (Signed)
Occupational Therapy Session Note  Patient Details  Name: [name] MRN: [id number] Date of Birth: 01-20-1961  Today's Date: 08/07/2021 OT Individual Time: 5537-4827 OT Individual Time Calculation (min): 25 min    Short Term Goals: Week 1:  OT Short Term Goal 1 (Week 1): Pt will complete LB bathing at min guard assist sit to stand for two consecutive sessions. OT Short Term Goal 2 (Week 1): Pt will complete LB dressing with min guard assist sit to stand for two consecutive sessions. OT Short Term Goal 3 (Week 1): Pt will complete toilet transfers with use of the LRAD and elevated toilet with min guard assist. OT Short Term Goal 4 (Week 1): Pt will complete tub/shower transfer with min guard assist using the RW and tub bench.  Skilled Therapeutic Interventions/Progress Updates:  Skilled OT intervention completed with focus on functional dynamic standing balance and BUE strengthening. Pt received seated EOB, agreeable to session. Pt requesting to complete oral hygiene, with pt sit > stand with supervision, then CGA short ambulatory transfer using RW to sink. Pt brushed teeth with CGA for balance with pt demonstrating compensatory lean on sink vs BLE stability with cues needed to widen BOS and rely on legs and one UE support on sink or RW vs leaning. Pt then seated EOB, participated in BUE strengthening using 3 pound dowel to promote endurance, motor planning and coordination in BUE needed for self-care tasks including the following:  Bicep flexion 2x10 Chest press 2x10 Shoulder flexion 2x10  Pt demonstrating delayed processing with slower movements however with good form once the exercise was instructed. Pt with decreased core control especially with bilateral overhead movements, with cues and education needed for stabilizing posture during exercises. Pt able to complete with min rest breaks. Pt left seated EOB, with bed alarm on and all needs in reach at end of session.   Therapy  Documentation Precautions:  Precautions Precautions: Fall Precaution Comments: L coordination deficit, mild trunk ataxia, Bil TED hose Restrictions Weight Bearing Restrictions: No  Pain: No c/o pain   Therapy/Group: Individual Therapy  Zylen Wenig E Lis Savitt 08/07/2021, 7:31 AM

## 2021-08-07 NOTE — Progress Notes (Signed)
Physical Therapy Session Note  Patient Details  Name: [name] MRN: [id number] Date of Birth: [birth year]  Today's Date: 08/07/2021 PT Individual Time: 0900-0958 PT Individual Time Calculation (min): 58 min   Short Term Goals: Week 1:  PT Short Term Goal 1 (Week 1): STG = LTG d/t ELOS  Skilled Therapeutic Interventions/Progress Updates:    Pt met sitting at EOB w/ LPN present for pt education. TED hose, socks, and shoes donned maxA for time with pt tieing shoes independently. Focus of session on L foot proprioception w/ pt demonstrating improvements throughout session w/ multimodal cues. Pt ambulated w/ RW CGA to bathroom and completed B&B peri-care w/ S. Sit<>stand from standard toliet w/ CGA and pt ambulated to 52M rehab gym w/ RW and CGA ~186f. Verbal cues provided for upright posture w/ RW. Alternating step up to targets on 8in step completed 1x10 bil w/ RW, 2x5 bil w/ HHA on L and minA for balance. 4 square step test completed x2 trials w HHA on L and VC for sequence and big steps (47s, 37s). Pt completed standing L colored marker tap w/ L HHA and QC trial x2 each w/ markers at 12, 11, 9, 7, and 6 oclock marks. LOB x2 to R when stepping backward towards 7 oclock due to increased trunk flexion to compensate hip ext weakness. QC gait trial ~149f~15ft completed w/ minA for balance and sequencing. Pt demonstrated difficulty w/ modified 3 point sequencing despite verbal cues and would recommend use of RW for ambulation w/ AD. Finished session w/ ~35 ft x3 ambulation w/ RW and CGA straddling green line to focus on keeping feet shoulder width apart during gait. Pt required multimodal cues during 1st trial and demonstrated carryover in remaining trials and continued w/ ambulation ~75103fack to room w/ RW and CGA. Pt transferred to recliner w/ CGA, seat belt on, call bell in hand, and all needs met. Pt stated slight neck pain due to looking forward during ambulation and will continue to monitor during  future sessions. Plan to incorporate quadriped and high kneeling positions to target hip strengthening during next session.  Therapy Documentation Precautions:  Precautions Precautions: Fall Precaution Comments: L coordination deficit, mild trunk ataxia, Bil TED hose Restrictions Weight Bearing Restrictions: No General:     Therapy/Group: Individual Therapy  ConLucile ShuttersPT  08/07/2021, 7:38 AM

## 2021-08-07 NOTE — Progress Notes (Signed)
Up 6-7 times this shift to void. Patient reports she had urinary frequency at home.[name] A

## 2021-08-07 NOTE — Progress Notes (Signed)
Patient ID: [name], female   DOB: [birth year], 61 y.o.   MRN: [id number] Met with the patient to introduce self, role, team conference and plan of care. Discussed secondary risks including HTN, HLD and prediabetes along with smoking cessation. Patient noted she was not interested in smoking since event and understood dietary modification recommendations and rationale for changes. Continue to follow along to discharge to address educational needs and facilitate preparation for discharge. Margarito Liner

## 2021-08-08 LAB — GLUCOSE, CAPILLARY
Glucose-Capillary: 98 mg/dL (ref 70–99)
Glucose-Capillary: 101 mg/dL — ABNORMAL HIGH (ref 70–99)

## 2021-08-08 MED ORDER — SENNOSIDES-DOCUSATE SODIUM 8.6-50 MG PO TABS
1.0000 | ORAL_TABLET | Freq: Every day | ORAL | Status: DC
  Administered 2021-08-09 – 2021-08-13 (×5): 1 via ORAL
  Filled 2021-08-08 (×5): qty 1

## 2021-08-08 NOTE — Patient Care Conference (Cosign Needed)
Inpatient RehabilitationTeam Conference and Plan of Care Update Date: 08/08/2021   Time: 12:50 PM    Patient Name: [name]      Medical Record Number: [id number]  Date of Birth: 1961-03-16 Sex: Female         Room/Bed: 4W07C/4W07C-01 Payor Info: Payor: Theme park manager / Plan: [place] / Product Type: *No Product type* /    Admit Date/Time:  08/03/2021  2:35 PM  Primary Diagnosis:  Acute ischemic left PCA stroke [facility])  Hospital Problems: Principal Problem:   Acute ischemic left PCA stroke [facility])    Expected Discharge Date: Expected Discharge Date: 08/15/21  Team Members Present: Physician leading conference: Dr. Leeroy Cha Social Worker Present: Ovidio Kin, LCSW Nurse Present: Other (comment) Benjie Karvonen) PT Present: Ginnie Smart, PT OT Present: Leretha Pol, OT SLP Present: Charolett Bumpers, SLP PPS Coordinator present : Gunnar Fusi, SLP     Current Status/Progress Goal Weekly Team Focus  Bowel/Bladder   [facility] of Bowel and Bladder. [facility] 08/07/21  Maintain Bowel Function.  Monitor B/B function.   Swallow/Nutrition/ Hydration             ADL's   CGA-supervision; primary barriers include dizziness/unsteady balance, motor/weakness LLE (scissors during gait and functional transfers), and neck pain.  mod I  adl/iadl training, functional transfer training, cognitive therapeutic activity and dual task, neuro re-ed LLE, pt education, dc planning   Mobility   bed mobility S, bed<>chair S w/ RW, ambulation 140ft w/ RW CGA, stairs CGA  S to modI overall  step length and width, hypomobility, LLE proprioception, safety awareness, postural awareness and control, D/C planning   Communication             Safety/Cognition/ Behavioral Observations  min-to-mod higher level cognition  mod I  attention, memory, problem solving, meds/money   Pain   Denies Pain,  Remain pain free.  Monitor pain status Qshift and prn.   Skin   Grossly Intact.   Maintain skin integrity.        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 Pt hoping to get mod/i by DC   Team Discussion: Urinary frequency at night not bothersome to patient. Received education on blood sugar management, continue to reinforce. 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 Patient eager to return to work. Some disorganization with simple tasks noted.  Patient on target to meet rehab goals: yes  *See Care Plan and progress notes for long and short-term goals.   Revisions to Treatment Plan:  None  Teaching Needs: Smoking cessation, Secondary stroke prevention, diet, pre-diabetes, medication management  Current Barriers to Discharge: Decreased caregiver support  Possible Resolutions to Barriers: Social work to provide list of private duty agencies to provide care if needed.     Medical Summary Current Status: urinary frequency, elevated LDL and total cholesterol, impaired cognition, soft BP, overweight, hyperglycemia  Barriers to Discharge: Behavior;Weight;Medical stability  Barriers to Discharge Comments: urinary frequency, elevated LDL and total cholesterol, impaired cognition, soft BP, overweight, hyperglycemia Possible Resolutions to [employer] Focus: discussed myrbetriq but she has been a night shift worker for years and nocturia does not bother her, discussed new statin, potential side effects, use of Coenzyme Q10 to mitigate these side effects   Continued Need for Acute Rehabilitation Level of Care: The patient requires daily medical management by a physician with specialized training in physical medicine and rehabilitation for the following reasons:  Direction of a multidisciplinary physical rehabilitation program to maximize functional independence : Yes Medical management of patient stability for increased activity during participation in an intensive rehabilitation regime.:  Yes Analysis of laboratory values and/or radiology reports with any subsequent need for medication adjustment and/or medical intervention. : Yes   I attest that I was present, lead the team conference, and concur with the assessment and plan of the team.   Barrett Shell 08/08/2021, 12:50 PM

## 2021-08-08 NOTE — Progress Notes (Signed)
Speech Language Pathology Daily Session Note  Patient Details  Name: [name] MRN: [id number] Date of Birth: 1960-09-28  Today's Date: 08/08/2021 SLP Individual Time: 0821-0905 SLP Individual Time Calculation (min): 44 min  Short Term Goals: Week 1: SLP Short Term Goal 1 (Week 1): Pt will complete money/med management tasks with Supervision A verbal cues and extra time as needed SLP Short Term Goal 2 (Week 1): Pt will recall complex concepts/information with use of compensatory memory aid with Supervision A SLP Short Term Goal 3 (Week 1): Pt will complete moderately complex problem solving tasks with Supervision A and extra time as needed SLP Short Term Goal 4 (Week 1): Pt will participate in alternating attention task with Supervision A cues for accuracy  Skilled Therapeutic Interventions:Skilled ST services focused on cognitive skills. Pt presented scheduling task completed with SLP yesterday as well as homework scheduling task that she was unable to complete due to fatigue. SLP facilitated complex problem solving, thought organization and working memory in scheduling tasks, pt required mod A fade to min A verbal cues. Pt received a phone from her insurance company at the end of the session, pt appeared to demonstrate appropriate recall during conversation with x1 delayed recall of doctors office name later in the conversation. Pt was left in room with call bell within reach and recommend to continue skilled ST services.      Pain Pain Assessment Pain Score: 0-No pain  Therapy/Group: Individual Therapy  Saanvi Hakala  [facility] 08/08/2021, 11:13 AM

## 2021-08-08 NOTE — Progress Notes (Signed)
Physical Therapy Session Note  Patient Details  Name: [name] MRN: [id number] Date of Birth: 1961-02-12  Today's Date: 08/08/2021 PT Individual Time: 1000-1055 PT Individual Time Calculation (min): 55 min   Short Term Goals: Week 1:  PT Short Term Goal 1 (Week 1): STG = LTG d/t ELOS  Skilled Therapeutic Interventions/Progress Updates:    Patient received sitting edge of bed, handoff from OT. Agreeable to PT. She denies pain. Patient donning sneakers with supervision. Ambulating to therapy gym with RW and CGA. WBOS, trendelenburg, excessive pelvic rotation B and heavy reliance on RW noted. Patient ambulating 22ft with no AD and MinA with increased gait deviations as noted above. Patient ambulating 56ft with RW again with increased emphasis on upright posture, decreased reliance on RW. Seated random perturbations with emphasis on increasing core control/stability. Patient completing 3x30s thomas stretch to B hip flexors to assist with forward flexed posture and pelvic rotation during gait. Quadruped anterior/lateral reaching with U UE- noted general trunk instability when reaching L >R. Patient returning to room in wc, ambulatory transfer to recliner. Seatbelt alarm on, call light within reach.   Therapy Documentation Precautions:  Precautions Precautions: Fall Precaution Comments: L coordination deficit, mild trunk ataxia, Bil TED hose Restrictions Weight Bearing Restrictions: No     Therapy/Group: Individual Therapy  Karoline Caldwell, PT, DPT, CBIS  08/08/2021, 7:44 AM

## 2021-08-08 NOTE — Progress Notes (Signed)
Occupational Therapy Session Note  Patient Details  Name: [name] MRN: [id number] Date of Birth: 12/23/1960  Today's Date: 08/08/2021 OT Individual Time: 0905-1000 OT Individual Time Calculation (min): 55 min    Short Term Goals: Week 1:  OT Short Term Goal 1 (Week 1): Pt will complete LB bathing at min guard assist sit to stand for two consecutive sessions. OT Short Term Goal 2 (Week 1): Pt will complete LB dressing with min guard assist sit to stand for two consecutive sessions. OT Short Term Goal 3 (Week 1): Pt will complete toilet transfers with use of the LRAD and elevated toilet with min guard assist. OT Short Term Goal 4 (Week 1): Pt will complete tub/shower transfer with min guard assist using the RW and tub bench.  Skilled Therapeutic Interventions/Progress Updates:    Pt sitting up in recliner, no c/o pain, eager to take a shower. Also requesting to use bathroom prior to showering.  CGA sit to stand and ambulated approx 10 feet to bathroom without AD and hand held assist.  Toilet transfer and toileting completed with distant supervision using grab bars and standard toilet.  Pt ambulated 3 feet to shower and stating "I dont know how to do this".  Pt verbally re-educated on shower bench transfer and pt return demo'd with CGA.  Pt bathed UB and LB at sit<>stand level using grab bar intermittently with stand by assist due to pt dropped soap and wash cloth and required assist to retrieve.  Pt ambulated to EOB and paused requiring extended time to decide what to do next.  Pt ultimately needed a question cue to assist in problem solving and organizing morning routine.  Pt retrieved items for UB and LB dressing and completed with CGA (retrieved one item at a time then returning to EOB and repeated this with each item).  Pt then stood sinkside and brushed teeth with close supervision.  Direct hand off to PT.  Therapy Documentation Precautions:  Precautions Precautions: Fall Precaution  Comments: L coordination deficit, mild trunk ataxia, Bil TED hose Restrictions Weight Bearing Restrictions: No   Therapy/Group: Individual Therapy  Ezekiel Slocumb 08/08/2021, 1:19 PM

## 2021-08-08 NOTE — Progress Notes (Signed)
PROGRESS NOTE   Subjective/Complaints: No new complaints Team conference today Discussed improved CBG control She had meeting with diabetes coordinator as well  ROS: +urinary frequency and urgency since prior to admission- she is a chronic night shift worker. Denies pain   Objective:   No results found. Recent Labs    08/06/21 0632  WBC 6.8  HGB 14.6  HCT 43.4  PLT 228   Recent Labs    08/06/21 0632  NA 139  K 4.4  CL 101  CO2 29  GLUCOSE 95  BUN 13  CREATININE 0.87  CALCIUM 9.5    Intake/Output Summary (Last 24 hours) at 08/08/2021 1225 Last data filed at 08/08/2021 0700 Gross per 24 hour  Intake 360 ml  Output --  Net 360 ml        Physical Exam: Vital Signs Blood pressure (!) 123/57, pulse 65, temperature 97.8 F (36.6 C), temperature source Oral, resp. rate 16, height 5\' 3"  (1.6 m), weight 69.2 kg, last menstrual period 07/15/2009, SpO2 100 %. Gen: no distress, normal appearing, BMI 27.03 HEENT: oral mucosa pink and moist, NCAT Cardio: Bradycardic Chest: normal effort, normal rate of breathing Abd: soft, non-distended Ext: no edema Psych: pleasant, normal affect Musculoskeletal:        General: No swelling. Normal range of motion.     Cervical back: Normal range of motion.     Comments: Mild tenderness over the dorsolateral aspect of left metatarsals  Skin:    General: Skin is warm and dry.     Comments: Long pink finger nails  Neurological:     Mental Status: She is alert.     Comments: Patient is alert and orientedx4.  No acute distress.  Makes eye contact with examiner.  Follows commands.  Provides name and age.  Fair insight and awareness. Alert and oriented x 3. Normal insight and awareness. Intact Memory. Impaired problem solving. Normal language and speech. Cranial nerve exam unremarkable. Good sitting balance. Strength 4-5/5 in all 4's. Sensation intact to LT and pain in all 4's.  Decreased FTN, FMC RUE>LUE. DTR's 2+  Psychiatric:        Mood and Affect: Mood normal.        Behavior: Behavior normal.    Assessment/Plan: 1. Functional deficits which require 3+ hours per day of interdisciplinary therapy in a comprehensive inpatient rehab setting. Physiatrist is providing close team supervision and 24 hour management of active medical problems listed below. Physiatrist and rehab team continue to assess barriers to discharge/monitor patient progress toward functional and medical goals  Care Tool:  Bathing    Body parts bathed by patient: Right arm, Left arm, Chest, Abdomen, Front perineal area, Buttocks, Right upper leg, Left upper leg, Right lower leg, Left lower leg, Face         Bathing assist Assist Level: Minimal Assistance - Patient > 75%     Upper Body Dressing/Undressing Upper body dressing   What is the patient wearing?: Pull over shirt    Upper body assist Assist Level: Set up assist    Lower Body Dressing/Undressing Lower body dressing      What is the patient wearing?: Underwear/pull up  Lower body assist Assist for lower body dressing: Contact Guard/Touching assist     Toileting Toileting    Toileting assist Assist for toileting: Minimal Assistance - Patient > 75%     Transfers Chair/bed transfer  Transfers assist     Chair/bed transfer assist level: Minimal Assistance - Patient > 75%     Locomotion Ambulation   Ambulation assist      Assist level: Minimal Assistance - Patient > 75% Assistive device: Walker-rolling Max distance: 200 ft   Walk 10 feet activity   Assist     Assist level: Minimal Assistance - Patient > 75% Assistive device: No Device   Walk 50 feet activity   Assist    Assist level: Minimal Assistance - Patient > 75% Assistive device: Walker-rolling    Walk 150 feet activity   Assist    Assist level: Minimal Assistance - Patient > 75% Assistive device: Walker-rolling    Walk  10 feet on uneven surface  activity   Assist Walk 10 feet on uneven surfaces activity did not occur: Safety/medical concerns         Wheelchair     Assist Is the patient using a wheelchair?: Yes Type of Wheelchair: Manual    Wheelchair assist level: Total Assistance - Patient < 25% Max wheelchair distance: 200 ft    Wheelchair 50 feet with 2 turns activity    Assist        Assist Level: Total Assistance - Patient < 25%   Wheelchair 150 feet activity     Assist      Assist Level: Total Assistance - Patient < 25%   Blood pressure (!) 123/57, pulse 65, temperature 97.8 F (36.6 C), temperature source Oral, resp. rate 16, height 5\' 3"  (1.6 m), weight 69.2 kg, last menstrual period 07/15/2009, SpO2 100 %.  Medical Problem List and Plan: 1. 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  Antithrombotics: -DVT/anticoagulation:  Mechanical:  Antiembolism stockings, knee (TED hose) Bilateral lower extremities             -antiplatelet therapy: Aspirin 81 mg daily 3. Pain Management: Tylenol as needed 4. Mood: Provide emotional support             -antipsychotic agents: N/A 5. Neuropsych: This patient is capable of making decisions on her own behalf. 6. Skin/Wound Care: Routine skin checks 7. Fluids/Electrolytes/Nutrition: Routine in and outs with follow-up chemistries 8.  Cognitive deficits: continue SLP 9.  Prediabetes.  Hemoglobin A1c 6.1.  SSI discontinued. Provide list of foods to help improve insulin sensitivity. Commended on improvements in CBGs! 10. Overweight: BMI 27.03: provide dietary education.  11. Hypernatremia: normalized 12. Bradycardia: continue to monitor TID 13. HLD: LDL 61. continue  Crestor 20mg . Recommend Coenzyme q10. Discussed that this can help mitigate the negative effects of the statin.  14. Vitamin D deficiency: start ergocalciferol 50,000U once per week for 7 weeks 15. Suboptimal magnesium levels: increase magnesium gluconate to 500 mg HS 16. Constipation: Type 4 stool 1/25. d/c miralax. Decrease senna-docusate to daily.  17. Urinary frequency at night: likely because she is a night shift worker, d/c myrbetriq. Discussed with team  and nursing.     LOS: 5 days A FACE TO FACE EVALUATION WAS PERFORMED  [name] 08/08/2021, 12:25 PM

## 2021-08-08 NOTE — Progress Notes (Signed)
Physical Therapy Session Note  Patient Details  Name: [name] MRN: [id number] Date of Birth: 03-May-1961  Today's Date: 08/08/2021 PT Individual Time: 1500-1530 PT Individual Time Calculation (min): 30 min   Short Term Goals: Week 1:  PT Short Term Goal 1 (Week 1): STG = LTG d/t ELOS  Skilled Therapeutic Interventions/Progress Updates:    Pt met sitting in recliner and ready for PT. Focus of session on bed mobility and ambulation endurance. No reported neck pain during session and pt stated ice packs help to relieve onset of neck pain when it occurs. 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 Pt stated "mom is independent but does not drive". Sit<>stand and ambulated to ADL apartment ~245f w/ RW and CGA. Pt completed bed mobility w/ S, sit<>stand from couch and recliner w/ RW and CGA. VC required to push up from couch and reach back for seat on furniture. Discussed prior work requirements as pt wants to get back to work post d/c. 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 Pt stated she works Thurs-Sun 7:45am-6pm. Plan to discuss CVA recovery timeline and anticipated gradual return to work activities. Finished session w/ ~1753f+~7544fmbulation w/ RW CGA w/ seated rest break after 175f29ft demonstrated improved LLE proprioception by maintaining appropriate step width and length throughout session w/ 1 VC reminder at start of session. Pt ambulated to room and completed peri-care w/ S. Pt ambulated to recliner, seat belt alarm on, call bell in hand, and all needs met. Plan to complete 6MWT next session w/ focus on ambulation endurance and dual tasks as appropriate.  Therapy Documentation Precautions:  Precautions Precautions: Fall Precaution Comments: L coordination deficit, mild trunk ataxia, Bil TED hose Restrictions Weight Bearing Restrictions:  No General:     Therapy/Group: Individual Therapy  ConnLucile ShuttersT  08/08/2021, 12:43 PM

## 2021-08-08 NOTE — Progress Notes (Signed)
Patient ID: [name], female   DOB: 17-Apr-1961, 61 y.o.   MRN: [id number]  Met with pt to discuss team conference goals of supervision-mod/I level and target discharge date of 2/1. Pt is pleased with her progress thus far and is hopeful she will continue with it. She is aware this worker has [employer] disability forms for MD to complete-had pt sign her part. Also discussed OP therapies and transportation form to be able to get to the center. Pt is hopeful to eventually get back to work since she really likes what she does. Will work on discharge plans.

## 2021-08-08 NOTE — Evaluation (Signed)
Recreational Therapy Assessment and Plan  Patient Details  Name: [name] MRN: [id number] Date of Birth: 03/09/1961 Today's Date: 08/08/2021  Rehab Potential:  Good ELOS:   d/c 2/1  Assessment  Hospital Problem: Principal Problem:   Acute ischemic left PCA stroke [facility])     Past Medical History:      Past Medical History:  Diagnosis Date   Blood in stool     Hyperlipidemia     Vaginal fibroids      Past Surgical History: History reviewed. No pertinent surgical history.   Assessment & Plan Clinical Impression: Patient is a 61 y.o. year old female with recent admission to the hospital on [name] is a 61 year old right-handed female with history of hyperlipidemia as well as prediabetes, tobacco use.  Patient on no prescription medications.  Per chart review patient lives with her mother.  1 level apartment.  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  Presented 07/28/2021 with dizziness, unsteady gait and bouts of vomiting.  She had initially been seen at the urgent care 07/28/2021 for headache nausea vomiting and low-grade fever without relief with BC powder.  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  Patient transferred to CIR on 08/03/2021 .     Pt presents with decreased activity tolerance, decreased functional mobility, decreased balance,  decreased coordination, decreased memory, feelings of stress Limiting pt's independence with leisure/community pursuits.  Met with pt today to discuss purpose of TR services, provide education on social, emotional, cognitive and spiritual health in addition to the physical and their impact on each other and overall health & wellness.   Plan  Min 1 TR session during LOS >30 minutes  Recommendations for other services: None   Discharge Criteria: Patient will be discharged from TR if patient refuses treatment 3 consecutive times without medical reason.  If treatment goals not met, if there is a change in  medical status, if patient makes no progress towards goals or if patient is discharged from hospital.  The above assessment, treatment plan, treatment alternatives and goals were discussed and mutually agreed upon: by patient  [name] 08/08/2021, 4:12 PM

## 2021-08-09 LAB — GLUCOSE, CAPILLARY
Glucose-Capillary: 101 mg/dL — ABNORMAL HIGH (ref 70–99)
Glucose-Capillary: 78 mg/dL (ref 70–99)
Glucose-Capillary: 124 mg/dL — ABNORMAL HIGH (ref 70–99)
Glucose-Capillary: 94 mg/dL (ref 70–99)

## 2021-08-09 MED ORDER — MECLIZINE HCL 25 MG PO TABS: 12.5000 mg | ORAL_TABLET | Freq: Two times a day (BID) | ORAL | Status: DC | PRN

## 2021-08-09 NOTE — Progress Notes (Signed)
Occupational Therapy Session Note  Patient Details  Name: [name] MRN: [id number] Date of Birth: [birth year]  Today's Date: 08/09/2021 OT Individual Time: 1330-1430 OT Individual Time Calculation (min): 60 min    Short Term Goals: Week 1:  OT Short Term Goal 1 (Week 1): Pt will complete LB bathing at min guard assist sit to stand for two consecutive sessions. OT Short Term Goal 2 (Week 1): Pt will complete LB dressing with min guard assist sit to stand for two consecutive sessions. OT Short Term Goal 3 (Week 1): Pt will complete toilet transfers with use of the LRAD and elevated toilet with min guard assist. OT Short Term Goal 4 (Week 1): Pt will complete tub/shower transfer with min guard assist using the RW and tub bench.  Skilled Therapeutic Interventions/Progress Updates:    Pt sitting up in recliner, no c/o pain, eager to work harder in therapy sessions.  Pt also mentioning wanting to get back to work as soon as possible.  Collaborated with pt regarding details of job performance demands and determined she will need higher level multitasking, organization, decision making, time management, and problem solving skills as well as ability to dual task and walk long distances without fatigue.  Pt requesting to use bathroom before leaving room. Sit to stand and ambulation using RW to bathroom with supervision.  Distant supervision to complete toilet transfer and toileting (continent of bladder).  Pt ambulated approximately 100 feet to dayroom using RW with supervision.    Pt participated in dual task picture label organization with time component to encourage increased processing speed and increased gait speed.  Pt able to correctly categorize 8/10 food items into Dairy/meat/vegetable/fruit without any cueing and required min question cues to categorize 2/10 pictures. Pt required 5 minutes and 15 seconds to complete all 10 items and placed item approximately 10 feet away from retrieval  location.  Pt then participated in jigsaw puzzle with visual scanning and instant recall component again while being timed to facilitate faster processing. Pt able to complete each color sequence 3x5 in 2-3 minutes.  Pt returned to first dual task with added categories of seasonings and junk food.  This time pt required increased cueing for 4/10 items to accurately categorize and correct errors, but she did complete in faster time of 4 minutes and 30 seconds. Pt ambulated back to room with supervision and stand to sit distant sup, call bell in reach, seat alarm on. Improved steadiness noted during functional mobility using RW.   Therapy Documentation Precautions:  Precautions Precautions: Fall Precaution Comments: L coordination deficit, mild trunk ataxia, Bil TED hose Restrictions Weight Bearing Restrictions: No    Therapy/Group: Individual Therapy  Ezekiel Slocumb 08/09/2021, 5:57 PM

## 2021-08-09 NOTE — Progress Notes (Signed)
PROGRESS NOTE   Subjective/Complaints: Complaints of dizziness. Discussed adding prn meclizine for her and encouraged staying well hydrated by drinking 6-8 glasses of water per day She has no other compaints  ROS: +urinary frequency and urgency since prior to admission- she is a chronic night shift worker. Denies pain   Objective:   No results found. No results for input(s): WBC, HGB, HCT, PLT in the last 72 hours.  No results for input(s): NA, K, CL, CO2, GLUCOSE, BUN, CREATININE, CALCIUM in the last 72 hours.   Intake/Output Summary (Last 24 hours) at 08/09/2021 1855 Last data filed at 08/09/2021 1406 Gross per 24 hour  Intake 840 ml  Output --  Net 840 ml        Physical Exam: Vital Signs Blood pressure 114/64, pulse 77, temperature (!) 97.5 F (36.4 C), resp. rate 17, height 5\' 3"  (1.6 m), weight 69.2 kg, last menstrual period 07/15/2009, SpO2 100 %. Gen: no distress, normal appearing, BMI 27.03 HEENT: oral mucosa pink and moist, NCAT Cardio: Bradycardic Chest: normal effort, normal rate of breathing Abd: soft, non-distended Ext: no edema Psych: pleasant, normal affect Musculoskeletal:        General: No swelling. Normal range of motion.     Cervical back: Normal range of motion.     Comments: Mild tenderness over the dorsolateral aspect of left metatarsals  Transferring CG with RW Skin:    General: Skin is warm and dry.     Comments: Long pink finger nails  Neurological:     Mental Status: She is alert.     Comments: Patient is alert and orientedx4.  No acute distress.  Makes eye contact with examiner.  Follows commands.  Provides name and age.  Fair insight and awareness. Alert and oriented x 3. Normal insight and awareness. Intact Memory. Impaired problem solving. Normal language and speech. Cranial nerve exam unremarkable. Good sitting balance. Strength 4-5/5 in all 4's. Sensation intact to LT and pain in  all 4's. Decreased FTN, FMC RUE>LUE. DTR's 2+  Psychiatric:        Mood and Affect: Mood normal.        Behavior: Behavior normal.    Assessment/Plan: 1. Functional deficits which require 3+ hours per day of interdisciplinary therapy in a comprehensive inpatient rehab setting. Physiatrist is providing close team supervision and 24 hour management of active medical problems listed below. Physiatrist and rehab team continue to assess barriers to discharge/monitor patient progress toward functional and medical goals  Care Tool:  Bathing    Body parts bathed by patient: Right arm, Left arm, Chest, Abdomen, Front perineal area, Buttocks, Right upper leg, Left upper leg, Right lower leg, Left lower leg, Face         Bathing assist Assist Level: Minimal Assistance - Patient > 75%     Upper Body Dressing/Undressing Upper body dressing   What is the patient wearing?: Pull over shirt    Upper body assist Assist Level: Set up assist    Lower Body Dressing/Undressing Lower body dressing      What is the patient wearing?: Underwear/pull up     Lower body assist Assist for lower body dressing: Contact  Guard/Touching assist     Chartered loss adjuster assist Assist for toileting: Minimal Assistance - Patient > 75%     Transfers Chair/bed transfer  Transfers assist     Chair/bed transfer assist level: Minimal Assistance - Patient > 75%     Locomotion Ambulation   Ambulation assist      Assist level: Minimal Assistance - Patient > 75% Assistive device: Walker-rolling Max distance: 200 ft   Walk 10 feet activity   Assist     Assist level: Minimal Assistance - Patient > 75% Assistive device: No Device   Walk 50 feet activity   Assist    Assist level: Minimal Assistance - Patient > 75% Assistive device: Walker-rolling    Walk 150 feet activity   Assist    Assist level: Minimal Assistance - Patient > 75% Assistive device: Walker-rolling     Walk 10 feet on uneven surface  activity   Assist Walk 10 feet on uneven surfaces activity did not occur: Safety/medical concerns         Wheelchair     Assist Is the patient using a wheelchair?: Yes Type of Wheelchair: Manual    Wheelchair assist level: Total Assistance - Patient < 25% Max wheelchair distance: 200 ft    Wheelchair 50 feet with 2 turns activity    Assist        Assist Level: Total Assistance - Patient < 25%   Wheelchair 150 feet activity     Assist      Assist Level: Total Assistance - Patient < 25%   Blood pressure 114/64, pulse 77, temperature (!) 97.5 F (36.4 C), resp. rate 17, height 5\' 3"  (1.6 m), weight 69.2 kg, last menstrual period 07/15/2009, SpO2 100 %.  Medical Problem List and Plan: 1. 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  Antithrombotics: -DVT/anticoagulation:  Mechanical:  Antiembolism stockings, knee (TED hose) Bilateral lower extremities             -antiplatelet therapy: Aspirin 81 mg daily 3. Pain Management: Tylenol as needed 4. Mood: Provide emotional support             -antipsychotic agents: N/A 5. Neuropsych: This patient is capable of making decisions on her own behalf. 6. Skin/Wound Care: Routine skin checks 7. Fluids/Electrolytes/Nutrition: Routine in and outs with follow-up chemistries 8.  Cognitive deficits: continue SLP 9.  Prediabetes.  Hemoglobin A1c 6.1.  SSI discontinued. Provide list of foods to help improve insulin sensitivity. Commended on improvements in CBGs! Continue carb modified diet.  10. Overweight: BMI 27.03: provide dietary education.  11. Hypernatremia: normalized 12. Bradycardia: continue to monitor TID 13. HLD: LDL  61. continue Crestor 20mg . Recommend Coenzyme q10. Discussed that this can help mitigate the negative effects of the statin.  14. Vitamin D deficiency: start ergocalciferol 50,000U once per week for 7 weeks 15. Suboptimal magnesium levels: continue magnesium gluconate to 500 mg HS 16. Constipation: Type 4 stool 1/25. d/c miralax. Decrease senna-docusate to daily.  17. Urinary frequency at night: likely because she is a night shift worker, d/c myrbetriq. Discussed with team and nursing.  18. Dizziness: add prn  meclizine.     LOS: 6 days A FACE TO FACE EVALUATION WAS PERFORMED  Izora Ribas 08/09/2021, 6:55 PM

## 2021-08-09 NOTE — Progress Notes (Signed)
Physical Therapy Session Note  Patient Details  Name: [name] MRN: [id number] Date of Birth: [birth year]  Today's Date: 08/09/2021 PT Individual Time: 3888-2800 PT Individual Time Calculation (min): 30 min   Short Term Goals: Week 1:  PT Short Term Goal 1 (Week 1): STG = LTG d/t ELOS  Skilled Therapeutic Interventions/Progress Updates:     6MWT - 425f w/ standing rest break x2 for 15s. 4765ftotal ambulation CVA endurance recovery timeline and return to work expectations, short term disability  Therapy Documentation Precautions:  Precautions Precautions: Fall Precaution Comments: L coordination deficit, mild trunk ataxia, Bil TED hose Restrictions Weight Bearing Restrictions: No General:  Pt met lying in bed and agreeable to PT. Pt requested to use bathroom and to change clothes prior to session. Pt ambulated CGA to bathroom and completed peri-care w/ S. Pt donned bra, shirt, and pants w/ S. TED stockings and shoes donned maxA for time w/ pt tieing shoes independently. 6MWT initiated from pt room and pt ambulated w/ RW CGA 40077f/ 2 standing 15 sec rest breaks. Finished session by ambulating [id number] RW CGA back to room and transferred to recliner. Pt educated on recovery timeline for CVA and realistic return to work criteria. Pt voiced understanding and stated she has filed out short term disability paperwork. Pt left sitting in chair, seat belt alarm on, call bell in hands, and all needs met.  Therapy/Group: Individual Therapy  ConnLucile ShuttersT  08/09/2021, 7:53 AM

## 2021-08-09 NOTE — Progress Notes (Signed)
Speech Language Pathology Daily Session Note  Patient Details  Name: KRYSTENA REITTER MRN: [id number] Date of Birth: 1961-07-14  Today's Date: 08/09/2021 SLP Individual Time: 1016-1100 SLP Individual Time Calculation (min): 44 min  Short Term Goals: Week 1: SLP Short Term Goal 1 (Week 1): Pt will complete money/med management tasks with Supervision A verbal cues and extra time as needed SLP Short Term Goal 2 (Week 1): Pt will recall complex concepts/information with use of compensatory memory aid with Supervision A SLP Short Term Goal 3 (Week 1): Pt will complete moderately complex problem solving tasks with Supervision A and extra time as needed SLP Short Term Goal 4 (Week 1): Pt will participate in alternating attention task with Supervision A cues for accuracy  Skilled Therapeutic Interventions: Pt seen for skilled ST with focus on cognitive goals, pt up in recliner and agreeable to therapeutic tasks. Pt showing SLP scheduling task she completed independently, reports it being quite difficult and unsure how accurate it is. SLP did notice some errors, provided blank paper to complete with assistance. Pt benefiting from overall min A verbal cues to complete accuracy and for error ID/repair. SLP facilitating alternating attention activity by providing min A verbal cues, some mod A cues toward end due to cognitive fatigue. Pt continues to endorse word finding deficits during conversation (not noted during session), SLP providing strategies to increase word finding and reduce frustration. Pt left in recliner with alarm belt set and all needs within reach. Cont ST POC.      Pain Pain Assessment Pain Scale: 0-10 Pain Score: 0-No pain  Therapy/Group: Individual Therapy  Dewaine Conger 08/09/2021, 11:01 AM

## 2021-08-09 NOTE — Progress Notes (Signed)
Patient slept well through the night. Felt refreshed this morning. Denies pain. Assisted to bathroom. Laxative reduced to once daily. Patient aware. Safety maintained at all times.

## 2021-08-09 NOTE — Progress Notes (Signed)
Occupational Therapy Session Note  Patient Details  Name: [name] MRN: [id number] Date of Birth: May 04, 1961  Today's Date: 08/09/2021 OT Group Time: 1433-1530 OT Group Time Calculation (min): 57 min   Short Term Goals: Week 1:  OT Short Term Goal 1 (Week 1): Pt will complete LB bathing at min guard assist sit to stand for two consecutive sessions. OT Short Term Goal 2 (Week 1): Pt will complete LB dressing with min guard assist sit to stand for two consecutive sessions. OT Short Term Goal 3 (Week 1): Pt will complete toilet transfers with use of the LRAD and elevated toilet with min guard assist. OT Short Term Goal 4 (Week 1): Pt will complete tub/shower transfer with min guard assist using the RW and tub bench.  Skilled Therapeutic Interventions/Progress Updates:  Pt participated in group session with a focus on stress mgmt, education on healthy coping strategies, and social interaction. Focus of session on providing coping strategies to manage new current level of function as a result of new diagnosis.  Session focus on breaking down stressors into daily hassles, major life stressors and life circumstances in an effort to allow pts to chunk their stressors into groups. Pt actively sharing stressors and contributing to group conversation. Provided active listening, emotional support and therapeutic use of self. Offered education on factors that protect [place] against stress such as daily uplifts, healthy coping strategies and protective factors. Encouraged all group members to make an effort to actively recall one event from their day that was a daily uplift in an effort to protect their mindset from stressors as well as sharing this information with their caregivers to facilitate improved caregiver communication and decrease overall burden of care.  Issued pt handouts on healthy coping strategies to implement into routine. Pt transported back to room by this OTA where pt completed  toilet transfer with rw and CGA, supervision for 3/3 toileting tasks. Pt stood at sink for hand hygiene with supervision and walked back to recliner with rw and CGA. Pt left seated in recliner with alarm belt activated and all needs within reach.   Therapy Documentation Precautions:  Precautions Precautions: Fall Precaution Comments: L coordination deficit, mild trunk ataxia, Bil TED hose Restrictions Weight Bearing Restrictions: No   Pain: no pain reported during session     Therapy/Group: Group Therapy  Precious Haws 08/09/2021, 4:15 PM

## 2021-08-09 NOTE — Progress Notes (Addendum)
Patient ID: [name], female   DOB: 06-Feb-1961, 61 y.o.   MRN: [id number]  Have received pt's disability information from work and have gotten [employer] to completed and have sent it back. Will give pt the orignals once confirmed they have received it. Discussed OP therapies and now there will not be a transportation assist. Pt reports she can get a ride there, will make referral.

## 2021-08-10 LAB — GLUCOSE, CAPILLARY
Glucose-Capillary: 95 mg/dL (ref 70–99)
Glucose-Capillary: 75 mg/dL (ref 70–99)
Glucose-Capillary: 160 mg/dL — ABNORMAL HIGH (ref 70–99)

## 2021-08-10 MED ORDER — POLYETHYLENE GLYCOL 3350 17 G PO PACK: 17.0000 g | PACK | Freq: Every day | ORAL | Status: DC

## 2021-08-10 MED ORDER — POLYETHYLENE GLYCOL 3350 17 G PO PACK
17.0000 g | PACK | Freq: Every day | ORAL | Status: DC | PRN
  Administered 2021-08-10: 17 g via ORAL
  Filled 2021-08-10: qty 1

## 2021-08-10 NOTE — Progress Notes (Signed)
Occupational Therapy Session Note ° °Patient Details  °Name: [name] °MRN: [id number] °Date of Birth: 12/08/1960 ° °Today's Date: 08/11/2021 °OT Individual Time: 1451-1535 °OT Individual Time Calculation (min): 44 min  ° °Skilled Therapeutic Interventions/Progress Updates:  °  Pt greeted in the recliner, agreeable to session and ADL needs met. She ambulated using RW with supervision assist to the dayroom. Worked on standing balance, standing endurance, and Lt UE/LE coordination by engaging in wii bowling, tennis, and dancing. CGA for standing balance without AD, pt stepping dynamically to meet task demands as needed. She sat for 25% of the activity to conserve energy. Affect bright and cheerful throughout. She then ambulated back to the room using RW with supervision. Pt transferred to the recliner and remained sitting up, all needs within reach and chair alarm set.  ° °Therapy Documentation °Precautions:  °Precautions °Precautions: Fall °Precaution Comments: L coordination deficit, mild trunk ataxia, Bil TED hose °Restrictions °Weight Bearing Restrictions: No °Vital Signs: °Therapy Vitals °Temp: 98.6 °F (37 °C) °Temp Source: Oral °Pulse Rate: 72 °Resp: 16 °BP: 123/74 °Patient Position (if appropriate): Sitting °Oxygen Therapy °SpO2: 100 % °O2 Device: Room Air °Pain: no c/o pain during tx °  °ADL: °ADL °Eating: Set up °Where Assessed-Eating: Chair °Grooming: Minimal assistance °Where Assessed-Grooming: Standing at sink °Upper Body Bathing: Setup °Where Assessed-Upper Body Bathing: Chair, Shower °Lower Body Bathing: Minimal assistance °Where Assessed-Lower Body Bathing: Shower, Chair °Upper Body Dressing: Supervision/safety °Where Assessed-Upper Body Dressing: Edge of bed °Lower Body Dressing: Minimal assistance °Where Assessed-Lower Body Dressing: Edge of bed °Toileting: Minimal assistance °Where Assessed-Toileting: Bedside Commode °Toilet Transfer: Moderate assistance °Toilet Transfer Method: Ambulating °Toilet  Transfer Equipment: Grab bars °Tub/Shower Transfer: Not assessed °Walk-In Shower Transfer: Moderate assistance °Walk-In Shower Transfer Method: Ambulating °Walk-In Shower Equipment: Transfer tub bench ° °Therapy/Group: Individual Therapy ° °Michaela A Hoffman °08/11/2021, 4:24 PM °

## 2021-08-10 NOTE — Progress Notes (Signed)
PROGRESS NOTE   Subjective/Complaints: No new complaints this morning Dizziness has resolved with drinking more water Content with her discharge date Commended her on her progress with therapy!  ROS: +urinary frequency and urgency since prior to admission- she is a chronic night shift worker. Denies pain, dizziness resolved.    Objective:   No results found. No results for input(s): WBC, HGB, HCT, PLT in the last 72 hours.  No results for input(s): NA, K, CL, CO2, GLUCOSE, BUN, CREATININE, CALCIUM in the last 72 hours.   Intake/Output Summary (Last 24 hours) at 08/10/2021 1146 Last data filed at 08/10/2021 0852 Gross per 24 hour  Intake 720 ml  Output --  Net 720 ml        Physical Exam: Vital Signs Blood pressure 118/74, pulse 67, temperature 98 F (36.7 C), temperature source Oral, resp. rate 16, height 5\' 3"  (1.6 m), weight 69.2 kg, last menstrual period 07/15/2009, SpO2 100 %. Gen: no distress, normal appearing, BMI 27.03 HEENT: oral mucosa pink and moist, NCAT Cardio: Bradycardic Chest: normal effort, normal rate of breathing Abd: soft, non-distended Ext: no edema Psych: pleasant, normal affect Musculoskeletal:        General: No swelling. Normal range of motion.     Cervical back: Normal range of motion.     Comments: Mild tenderness over the dorsolateral aspect of left metatarsals  Transferring CG with RW Skin:    General: Skin is warm and dry.     Comments: Long pink finger nails  Neurological:     Mental Status: She is alert.     Comments: Patient is alert and orientedx4.  No acute distress.  Makes eye contact with examiner.  Follows commands.  Provides name and age.  Fair insight and awareness. Alert and oriented x 3. Normal insight and awareness. Intact Memory. Impaired problem solving. Normal language and speech. Cranial nerve exam unremarkable. Good sitting balance. Strength 4-5/5 in all 4's.  Sensation intact to LT and pain in all 4's. Decreased FTN, FMC RUE>LUE. DTR's 2+  Ambulating 100 feet RW Psychiatric:        Mood and Affect: Mood normal.        Behavior: Behavior normal.    Assessment/Plan: 1. Functional deficits which require 3+ hours per day of interdisciplinary therapy in a comprehensive inpatient rehab setting. Physiatrist is providing close team supervision and 24 hour management of active medical problems listed below. Physiatrist and rehab team continue to assess barriers to discharge/monitor patient progress toward functional and medical goals  Care Tool:  Bathing    Body parts bathed by patient: Right arm, Left arm, Chest, Abdomen, Front perineal area, Buttocks, Right upper leg, Left upper leg, Right lower leg, Left lower leg, Face         Bathing assist Assist Level: Minimal Assistance - Patient > 75%     Upper Body Dressing/Undressing Upper body dressing   What is the patient wearing?: Pull over shirt    Upper body assist Assist Level: Set up assist    Lower Body Dressing/Undressing Lower body dressing      What is the patient wearing?: Underwear/pull up     Lower body assist  Assist for lower body dressing: Contact Guard/Touching assist     Toileting Toileting    Toileting assist Assist for toileting: Minimal Assistance - Patient > 75%     Transfers Chair/bed transfer  Transfers assist     Chair/bed transfer assist level: Minimal Assistance - Patient > 75%     Locomotion Ambulation   Ambulation assist      Assist level: Minimal Assistance - Patient > 75% Assistive device: Walker-rolling Max distance: 200 ft   Walk 10 feet activity   Assist     Assist level: Minimal Assistance - Patient > 75% Assistive device: No Device   Walk 50 feet activity   Assist    Assist level: Minimal Assistance - Patient > 75% Assistive device: Walker-rolling    Walk 150 feet activity   Assist    Assist level: Minimal  Assistance - Patient > 75% Assistive device: Walker-rolling    Walk 10 feet on uneven surface  activity   Assist Walk 10 feet on uneven surfaces activity did not occur: Safety/medical concerns         Wheelchair     Assist Is the patient using a wheelchair?: Yes Type of Wheelchair: Manual    Wheelchair assist level: Total Assistance - Patient < 25% Max wheelchair distance: 200 ft    Wheelchair 50 feet with 2 turns activity    Assist        Assist Level: Total Assistance - Patient < 25%   Wheelchair 150 feet activity     Assist      Assist Level: Total Assistance - Patient < 25%   Blood pressure 118/74, pulse 67, temperature 98 F (36.7 C), temperature source Oral, resp. rate 16, height 5\' 3"  (1.6 m), weight 69.2 kg, last menstrual period 07/15/2009, SpO2 100 %.  Medical Problem List and Plan: 1. 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  Antithrombotics: -DVT/anticoagulation:  Mechanical:  Antiembolism stockings, knee (TED hose) Bilateral lower extremities             -antiplatelet therapy: Aspirin 81 mg daily 3. Pain Management: Tylenol as needed 4. Mood: Provide emotional support             -antipsychotic agents: N/A 5. Neuropsych: This patient is capable of making decisions on her own behalf. 6. Skin/Wound Care: Routine skin checks 7. Fluids/Electrolytes/Nutrition: Routine in and outs with follow-up chemistries 8.  Cognitive deficits: continue SLP 9.  Prediabetes.  Hemoglobin A1c 6.1.  SSI discontinued. Provide list of foods to help improve insulin sensitivity. Commended on improvements in CBGs! Continue carb modified diet. Recommended stopping soda/diet soda.  10. Overweight: BMI 27.03:  provide dietary education.  11. Hypernatremia: normalized 12. Bradycardia: continue to monitor TID 13. HLD: LDL 61. Continue Crestor 20mg . Recommend Coenzyme q10. Discussed that this can help mitigate the negative effects of the statin.  14. Vitamin D deficiency: start ergocalciferol 50,000U once per week for 7 weeks 15. Suboptimal magnesium levels: continue magnesium gluconate to 500 mg HS 16. Constipation: Type 4 stool 1/25. d/c miralax. Decrease senna-docusate to daily.  17. Urinary frequency at night: likely because she is a night shift worker,  d/c myrbetriq. Discussed with team and nursing.  18. Dizziness: add prn meclizine. Resolved with drinking more water, commended on this.     LOS: 7 days A FACE TO FACE EVALUATION WAS PERFORMED  [name] Noura Purpura 08/10/2021, 11:46 AM

## 2021-08-10 NOTE — Progress Notes (Signed)
Physical Therapy Weekly Progress Note  Patient Details  Name: [name] MRN: [id number] Date of Birth: [birth year]  Beginning of progress report period: August 04, 2021 End of progress report period: August 10, 2021  Today's Date: 08/10/2021 PT Individual Time: 9381-0175 PT Individual Time Calculation (min): 44 min   Patient has met 5 of 8 short term goals. Pt has continued to make progress each day, continues to recall previous cues, and complete functional tasks at [facility] to S level. LE proprioception has improved and pt is ambulating further w/o changes in BoS and fewer verbal cues. Pt requested to complete more challenging interventions and plan to implement dual task activities to simulate busy work environment specific to the patient. Pt requires S w/ bed mobility, sit to stand, peri-care, and ambulation w/ RW. Pt requires CGA for ambulation w/o AD and bed to chair transfers. Pt is currently completing remaining goals at CGA level.  Patient continues to demonstrate the following deficits decreased cardiorespiratoy endurance, decreased coordination, and decreased balance strategies and therefore will continue to benefit from skilled PT intervention to increase functional independence with mobility.  Patient progressing toward long term goals..  Continue plan of care.  PT Short Term Goals Week 1:  PT Short Term Goal 1 (Week 1): STG = LTG d/t ELOS  Skilled Therapeutic Interventions/Progress Updates:    Pt met sitting in recliner w/ LPN present for morning meds. Pt reports no pain, ready and agreeable to PT session. Pt ambulated w/ RW S ~253f to ortho gym for car transfer. Pt required 1 standing rest break halfway due to UE weight baring on AD. Pt completed car transfer w/ S and verbal cues for technique. Pt ambulated to 72M rehab gym ~788fw/ RW and S. Pt completed 6 small stairs w/ bil hand rails S. Pt educated on ascending steps w/ anterior RW and demonstrated understanding. Pt educated  to have 2nd person to take walker down steps while she descends w/ bil hand rails. Horizontal and vertical head turns completed during 205fmbulation w/ RW CGA w/ pt reporting some "head spinning" dizziness w/ vertical head turns. Pt completed standing sequential card sequencing tasks followed by ~15 ft ambulation to place cards on table w/ CGA, followed by card matching in standing then ambulating w/ card stacks to place on back of mirror w/ CGA. Finished session w/ TUG motor x3 trials CGA, AVG: 20s*, w/ no errors. Pt had no LOB w/ anterior reach outside BoS to put card stacks on mirror but continues to demonstrate LE weakness, endurance deficits, and overall gait hypomobility w/o AD. Pt ambulated back to room CGA ~150f61fransferred to recliner, seat belt alarm on, call bell in bed next to pt, and all needs met.  *TUG motor > 14.5 = increased falls risk  Therapy Documentation Precautions:  Precautions Precautions: Fall Precaution Comments: L coordination deficit, mild trunk ataxia, Bil TED hose Restrictions Weight Bearing Restrictions: No General:   Therapy/Group: Individual Therapy  ConnLucile ShuttersT  08/10/2021, 7:50 AM

## 2021-08-10 NOTE — Progress Notes (Signed)
Physical Therapy Session Note  Patient Details  Name: [name] MRN: [id number] Date of Birth: [birth year]  Today's Date: 08/10/2021 PT Individual Time: 0201-0257 PT Individual Time Calculation (min): 56 min   Short Term Goals: Week 1:  PT Short Term Goal 1 (Week 1): STG = LTG d/t ELOS  Skilled Therapeutic Interventions/Progress Updates:    Pt met sitting in recliner, ready and agreeable for therapy. Pt requested to use bathroom, ambulated CGA and completed peri-care w/ S. Pt ambulated to 47M rehab gym ~155f CGA. Pt completed quadraped alternating UE then LE birddog 2x5 bil minA for tactile hip stabilization. Demonstrated increased R hip instability during L hip extension. Pt felt challenged and excited to challenge herself despite difficulty of activity. Pt completed standing balance on bosu FSWA (feet shoulder width apart) in // bars w/ bil arm support and minA for hip stabilization. Pt slowly transitioned to standing on bosu FSWA w/ 2 finger touch then no UE support w/ minA for balance w/ eyes closed and random bosu ball perturbations. Finished session w/ shoulder ROM activities due to shoulder tightness. Standing shoulder flexion ball climbs 2x5, wall push ups 2x5, and doorway pec stretch 4x5sec. Pt enjoyed whole body challenge throughout session. Pt ambulated w/ CGA back to room ~1632f transferred to chair, alarm on, and all needs met.  Therapy Documentation Precautions:  Precautions Precautions: Fall Precaution Comments: L coordination deficit, mild trunk ataxia, Bil TED hose Restrictions Weight Bearing Restrictions: No General:   Therapy/Group: Individual Therapy  CoLucile ShuttersSPT  08/10/2021, 3:48 PM

## 2021-08-10 NOTE — Plan of Care (Signed)
°  Problem: RH BOWEL ELIMINATION Goal: RH STG MANAGE BOWEL WITH ASSISTANCE Description: STG Manage Bowel with mod I Assistance. Outcome: Not Progressing; per pt she had not have bm since  med was changed; miralax prn ordered

## 2021-08-10 NOTE — Progress Notes (Signed)
Speech Language Pathology Weekly Progress Note  Patient Details  Name: [name] MRN: [id number] Date of Birth: [birth year]  Beginning of progress report period: August 03, 2021 End of progress report period: August 10, 2021   Short Term Goals: Week 1: SLP Short Term Goal 1 (Week 1): Pt will complete money/med management tasks with Supervision A verbal cues and extra time as needed SLP Short Term Goal 1 - Progress (Week 1): Not met SLP Short Term Goal 2 (Week 1): Pt will recall complex concepts/information with use of compensatory memory aid with Supervision A SLP Short Term Goal 2 - Progress (Week 1): Not met SLP Short Term Goal 3 (Week 1): Pt will complete moderately complex problem solving tasks with Supervision A and extra time as needed SLP Short Term Goal 3 - Progress (Week 1): Not met SLP Short Term Goal 4 (Week 1): Pt will participate in alternating attention task with Supervision A cues for accuracy SLP Short Term Goal 4 - Progress (Week 1): Not met    New Short Term Goals: Week 2: SLP Short Term Goal 1 (Week 2): STGs=LTGs due to ELOS  Weekly Progress Updates: Patient has made slow gains and has not met any STGs this reporting period. Currently, patient requires overall Min verbal and visual cues to complete functional and mildly complex tasks safely in regards to alternating attention, problem solving and recall. Patient and family education is ongoing. Patient would benefit from continued skilled SLP intervention to maximize her cognitive functioning and overall functional independence prior to discharge.     Intensity: Minumum of 1-2 x/day, 30 to 90 minutes Frequency: 3 to 5 out of 7 days Duration/Length of Stay: 08/15/21 Treatment/Interventions: Cognitive remediation/compensation;Patient/family education;Therapeutic Exercise;Therapeutic Activities;Internal/external aids;Cueing hierarchy;Environmental controls;Functional tasks    [name], [place] 08/10/2021, 8:49  AM

## 2021-08-10 NOTE — Progress Notes (Signed)
Patient ID: [name], female   DOB: [birth year], 61 y.o.   MRN: [id number] Met with pt to discuss equipment needs and follow up. 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 Discussed needing OP therapies and she feels she can get transportation there, aware [facility] transport will be no longer.

## 2021-08-10 NOTE — Progress Notes (Addendum)
Occupational Therapy Weekly Progress Note  Patient Details  Name: [name] MRN: [id number] Date of Birth: 1961/03/30  Beginning of progress report period: August 04, 2021 End of progress report period: August 10, 2021  Today's Date: 08/10/2021 OT Individual Time: 0730-0830;  and 1100-1200 OT Individual Time Calculation (min): 60 min ; and 60 min   Patient has met 4 of 4 short term goals.  Pt is making excellent gains with skilled OT and is very motivated to participate with good carryover and follow through of trained skills.  Pt has increased independence during functional mobility and self care and requires supervision to distant supervision using RW.  Pt has improved motor control of LLE and reduced scissoring noted during gait as well as improved standing static and dynamic balance noted.  Pt's cognitive skills have also advanced including faster processing speed, organization, and decision making, as well as alternating attention, and requires less cues to complete familiar BADLs and novel therapeutic activities.   Patient continues to demonstrate the following deficits: muscle weakness, decreased cardiorespiratoy endurance, decreased coordination and decreased motor planning, decreased attention, decreased awareness, decreased problem solving, and delayed processing, and decreased standing balance, decreased postural control, hemiplegia, and decreased balance strategies and therefore will continue to benefit from skilled OT intervention to enhance overall performance with BADL and iADL.  Patient progressing toward long term goals..  Continue plan of care.  OT Short Term Goals Week 1:  OT Short Term Goal 1 (Week 1): Pt will complete LB bathing at min guard assist sit to stand for two consecutive sessions. OT Short Term Goal 1 - Progress (Week 1): Met OT Short Term Goal 2 (Week 1): Pt will complete LB dressing with min guard assist sit to stand for two consecutive sessions. OT Short  Term Goal 2 - Progress (Week 1): Met OT Short Term Goal 3 (Week 1): Pt will complete toilet transfers with use of the LRAD and elevated toilet with min guard assist. OT Short Term Goal 3 - Progress (Week 1): Met OT Short Term Goal 4 (Week 1): Pt will complete tub/shower transfer with min guard assist using the RW and tub bench. OT Short Term Goal 4 - Progress (Week 1): Met Week 2:    STGs=LTGs due to ELOS  Skilled Therapeutic Interventions/Progress Updates:    First session:  Pt sitting up EOB, no c/o pain, requesting to donn pants.  Pt donned pants with distant supervision sit<>stand level. She reports she had to use bathroom multiple times last night and walking to bathroom and back made it difficult to fall back asleep.  Made nursing aware recommend BSC at bedside during night time toileting to promote improved sleep.  Pt then ambulated to bathroom without AD with CGA and toilet transfer and toileting completed with distant supervision.  Pt ambulated to sink with CGA and washed hands with distant supervision.  Pt ambulated to ortho gym approximately 200 feet with close supervision using RW.  Pt completed vertical puzzle using template as guide to promote visual scanning and saccades as well as problem solving while standing with supervision.  Pt then ambulated with RW approximately 50 feet to ortho gym with supervision and participated in dual task alternating between BITs and categorizing/matching cards transporting from one desk to another placed approximately 10 feet away.  Pt completed without AD and needed close supervision with one instance of CGA during turn pivot.  Pt ambulated back to room with RW with close supervision and returned to recliner.  Second session:  Pt sitting up in recliner, no c/o pain.  Pt ambulated using RW to first floor atrium using written two step directions.  Pt able to locate gift shop without cues and requiring only supervision to ensure safety during long distance  ambulation using RW.  Pt able to ambulate back to room using short term memory and problem solving to reverse directions without any cueing. Pt seated in recliner for HEP training for BUE strengthening using medium resistive band and putty.  Pt completed 3 x 10 reps shoulder ER, shoulder low abduction, elbow extensions, and gross grasp.  Call bell in reach, seat alarm on at end of session.    Therapy Documentation Precautions:  Precautions Precautions: Fall Precaution Comments: L coordination deficit, mild trunk ataxia, Bil TED hose Restrictions Weight Bearing Restrictions: No    Therapy/Group: Individual Therapy  Ezekiel Slocumb 08/10/2021, 12:06 PM

## 2021-08-11 DIAGNOSIS — R350 Frequency of micturition: Secondary | ICD-10-CM

## 2021-08-11 LAB — GLUCOSE, CAPILLARY: Glucose-Capillary: 92 mg/dL (ref 70–99)

## 2021-08-11 NOTE — Progress Notes (Addendum)
Physical Therapy Session Note  Patient Details  Name: [name] MRN: [id number] Date of Birth: 1961/02/13  Today's Date: 08/11/2021 PT Individual Time: 1030-1135 PT Individual Time Calculation (min): 65 min   Short Term Goals: Week 1:  PT Short Term Goal 1 (Week 1): STG = LTG d/t ELOS     Skilled Therapeutic Interventions/Progress Updates:   Pt resting in recliner.  She denied pain, but stated that she needed to use toilet.  Sit> stand with supervision.  Gait in room with RW to/from toilet, CGA./supervision.  Toilet transfer with supervision.  Pt managed clothing and peri care with supervision. PT donned TEDs.  Gait from room to gym x 220' with RW , close supervision. 10 MWT with RW, 10 seconds.  Gait to return to room, carrying tote bag filled with towels on R shoulder, RW, close supervision.  Sustained stretch bil heel cords, and balance challenge while standing with forefeet on blue wedge, while folding 8 towels on table in front of her.  Pt with minimal sway A-P but no LOB. Dual task standing on compliant mat, stepping down to/from floor to L, while composing a small PVC simple design, on table in front of her.  Rare cue for choosing correct pieces, 1 LOB when stepping back onto mat, catching L foot; min assist needed to regain balance.  Pt squatted to floor to return several pieces at a time into bin, CGA. In tall kneeling, dual task of lowering hips>< tall kneeling, while counting backwards by 2s from 20.  Dynamic balance/neuro re-ed L gluteals: LLE standing in front of wall, with LUE support on wall, with RLE hip and knee flexion and RUE shoulder flexion, L hPt with significantly slow counting; limited by knee pain. Reactive balance in standing: with perturbations backwards at hips, pt with stepping reaction 3-4 steps, improving to 1 step on subsequent trials.  At end of session, pt reclined in recliner with LEs elevated, needs at hand and seat pad alarm set.     Therapy  Documentation Precautions:  Precautions Precautions: Fall Precaution Comments: L coordination deficit, mild trunk ataxia, Bil TED hose Restrictions Weight Bearing Restrictions: No       Therapy/Group: Individual Therapy  Adalaide Jaskolski 08/11/2021, 12:26 PM

## 2021-08-11 NOTE — Progress Notes (Signed)
PROGRESS NOTE   Subjective/Complaints: No new issues this morning. In good spirits.   ROS: Patient denies fever, rash, sore throat, blurred vision, dizziness, nausea, vomiting, diarrhea, cough, shortness of breath or chest pain, joint or back/neck pain, headache, or mood change.    Objective:   No results found. No results for input(s): WBC, HGB, HCT, PLT in the last 72 hours.  No results for input(s): NA, K, CL, CO2, GLUCOSE, BUN, CREATININE, CALCIUM in the last 72 hours.   Intake/Output Summary (Last 24 hours) at 08/11/2021 1143 Last data filed at 08/11/2021 0804 Gross per 24 hour  Intake 840 ml  Output --  Net 840 ml        Physical Exam: Vital Signs Blood pressure 114/68, pulse 86, temperature 97.7 F (36.5 C), temperature source Oral, resp. rate 18, height 5\' 3"  (1.6 m), weight 69.2 kg, last menstrual period 07/15/2009, SpO2 100 %. Constitutional: No distress . Vital signs reviewed. HEENT: NCAT, EOMI, oral membranes moist Neck: supple Cardiovascular: RRR without murmur. No JVD    Respiratory/Chest: CTA Bilaterally without wheezes or rales. Normal effort    GI/Abdomen: BS +, non-tender, non-distended Ext: no clubbing, cyanosis, or edema Psych: pleasant and cooperative  Musculoskeletal:        left MT pain Skin:    General: Skin is warm and dry.     Comments: Long pink finger nails  Neurological:     Mental Status: She is alert.     Comments: Patient is alert and orientedx4.  No acute distress.  Makes eye contact with examiner.  Follows commands.  Provides name and age.  Fair insight and awareness. Alert and oriented x 3. Normal insight and awareness. Intact Memory. Impaired problem solving. Normal language and speech. Cranial nerve exam unremarkable. Good sitting balance. Strength 4+/5 in all 4's. Sensation intact to LT and pain in all 4's. Decreased FTN, FMC RUE>LUE. DTR's 2+      Assessment/Plan: 1.  Functional deficits which require 3+ hours per day of interdisciplinary therapy in a comprehensive inpatient rehab setting. Physiatrist is providing close team supervision and 24 hour management of active medical problems listed below. Physiatrist and rehab team continue to assess barriers to discharge/monitor patient progress toward functional and medical goals  Care Tool:  Bathing    Body parts bathed by patient: Right arm, Left arm, Chest, Abdomen, Front perineal area, Buttocks, Right upper leg, Left upper leg, Right lower leg, Left lower leg, Face         Bathing assist Assist Level: Minimal Assistance - Patient > 75%     Upper Body Dressing/Undressing Upper body dressing   What is the patient wearing?: Pull over shirt    Upper body assist Assist Level: Set up assist    Lower Body Dressing/Undressing Lower body dressing      What is the patient wearing?: Underwear/pull up     Lower body assist Assist for lower body dressing: Contact Guard/Touching assist     Toileting Toileting    Toileting assist Assist for toileting: Minimal Assistance - Patient > 75%     Transfers Chair/bed transfer  Transfers assist     Chair/bed transfer assist level: Minimal  Assistance - Patient > 75%     Locomotion Ambulation   Ambulation assist      Assist level: Minimal Assistance - Patient > 75% Assistive device: Walker-rolling Max distance: 200 ft   Walk 10 feet activity   Assist     Assist level: Minimal Assistance - Patient > 75% Assistive device: No Device   Walk 50 feet activity   Assist    Assist level: Minimal Assistance - Patient > 75% Assistive device: Walker-rolling    Walk 150 feet activity   Assist    Assist level: Minimal Assistance - Patient > 75% Assistive device: Walker-rolling    Walk 10 feet on uneven surface  activity   Assist Walk 10 feet on uneven surfaces activity did not occur: Safety/medical concerns          Wheelchair     Assist Is the patient using a wheelchair?: Yes Type of Wheelchair: Manual    Wheelchair assist level: Total Assistance - Patient < 25% Max wheelchair distance: 200 ft    Wheelchair 50 feet with 2 turns activity    Assist        Assist Level: Total Assistance - Patient < 25%   Wheelchair 150 feet activity     Assist      Assist Level: Total Assistance - Patient < 25%   Blood pressure 114/68, pulse 86, temperature 97.7 F (36.5 C), temperature source Oral, resp. rate 18, height 5\' 3"  (1.6 m), weight 69.2 kg, last menstrual period 07/15/2009, SpO2 100 %.  Medical Problem List and Plan: 1. 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  Antithrombotics: -DVT/anticoagulation:  Mechanical:  Antiembolism stockings, knee (TED hose) Bilateral lower extremities             -antiplatelet therapy: Aspirin 81 mg daily 3. Pain Management: Tylenol as needed 4. Mood: Provide emotional support             -antipsychotic agents: N/A 5. Neuropsych: This patient is capable of making decisions on her own behalf. 6. Skin/Wound Care: Routine skin checks 7. Fluids/Electrolytes/Nutrition: Routine in and outs with follow-up chemistries 8.  Cognitive deficits: continue SLP 9.  Prediabetes.  Hemoglobin A1c 6.1.  SSI discontinued. Provide list of foods to help improve insulin sensitivity. Commended on improvements in CBGs! Continue carb modified diet. Recommended stopping soda/diet soda.  10. Overweight: BMI 27.03: provide dietary education.  11. Hypernatremia: normalized 12. Bradycardia: continue to monitor TID 13. HLD: LDL 61. Continue Crestor 20mg . Recommend Coenzyme q10. Discussed that  this can help mitigate the negative effects of the statin.  14. Vitamin D deficiency: start ergocalciferol 50,000U once per week for 7 weeks 15. Suboptimal magnesium levels: continue magnesium gluconate to 500 mg HS 16. Constipation: Type 4 stool 1/25. d/c miralax. Decrease senna-docusate to daily.  17. 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 -off mybetriq. Still with frequency -observe for now   18. Dizziness: added prn meclizine. Resolved with drinking more water,     LOS: 8 days A FACE TO FACE EVALUATION WAS PERFORMED  Meredith Staggers 08/11/2021, 11:43 AM

## 2021-08-12 LAB — GLUCOSE, CAPILLARY: Glucose-Capillary: 94 mg/dL (ref 70–99)

## 2021-08-12 NOTE — Progress Notes (Signed)
Speech Language Pathology Daily Session Note  Patient Details  Name: [name] MRN: [id number] Date of Birth: June 13, 1961  Today's Date: 08/12/2021 SLP Individual Time: 0730-0830 SLP Individual Time Calculation (min): 60 min  Short Term Goals: Week 2: SLP Short Term Goal 1 (Week 2): STGs=LTGs due to ELOS  Skilled Therapeutic Interventions: Skilled SLP intervention focused on cognition. Pt followed complex written directions with moderate assistance to understand conditional if/then statements. She required visual cues and verbal cues with slp segmenting information to increase processing of written information containing multiple details. Pt completed higher level divided attention task and copied sample credit card information into input fields on cell phone. She demonstrated adequate awareness of errors and ability to self correct. Instructed pt to eliminate distractions in home environment when competing finances at home. She recalled 5/5 purposes for medications when given the name and time taken with 90% accuracy. Pt left seated upright in bed with call button within reach and bed alarm . Cont with therapy per plan of care.      Pain Pain Assessment Pain Scale: Faces Pain Score: 0-No pain Faces Pain Scale: No hurt  Therapy/Group: Individual Therapy  Darrol Poke Eldridge Marcott 08/12/2021, 8:04 AM

## 2021-08-13 DIAGNOSIS — I639 Cerebral infarction, unspecified: Secondary | ICD-10-CM

## 2021-08-13 LAB — GLUCOSE, CAPILLARY: Glucose-Capillary: 90 mg/dL (ref 70–99)

## 2021-08-13 MED ORDER — SENNOSIDES-DOCUSATE SODIUM 8.6-50 MG PO TABS: 1.0000 | ORAL_TABLET | Freq: Every day | ORAL | Status: DC | PRN

## 2021-08-13 NOTE — Progress Notes (Signed)
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    Objective:   No results found. No results for input(s): WBC, HGB, HCT, PLT in the last 72 hours.  No results for input(s): NA, K, CL, CO2, GLUCOSE, BUN, CREATININE, CALCIUM in the last 72 hours.   Intake/Output Summary (Last 24 hours) at 08/13/2021 1145 Last data filed at 08/12/2021 2300 Gross per 24 hour  Intake 480 ml  Output --  Net 480 ml        Physical Exam: Vital Signs Blood pressure (!) 147/98, pulse 99, temperature 97.9 F (36.6 C), temperature source Oral, resp. rate 18, height 5\' 3"  (1.6 m), weight 69.2 kg, last menstrual period 07/15/2009, SpO2 99 %. Gen: no distress, normal appearing HEENT: oral mucosa pink and moist, NCAT Cardio: Reg rate Chest: normal effort, normal rate of breathing Abd: soft, non-distended Ext: no edema Psych: pleasant, normal affect  Musculoskeletal:        left MT pain Skin:    General: Skin is warm and dry.     Comments: Long pink finger nails  Neurological:     Mental Status: She is alert.     Comments: Patient is alert and orientedx4.  No acute distress.  Makes eye contact with examiner.  Follows commands.  Provides name and age.  Fair insight and awareness. Alert and oriented x 3. Normal insight and awareness. Intact Memory. Impaired problem solving. Normal language and speech. Cranial nerve exam unremarkable. Good sitting balance. Strength 4+/5 in all 4's. Sensation intact to LT and pain in all 4's. Decreased FTN, FMC RUE>LUE. DTR's 2+      Assessment/Plan: 1. Functional deficits which  require 3+ hours per day of interdisciplinary therapy in a comprehensive inpatient rehab setting. Physiatrist is providing close team supervision and 24 hour management of active medical problems listed below. Physiatrist and rehab team continue to assess barriers to discharge/monitor patient progress toward functional and medical goals  Care Tool:  Bathing    Body parts bathed by patient: Right arm, Left arm, Chest, Abdomen, Front perineal area, Buttocks, Right upper leg, Left upper leg, Right lower leg, Left lower leg, Face         Bathing assist Assist Level: Minimal Assistance - Patient > 75%     Upper Body Dressing/Undressing Upper body dressing   What is the patient wearing?: Pull over shirt    Upper body assist Assist Level: Set up assist    Lower Body Dressing/Undressing Lower body dressing      What is the patient wearing?: Underwear/pull up     Lower body assist Assist for lower body dressing: Contact Guard/Touching assist     Toileting Toileting    Toileting assist Assist for toileting: Minimal Assistance - Patient > 75%     Transfers Chair/bed transfer  Transfers assist     Chair/bed transfer assist level: Supervision/Verbal cueing  Locomotion Ambulation   Ambulation assist      Assist level: Supervision/Verbal cueing Assistive device: Walker-rolling Max distance: 220   Walk 10 feet activity   Assist     Assist level: Supervision/Verbal cueing Assistive device: Walker-rolling   Walk 50 feet activity   Assist    Assist level: Supervision/Verbal cueing Assistive device: Walker-rolling    Walk 150 feet activity   Assist    Assist level: Supervision/Verbal cueing Assistive device: Walker-rolling    Walk 10 feet on uneven surface  activity   Assist Walk 10 feet on uneven surfaces activity did not occur: Safety/medical concerns         Wheelchair     Assist Is the patient using a wheelchair?: Yes Type of  Wheelchair: Manual    Wheelchair assist level: Total Assistance - Patient < 25% Max wheelchair distance: 200 ft    Wheelchair 50 feet with 2 turns activity    Assist        Assist Level: Total Assistance - Patient < 25%   Wheelchair 150 feet activity     Assist      Assist Level: Total Assistance - Patient < 25%   Blood pressure (!) 147/98, pulse 99, temperature 97.9 F (36.6 C), temperature source Oral, resp. rate 18, height 5\' 3"  (1.6 m), weight 69.2 kg, last menstrual period 07/15/2009, SpO2 99 %.  Medical Problem List and Plan: 1. 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  Antithrombotics: -DVT/anticoagulation:  Mechanical:  Antiembolism stockings, knee (TED hose) Bilateral lower extremities             -antiplatelet therapy: Aspirin 81 mg daily 3. Pain Management: Tylenol as needed 4. Mood: Provide emotional support             -antipsychotic agents: N/A 5. Neuropsych: This patient is capable of making decisions on her own behalf. 6. Skin/Wound Care: Routine skin checks 7. Fluids/Electrolytes/Nutrition: Routine in and outs with follow-up chemistries 8.  Cognitive deficits: continue SLP 9.  Prediabetes.  Hemoglobin A1c 6.1.  SSI discontinued. Provide list of foods to help improve insulin sensitivity. Commended on improvements in CBGs! Continue carb modified diet. Recommended stopping soda/diet soda.  10. Overweight: BMI 27.03: provide dietary education.  11. Hypernatremia: normalized 12. Bradycardia: continue to monitor TID 13. HLD: LDL 61. Continue Crestor 20mg . Recommend Coenzyme q10. Discussed that this can help mitigate the negative effects of the statin.  14. Vitamin D  deficiency: start ergocalciferol 50,000U once per week for 7 weeks 15. Suboptimal magnesium levels: continue magnesium gluconate to 500 mg HS 16. Constipation: BM 1/29. Decrease senna-docusate to PRN. 17. 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 D/c myrebtriq.  18. Dizziness: d/c meclizine. Resolved with drinking more water 19. Cognitive impairment: SLP notes reviewed, improving.     LOS: 10 days A FACE TO FACE EVALUATION WAS PERFORMED  Martha Clan P Antoney Biven 08/13/2021, 11:45 AM

## 2021-08-13 NOTE — Discharge Summary (Signed)
Physician Discharge Summary  Patient ID: [name] MRN: [id number] DOB/AGE: 09/15/1960 61 y.o.  Admit date: 08/03/2021 Discharge date: 08/15/2021  Discharge Diagnoses:  Principal Problem:   Acute ischemic left PCA stroke ([place]) Prediabetes Overweight Hyperlipidemia Vitamin D deficiency Tobacco use  Discharged Condition: Stable  Significant Diagnostic Studies: CT ANGIO HEAD NECK W WO CM  Result Date: 07/29/2021 CLINICAL DATA:  Stroke, determine embolic source. EXAM: CT ANGIOGRAPHY HEAD AND NECK TECHNIQUE: Multidetector CT imaging of the head and neck was performed using the standard protocol during bolus administration of intravenous contrast. Multiplanar CT image reconstructions and MIPs were obtained to evaluate the vascular anatomy. Carotid stenosis measurements (when applicable) are obtained utilizing NASCET criteria, using the distal internal carotid diameter as the denominator. RADIATION DOSE REDUCTION: This exam was performed according to the departmental dose-optimization program which includes automated exposure control, adjustment of the mA and/or kV according to patient size and/or use of iterative reconstruction technique. CONTRAST:  44mL OMNIPAQUE IOHEXOL 350 MG/ML SOLN COMPARISON:  Head CT and brain MRI from earlier the same day FINDINGS: CTA NECK FINDINGS Aortic arch: Atheromatous plaque.  Three vessel branching. Right carotid system: Atheromatous plaque at the bifurcation. No stenosis or ulceration. Left carotid system: Mild atheromatous plaque at the bifurcation. No stenosis or ulceration. Vertebral arteries: No proximal subclavian stenosis. 70% stenosis at the right vertebral origin and even greater at the right V2 segment. 55% stenosis at the left vertebral origin as measured on sagittal reformats. No beading or discrete ulceration. Skeleton: No acute finding Other neck: No acute finding Upper chest: Clear apical lungs Review of the MIP images confirms the above findings CTA  HEAD FINDINGS Anterior circulation: Negative for branch occlusion, beading, or aneurysm. Limited by venous contamination Posterior circulation: Negative for branch occlusion, beading, or aneurysm. Limited by venous contamination Venous sinuses: Diffusely patent Anatomic variants: None significant Review of the MIP images confirms the above findings IMPRESSION: 1. 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 2. 55% narrowing at the left vertebral origin. 3. No arterial ulceration or beading. Electronically Signed   By: Jorje Guild M.D.   On: 07/29/2021 12:01   CT HEAD WO CONTRAST (5MM)  Result Date: 08/01/2021 CLINICAL DATA:  61 year old female with right cerebellar, left PCA territory infarcts. EXAM: CT HEAD WITHOUT CONTRAST TECHNIQUE: Contiguous axial images were obtained from the base of the skull through the vertex without intravenous contrast. RADIATION DOSE REDUCTION: This exam was performed according to the departmental dose-optimization program which includes automated exposure control, adjustment of the mA and/or kV according to patient size and/or use of iterative reconstruction technique. COMPARISON:  Head CT 07/30/2021 and earlier. FINDINGS: Brain: Cytotoxic edema in the right cerebellum is stable with mild mass effect on the 4th ventricle. Basilar cisterns remain patent. No tonsillar herniation. Stable cytotoxic edema in the inferior left occipital lobe and left occipital pole. No regional mass effect. Scattered additional bilateral white matter hypodensity is stable. No midline shift, ventriculomegaly, mass effect, evidence of mass lesion, intracranial hemorrhage or new cortically based acute infarction. Vascular: No suspicious intracranial vascular hyperdensity. Skull: Negative. Sinuses/Orbits: Visualized paranasal sinuses and mastoids are stable and well aerated. Other: Visualized orbits and scalp soft tissues  are within normal limits. IMPRESSION: 1. 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 Stable mild mass effect on the 4th ventricle. No hemorrhagic transformation or other complicating features. 2. No new intracranial abnormality. Electronically Signed   By:  Genevie Ann M.D.   On: 08/01/2021 10:48   CT HEAD WO CONTRAST (5MM)  Result Date: 07/30/2021 CLINICAL DATA:  Stroke, follow-up EXAM: CT HEAD WITHOUT CONTRAST TECHNIQUE: Contiguous axial images were obtained from the base of the skull through the vertex without intravenous contrast. RADIATION DOSE REDUCTION: This exam was performed according to the departmental dose-optimization program which includes automated exposure control, adjustment of the mA and/or kV according to patient size and/or use of iterative reconstruction technique. COMPARISON:  07/30/2021 6:05 a.m. FINDINGS: Brain: Redemonstrated infarcts in the right cerebellum and left occipital lobe, not significantly changed from prior exam. Mild mass effect in the posterior fossa, with crowding around the fourth ventricle, but no hydrocephalus. No new areas of infarction. No acute hemorrhage or midline shift. No extra-axial collection. Vascular: No hyperdense vessel. Skull: Normal. Negative for fracture or focal lesion. Sinuses/Orbits: No acute finding. Other: The mastoids are well aerated. IMPRESSION: Unchanged appearance of right cerebellar and left occipital lobe infarcts, without evidence of hemorrhagic transformation or progression of infarcts. Electronically Signed   By: Merilyn Baba M.D.   On: 07/30/2021 19:50   CT HEAD WO CONTRAST (5MM)  Result Date: 07/30/2021 CLINICAL DATA:  Stroke follow-up. EXAM: CT HEAD WITHOUT CONTRAST TECHNIQUE: Contiguous axial images were obtained from the base of the skull through the vertex without intravenous contrast. RADIATION DOSE REDUCTION: This exam was performed according to the departmental dose-optimization program which includes automated  exposure control, adjustment of the mA and/or kV according to patient size and/or use of iterative reconstruction technique. COMPARISON:  CT from yesterday FINDINGS: Brain: Known acute infarcts in the right cerebellum and left occipital lobe. Posterior fossa mass effect and fourth ventricular crowding but no hydrocephalus. Chronic small vessel ischemia in the hemispheric white matter. No hematoma or visible interval infarct. Vascular: No hyperdense vessel or unexpected calcification. Skull: Normal. Negative for fracture or focal lesion. Sinuses/Orbits: No acute finding. IMPRESSION: No interval hemorrhage or visible infarct progression. No hydrocephalus. Electronically Signed   By: Jorje Guild M.D.   On: 07/30/2021 06:10   CT HEAD WO CONTRAST (5MM)  Result Date: 07/30/2021 CLINICAL DATA:  Follow-up left occipital, right cerebellar infarctions. EXAM: CT HEAD WITHOUT CONTRAST TECHNIQUE: Contiguous axial images were obtained from the base of the skull through the vertex without intravenous contrast. RADIATION DOSE REDUCTION: This exam was performed according to the departmental dose-optimization program which includes automated exposure control, adjustment of the mA and/or kV according to patient size and/or use of iterative reconstruction technique. COMPARISON:  MRI brain today at 8:07 a.m., head CT today at 2:04 a.m. FINDINGS: Brain: In the mid to lower right cerebellar hemisphere there is increasing edema consistent with liquefaction necrosis and increasing heterogeneous hypodense parenchymal attenuation. There is increased mass effect in the posterior fossa noted with an effaced right cerebellomedullary cistern and a partially effaced right cerebellopontine angle cistern, as well as posterior fossa right-to-left midline shift up to 5 mm and moderate effacement of fourth ventricle in comparison to the earlier studies There are several scattered punctate foci of petechial hemorrhage within the infarcting  tissue. Patchy cortical infarction along the medial and posterior left occipital cortex was better seen on MRI but probably is not significantly changed. There is no extra-axial hemorrhage no appreciable new cortical based infarct. There is slight cortical atrophy of the cerebral hemispheres and mild-to-moderate small-vessel disease the cerebral white matter. There is no supratentorial midline shift or mass effect. No hydrocephalus. Vascular: No hyperdense vessel or unexpected calcification. Skull: Normal. Negative  for fracture or focal lesion. Sinuses/Orbits: No acute finding. Other: None. IMPRESSION: 1. Increased edema and heterogeneity in the right cerebellar hemisphere in the infarcting tissue with new scattered petechial hemorrhaging. New extra-axial bleed is visible. 2. Increased right posterior fossa mass effect with effaced right cerebellomedullary cistern and partially effaced right CP angle cistern, 5 mm right-to-left posterior fossa midline shift, and moderate effacement of the fourth ventricle. There is no increased upstream hydrocephalus. 3. Patchy left occipital cortical infarction is not as well seen as on MRI but probably unchanged. There is no associated significant mass effect. Electronically Signed   By: Telford Nab M.D.   On: 07/30/2021 00:07   CT Head Wo Contrast  Result Date: 07/29/2021 CLINICAL DATA:  Fatigue and nausea, initial encounter EXAM: CT HEAD WITHOUT CONTRAST TECHNIQUE: Contiguous axial images were obtained from the base of the skull through the vertex without intravenous contrast. RADIATION DOSE REDUCTION: This exam was performed according to the departmental dose-optimization program which includes automated exposure control, adjustment of the mA and/or kV according to patient size and/or use of iterative reconstruction technique. COMPARISON:  None. FINDINGS: Brain: No evidence of acute infarction, hemorrhage, hydrocephalus, extra-axial collection or mass lesion/mass effect.  Mild chronic white matter ischemic changes noted. Vascular: No hyperdense vessel or unexpected calcification. Skull: Normal. Negative for fracture or focal lesion. Sinuses/Orbits: No acute finding. Other: None. IMPRESSION: Mild chronic white matter ischemic changes. No acute abnormality noted. Electronically Signed   By: Inez Catalina M.D.   On: 07/29/2021 02:23   MR BRAIN WO CONTRAST  Result Date: 07/29/2021 CLINICAL DATA:  Neuro deficit with acute stroke suspected EXAM: MRI HEAD WITHOUT CONTRAST TECHNIQUE: Multiplanar, multiecho pulse sequences of the brain and surrounding structures were obtained without intravenous contrast. COMPARISON:  Head CT from earlier today FINDINGS: Brain: Extensive acute infarction in the right cerebellum, most confluent inferiorly. Nodulus infarct and patchy left occipital cortex infarction. No acute infarct in the anterior circulation. Patchy FLAIR hyperintensity in the cerebral white matter usually from chronic small vessel ischemia. No hemorrhage, hydrocephalus, or masslike finding. Vascular: Major flow voids are preserved Skull and upper cervical spine: Normal marrow signal Sinuses/Orbits: Negative IMPRESSION: Acute infarcts in the right cerebellum and left occipital cortex suggesting posterior circulation emboli. Patent fourth ventricle. Moderate chronic white matter disease. Electronically Signed   By: Jorje Guild M.D.   On: 07/29/2021 08:28   ECHOCARDIOGRAM COMPLETE BUBBLE STUDY  Result Date: 07/30/2021    ECHOCARDIOGRAM REPORT   Patient Name:   [name] Date of Exam: 07/30/2021 Medical Rec #:  [id number]     Height:       63.5 in Accession #:    [id number]    Weight:       149.0 lb Date of Birth:  10/10/1960     BSA:          1.716 m Patient Age:    61 years      BP:           121/58 mmHg Patient Gender: F             HR:           56 bpm. Exam Location:  Inpatient Procedure: 2D Echo, 3D Echo, Cardiac Doppler, Color Doppler, Strain Analysis and            Saline  Contrast Bubble Study Indications:    Stroke 434.91 / I63.9  History:        Patient has no prior history of  Echocardiogram examinations.                 Risk Factors:Dyslipidemia.  Sonographer:    Darlina Sicilian RDCS Referring Phys: [place]  1. Left ventricular ejection fraction, by estimation, is 55 to 60%. Left ventricular ejection fraction by 3D volume is 58 %. The left ventricle has normal function. The left ventricle has no regional wall motion abnormalities. Left ventricular diastolic  parameters were normal.  2. Right ventricular systolic function is normal. The right ventricular size is normal. There is normal pulmonary artery systolic pressure. The estimated right ventricular systolic pressure is 91.5 mmHg.  3. The mitral valve is normal in structure. No evidence of mitral valve regurgitation.  4. The aortic valve is tricuspid. Aortic valve regurgitation is not visualized.  5. The inferior vena cava is normal in size with <50% respiratory variability, suggesting right atrial pressure of 8 mmHg.  6. Agitated saline contrast bubble study was negative, with no evidence of any interatrial shunt. Comparison(s): No prior Echocardiogram. FINDINGS  Left Ventricle: Left ventricular ejection fraction, by estimation, is 55 to 60%. Left ventricular ejection fraction by 3D volume is 58 %. The left ventricle has normal function. The left ventricle has no regional wall motion abnormalities. The left ventricular internal cavity size was normal in size. There is no left ventricular hypertrophy. Left ventricular diastolic parameters were normal. Right Ventricle: The right ventricular size is normal. No increase in right ventricular wall thickness. Right ventricular systolic function is normal. There is normal pulmonary artery systolic pressure. The tricuspid regurgitant velocity is 1.93 m/s, and  with an assumed right atrial pressure of 8 mmHg, the estimated right ventricular systolic pressure is  05.6 mmHg. Left Atrium: Left atrial size was normal in size. Right Atrium: Right atrial size was normal in size. Pericardium: There is no evidence of pericardial effusion. Mitral Valve: The mitral valve is normal in structure. No evidence of mitral valve regurgitation. Tricuspid Valve: The tricuspid valve is grossly normal. Tricuspid valve regurgitation is trivial. Aortic Valve: The aortic valve is tricuspid. Aortic valve regurgitation is not visualized. Pulmonic Valve: The pulmonic valve was normal in structure. Pulmonic valve regurgitation is not visualized. Aorta: The aortic root and ascending aorta are structurally normal, with no evidence of dilitation. Venous: The inferior vena cava is normal in size with less than 50% respiratory variability, suggesting right atrial pressure of 8 mmHg. IAS/Shunts: No atrial level shunt detected by color flow Doppler. Agitated saline contrast was given intravenously to evaluate for intracardiac shunting. Agitated saline contrast bubble study was negative, with no evidence of any interatrial shunt.  LEFT VENTRICLE PLAX 2D LVIDd:         4.20 cm         Diastology LVIDs:         3.00 cm         LV e' medial:    10.60 cm/s LV PW:         0.70 cm         LV E/e' medial:  6.8 LV IVS:        0.80 cm         LV e' lateral:   14.60 cm/s LVOT diam:     1.70 cm         LV E/e' lateral: 4.9 LV SV:         46 LV SV Index:   27 LVOT Area:     2.27 cm  3D Volume EF                                LV 3D EF:    Left                                             ventricul                                             ar                                             ejection                                             fraction                                             by 3D                                             volume is                                             58 %.                                 3D Volume EF:                                3D EF:        58 %                                 LV EDV:       94 ml                                LV ESV:       39 ml                                LV SV:        54 ml RIGHT VENTRICLE RV S prime:     11.90 cm/s TAPSE (M-mode): 2.3 cm LEFT ATRIUM             Index        RIGHT ATRIUM  Index LA diam:        2.80 cm 1.63 cm/m   RA Area:     11.50 cm LA Vol (A2C):   30.5 ml 17.77 ml/m  RA Volume:   24.20 ml  14.10 ml/m LA Vol (A4C):   25.6 ml 14.91 ml/m LA Biplane Vol: 29.2 ml 17.01 ml/m  AORTIC VALVE LVOT Vmax:   89.40 cm/s LVOT Vmean:  64.100 cm/s LVOT VTI:    0.202 m  AORTA Ao Root diam: 2.50 cm MITRAL VALVE               TRICUSPID VALVE MV Area (PHT): 6.71 cm    TR Peak grad:   14.9 mmHg MV Decel Time: 113 msec    TR Vmax:        193.00 cm/s MV E velocity: 71.60 cm/s MV A velocity: 38.10 cm/s  SHUNTS MV E/A ratio:  1.88        Systemic VTI:  0.20 m                            Systemic Diam: 1.70 cm Lyman Bishop MD Electronically signed by Lyman Bishop MD Signature Date/Time: 07/30/2021/1:54:04 PM    Final     Labs:  Basic Metabolic Panel: No results for input(s): NA, K, CL, CO2, GLUCOSE, BUN, CREATININE, CALCIUM, MG, PHOS in the last 168 hours.   CBC: No results for input(s): WBC, NEUTROABS, HGB, HCT, MCV, PLT in the last 168 hours.   CBG: Recent Labs  Lab 08/10/21 1708 08/11/21 0625 08/12/21 0619 08/13/21 0605 08/14/21 0600  GLUCAP 160* 92 94 90 95   Family history.  Father with diabetes hypertension CAD and hyperlipidemia as well as CVA.  Denies any colon cancer esophageal cancer or rectal cancer  Brief HPI:   [name] is a 61 y.o. right-handed female with history of hyperlipidemia as well as prediabetes and tobacco use.  Patient on no prescription medications.  Per chart review lives with mother.  Presented 07/28/2021 with dizziness unsteady gait bouts of vomiting.  She had initially been seen at the urgent care 07/28/2021 for headache nausea vomiting low-grade fever without relief using BC powder.  CT/MRI  showed acute right cerebellum and left occipital cortex infarction.  Initially placed on hypertonic saline.  CT angiogram head and neck high-grade narrowing of the nondominant right vertebral artery measuring 70% of the origin and even greater at the V2 segment 55% narrowing at the left vertebral origin.  Patient did not receive tPA.  Echocardiogram with ejection fraction of 55 to 60% no wall motion abnormalities.  Admission chemistries unremarkable troponin negative hemoglobin A1c 6.1.  Neurology consulted placed on low-dose aspirin.  Latest follow-up cranial CT scan 08/01/2021 showed stable no hemorrhagic transformation no new intracranial abnormality.  Therapy evaluations completed due to patient decreased functional mobility was admitted for a comprehensive rehab program.   Hospital Course: [name] was admitted to rehab 08/03/2021 for inpatient therapies to consist of PT, ST and OT at least three hours five days a week. Past admission physiatrist, therapy team and rehab RN have worked together to provide customized collaborative inpatient rehab.  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  Prediabetes hemoglobin A1c 6.1 diabetic teaching.  Patient mildly overweight BMI 27.03 dietary follow-up.  Vitamin D deficiency with weekly vitamin D added.  Bouts  of constipation resolved with laxative assistance.  Bouts of dizziness blood pressure controlled added meclizine as needed.   Blood pressures were monitored on TID basis and controlled     Rehab course: During patient's stay in rehab weekly team conferences were held to monitor patient's progress, set goals and discuss barriers to discharge. At admission, patient required min guard 150 feet rolling walker minimal assist sit to stand  Physical exam.  Blood pressure 135/69 pulse 56 temperature 97.9  respirations 16 oxygen saturations 100% room air Constitutional.  No acute distress HEENT Head.  Normocephalic and atraumatic Eyes.  Pupils round and reactive to light no discharge.nystagmus Neck.  Supple nontender no JVD without thyromegaly Cardiac regular rate and rhythm any extra sounds or murmur heard Abdomen.  Soft nontender positive bowel sounds without rebound Respiratory effort normal no respiratory distress without wheeze Musculoskeletal.  Normal range of motion. Skin.  Warm and dry Neurologic.  Alert makes eye contact with examiner follows commands provides name and age fair insight and awareness.  Cranial nerve exam unremarkable.  Strength 4-5/5 in all fours.  Sensation intact to light touch  He/She  has had improvement in activity tolerance, balance, postural control as well as ability to compensate for deficits. He/She has had improvement in functional use RUE/LUE  and RLE/LLE as well as improvement in awareness.  Ambulating 220 feet rolling walker close supervision.  She ambulated with rolling walker and supervision to the bathroom.  Worked with standing balance standing endurance.  Contact-guard for standing balance.  Minimal assist lower body bathing supervision upper body dressing minimal assist lower body dressing.  Patient followed complex written directions with moderate assist understanding conditional if then statements.  Required visual cues and some verbal cues status post segmenting information to increase processing of written information.  It was discussed with family need for supervision for safety.  Full family teaching completed and discharged home       Disposition: Discharged home    Diet: Carb modified  Special Instructions: No driving smoking or alcohol  Medications at discharge 1.  Tylenol as needed 2.  Aspirin 81 mg p.o. daily 3.  Magnesium gluconate 500 mg p.o. nightly 4.  Antivert 12.5 mg p.o. twice daily as needed dizziness 5.  Crestor 20 mg p.o.  daily 6.  Vitamin D 50,000 units every 7 days  30-35 minutes were spent completing discharge summary and discharge planning  Discharge Instructions     Ambulatory referral to Neurology   Complete by: As directed    An appointment is requested in approximately: 4 weeks right cerebellar infarct left occipital cortex   Ambulatory referral to Physical Medicine Rehab   Complete by: As directed    Moderate complexity follow-up 1 to 2 weeks right cerebellar occipital infarction        Follow-up Information     Raulkar, Clide Deutscher, MD Follow up.   Specialty: Physical Medicine and Rehabilitation Why: 2/27 10:40am Contact information: [address] [address] [phone]                 Signed: Cathlyn Parsons 08/15/2021, 5:24 AM

## 2021-08-13 NOTE — Progress Notes (Signed)
Occupational Therapy Session Note  Patient Details  Name: [name] MRN: [id number] Date of Birth: 01-31-1961  Today's Date: 08/13/2021 OT Individual Time: 0900-1010 OT Individual Time Calculation (min): 70 min    Short Term Goals: Week 1:  OT Short Term Goal 1 (Week 1): Pt will complete LB bathing at min guard assist sit to stand for two consecutive sessions. OT Short Term Goal 1 - Progress (Week 1): Met OT Short Term Goal 2 (Week 1): Pt will complete LB dressing with min guard assist sit to stand for two consecutive sessions. OT Short Term Goal 2 - Progress (Week 1): Met OT Short Term Goal 3 (Week 1): Pt will complete toilet transfers with use of the LRAD and elevated toilet with min guard assist. OT Short Term Goal 3 - Progress (Week 1): Met OT Short Term Goal 4 (Week 1): Pt will complete tub/shower transfer with min guard assist using the RW and tub bench. OT Short Term Goal 4 - Progress (Week 1): Met Week 2:  OT Short Term Goal 1 (Week 2): STGs=LTGs due to ELOS  Skilled Therapeutic Interventions/Progress Updates:    Pt sitting up in recliner, no c/o pain, eager to participate with skilled OT.  Pt did mention that she has an appointment with neuro psych today and reports she has been crying intermittently out of nowhere.  Recommended to relay this to neuro psych MD for further assessment and treatment.  Pt completed all functional mobility with distant supervision including sit<>stand from recliner and armless chair and toilet, ambulation within room and in hallway without AD approximately 50 feet room<>dayroom, and 10x10 feet table to table to retrieve and place food picture labels during therapeutic activity.  Pt completed picture label categorizing task in 2 minutes 30 seconds which is significantly faster than last completed at 5 minutes 15 seconds and this time completed at higher level of difficulty without AD versus last time completed using RW.  Pt completed time word retrieval  using category prompt and specified starting letter. Pt instructed to recall as many words as possible in one minute.  Completed 4 sets of this task and pt was able to recall 2-3 words for each category in one minute.  Returned to room and direct hand off to neuro psych.    Therapy Documentation Precautions:  Precautions Precautions: Fall Precaution Comments: L coordination deficit, mild trunk ataxia, Bil TED hose Restrictions Weight Bearing Restrictions: No    Therapy/Group: Individual Therapy  Ezekiel Slocumb 08/13/2021, 12:41 PM

## 2021-08-13 NOTE — Progress Notes (Signed)
Physical Therapy Session Note  Patient Details  Name: [name] MRN: [id number] Date of Birth: April 13, 1961  Today's Date: 08/13/2021 PT Individual Time: 0800-0900 PT Individual Time Calculation (min): 60 min   Short Term Goals: Week 2:   STG = LTG due to ELOS  Skilled Therapeutic Interventions/Progress Updates:    Pt met sitting EOB, no pain, agreeable and ready for session w/ family present. Pt donned shoes independently and ambulated CGA to 88M rehab gym ~138f. 5xSTS x2 completed w/ S w/ times listed below. Pt cued to complete as fast and safely as possible for 2nd test. Pt completed ~449fwalking w/ head turns x2 CGA w/ slight dizziness that was relieved w/ rest. Dizziness less during session compared to previous sessions. Rebounder w/ light blue ball w/ dynamic balance on airex FSWA completed CGA 2x25. Ball switched to 1kg yellow ball and pt completed w/ narrow BoS on airex CGA 1x25. Tandem stance on airex CGA completed w/ bil UE reach forward and above head 1x5. Pt demonstrated appropriate hip and ankle strategies throughout rebounder activities. Quadraped alternating UE reach 2x5, alternating hip extension 2x5 completed CGA w/ seated rest break inbetween sets. R hip instability > L hip instability during hip extension in quadraped. During rest break, pt educated on BEFAST and given handout. Pt educated on d/c ambulation recommendations of S w/o AD in house and RW for long distance ambulation due to fatigue, and educated on sitting shoulder abduction to help decrease shoulder discomfort after activity. Pt voiced understanding and aggred w/ recommendation. Finished session w/ FSWA and narrow BoS balance on bosu ball in // bars x30s w/ CGA. Ball toss FSWA on bosu ball initiated and pt demonstrated appropriate hip and ankle strategies w/ light minA for hip stabilization. Pt ambulated w/ S back to room ~[id number] to chair, call bell next to pt and all needs met. Plan to completed d/c  planning and outcome measures next session.  5xSTS - 1-23s, 2-11s *5xSTS > 15s = increased falls risk   Therapy Documentation Precautions:  Precautions Precautions: Fall Precaution Comments: L coordination deficit, mild trunk ataxia, Bil TED hose Restrictions Weight Bearing Restrictions: No General:    Therapy/Group: Individual Therapy  ConLucile ShuttersPT  08/13/2021, 11:32 AM

## 2021-08-13 NOTE — Progress Notes (Signed)
Speech Language Pathology Daily Session Note  Patient Details  Name: [name] MRN: [id number] Date of Birth: July 26, 1960  Today's Date: 08/13/2021 SLP Individual Time: 1345-1445 SLP Individual Time Calculation (min): 60 min  Short Term Goals: Week 2: SLP Short Term Goal 1 (Week 2): STGs=LTGs due to ELOS  Skilled Therapeutic Interventions: Skilled ST treatment focused on cognitive goals. SLP facilitated session by providing overall min A fading to supervision A verbal cues for identification organization errors in daily BID pillbox, mod I for generating appropriate solutions, and sup A verbal cues for problem solving hypothetical scenarios pertaining to medication management. Provided patient with handouts containing complex deduction puzzles and scheduling tasks per pt request. Patient was left in recliner with alarm activated and immediate needs within reach at end of session. Continue per current plan of care.      Pain Pain Assessment Pain Scale: 0-10 Pain Score: 0-No pain  Therapy/Group: Individual Therapy  [name] 08/13/2021, 1:56 PM

## 2021-08-13 NOTE — Progress Notes (Signed)
Occupational Therapy Discharge Summary  Patient Details  Name: [name] MRN: [id number] Date of Birth: Dec 18, 1960  Today's Date: 08/14/2021 OT Individual Time: 1005-1100 OT Individual Time Calculation (min):55 min   Patient has met 12 of 12 long term goals due to improved activity tolerance, improved balance, postural control, ability to compensate for deficits, functional use of  LEFT upper and LEFT lower extremity, improved attention, improved awareness, and improved coordination.  Patient is now mod I for all basic self care and functional mobility and has progressed from needing RW to now no AE needed.  Pt has also increased skills in processing, problem solving, organizing, decision making.  Proprioception and motor control of LLE has improved and no longer scissors during gait as she did on initial evaluation. Patient to discharge at overall Modified Independent level.  Pt has been educated on BUE strengthening HEP using medium resistive band and is mod I using handout to complete due to mild weakness in LUE persisting.    Skilled Intervention:  Pt sitting up in recliner.  Ambulated in room and completed toilet transfer and toileting with independence.  Pt ambulated to large gym about 200 feet without AD with mod I.  Pt participated in multi step dual task with cognitive and mobility components.  Pt required occasional verbal cues for higher level problem solving to categorize, organize, and prioritize various labels and place in various places in gym. No LOB noted throughout ambulation including stand pivots in left and right direction and dynamic standing to reach and place labels at various heights.  Pt ambulated back to room and stand to sit at recliner with mod I.  Call bell in reach.    Reasons goals not met: NA  Recommendation:  No further skilled OT indicated at this time; patient will go to outpatient neuro for PT.  Equipment: RW; pt plans to purchase tub bench through her  work  Reasons for discharge: treatment goals met and discharge from hospital  Patient/family agrees with progress made and goals achieved: Yes  OT Discharge Precautions/Restrictions  Precautions Precautions: Fall Restrictions Weight Bearing Restrictions: No Pain Pain Assessment Pain Scale: 0-10 Pain Score: 0-No pain ADL ADL Eating: Independent Where Assessed-Eating: Chair Grooming: Independent Where Assessed-Grooming: Standing at sink Upper Body Bathing: Modified independent Where Assessed-Upper Body Bathing: Shower, Chair Lower Body Bathing: Supervision/safety Where Assessed-Lower Body Bathing: Shower, Chair Upper Body Dressing: Modified independent (Device) Where Assessed-Upper Body Dressing: Edge of bed Lower Body Dressing: Modified independent Where Assessed-Lower Body Dressing: Edge of bed Toileting: Modified independent Where Assessed-Toileting: Glass blower/designer: Programmer, applications Method: Counselling psychologist: Ambulance person Transfer: Distant supervision Tub/Shower Transfer Method: Optometrist: Facilities manager: Moderate assistance Social research officer, government Method: Heritage manager: Sports coach: Within Functional Limits Alignment/Gaze Preference: Within Defined Limits Tracking/Visual Pursuits: Decreased smoothness of horizontal tracking (from left returning to midline) Saccades: Within functional limits Convergence: Within functional limits Perception  Perception: Within Functional Limits Praxis Praxis: Intact Cognition Overall Cognitive Status: Impaired/Different from baseline Arousal/Alertness: Awake/alert Year: 2023 Month: January Day of Week: Correct Attention: Sustained;Focused;Selective Focused Attention: Appears intact Sustained Attention: Appears intact Selective Attention: Impaired Selective Attention Impairment:  Functional complex;Verbal complex Memory: Impaired Memory Impairment: Retrieval deficit Immediate Memory Recall: Sock;Blue;Bed Memory Recall Sock: Without Cue Memory Recall Blue: Without Cue Memory Recall Bed: With Cue Awareness: Appears intact Problem Solving: Impaired Problem Solving Impairment: Verbal complex;Functional complex Sequencing: Appears intact Organizing: Impaired Organizing Impairment:  Functional complex;Verbal complex Safety/Judgment: Appears intact Sensation Sensation Light Touch: Appears Intact Hot/Cold: Appears Intact Proprioception: Appears Intact Stereognosis: Appears Intact Coordination Gross Motor Movements are Fluid and Coordinated: Yes Fine Motor Movements are Fluid and Coordinated: Yes Finger Nose Finger Test: WNL Motor  Motor Motor: Within Functional Limits Mobility  Bed Mobility Bed Mobility: Supine to Sit;Sit to Supine Supine to Sit: Independent Sit to Supine: Independent Transfers Sit to Stand: Independent Stand to Sit: Independent  Trunk/Postural Assessment  Cervical Assessment Cervical Assessment: Within Functional Limits Thoracic Assessment Thoracic Assessment: Exceptions to [facility] (slightly rounded shoulders) Lumbar Assessment Lumbar Assessment: Within Functional Limits Postural Control Postural Control: Within Functional Limits  Balance Balance Balance Assessed: Yes Static Sitting Balance Static Sitting - Balance Support: No upper extremity supported;Feet supported Static Sitting - Level of Assistance: 7: Independent Dynamic Sitting Balance Dynamic Sitting - Balance Support: Feet supported;During functional activity Dynamic Sitting - Level of Assistance: 7: Independent Static Standing Balance Static Standing - Balance Support: During functional activity;No upper extremity supported Static Standing - Level of Assistance: 7: Independent Dynamic Standing Balance Dynamic Standing - Balance Support: During functional activity;No  upper extremity supported Dynamic Standing - Level of Assistance: 7: Independent Extremity/Trunk Assessment RUE Assessment RUE Assessment: Within Functional Limits LUE Assessment General Strength Comments: 4/5 left shoulder, elbow; 3+ wrist and hand   [name] 08/14/2021, 12:50 PM

## 2021-08-14 ENCOUNTER — Other Ambulatory Visit (HOSPITAL_COMMUNITY): Payer: Self-pay

## 2021-08-14 LAB — GLUCOSE, CAPILLARY: Glucose-Capillary: 95 mg/dL (ref 70–99)

## 2021-08-14 MED ORDER — MAGNESIUM GLUCONATE 500 MG PO TABS
250.0000 mg | ORAL_TABLET | Freq: Every day | ORAL | Status: DC
  Administered 2021-08-14: 250 mg via ORAL
  Filled 2021-08-14: qty 1

## 2021-08-14 MED ORDER — VITAMIN D (ERGOCALCIFEROL) 1.25 MG (50000 UNIT) PO CAPS
50000.0000 [IU] | ORAL_CAPSULE | ORAL | 0 refills | Status: DC
  Filled 2021-08-14: qty 5, 35d supply, fill #0

## 2021-08-14 MED ORDER — ROSUVASTATIN CALCIUM 20 MG PO TABS
20.0000 mg | ORAL_TABLET | Freq: Every day | ORAL | 0 refills | Status: DC
  Filled 2021-08-14: qty 30, 30d supply, fill #0

## 2021-08-14 MED ORDER — MAGNESIUM GLUCONATE 500 MG PO TABS
500.0000 mg | ORAL_TABLET | Freq: Every day | ORAL | 0 refills | Status: DC
  Filled 2021-08-14: qty 30, 30d supply, fill #0

## 2021-08-14 MED ORDER — ACETAMINOPHEN 325 MG PO TABS
650.0000 mg | ORAL_TABLET | Freq: Four times a day (QID) | ORAL | Status: AC | PRN
Start: 2021-08-14 — End: ?

## 2021-08-14 NOTE — Progress Notes (Signed)
Physical Therapy Session Note  Patient Details  Name: [name] MRN: [id number] Date of Birth: [birth year]  Today's Date: 08/14/2021 PT Individual Time: 1302-1327 PT Individual Time Calculation (min): 25 min   Short Term Goals: Week 1:  PT Short Term Goal 1 (Week 1): STG = LTG due to ELOS  Skilled Therapeutic Interventions/Progress Updates:     Pt received seated in [facility] and agrees to therapy. No complaint of pain. Pt performs sit to stand and ambulation to dayroom without AD and no balance deficits noted. Pt performs Berg Balance test and FGA, as detailed below, demonstrating clinically significant improvement in both. Pt ambulates back to room. Left seated in recliner with all needs within reach.  Therapy Documentation Precautions:  Precautions Precautions: Fall Precaution Comments: L coordination deficit, mild trunk ataxia, Bil TED hose Restrictions Weight Bearing Restrictions: No   Balance Balance Assessed: Yes Berg Balance Test Sit to Stand: Able to stand without using hands and stabilize independently Standing Unsupported: Able to stand safely 2 minutes Sitting with Back Unsupported but Feet Supported on Floor or Stool: Able to sit safely and securely 2 minutes Stand to Sit: Sits safely with minimal use of hands Transfers: Able to transfer safely, minor use of hands Standing Unsupported with Eyes Closed: Able to stand 10 seconds safely Standing Ubsupported with Feet Together: Able to place feet together independently and stand 1 minute safely From Standing, Reach Forward with Outstretched Arm: Can reach confidently >25 cm (10") From Standing Position, Pick up Object from Floor: Able to pick up shoe safely and easily From Standing Position, Turn to Look Behind Over each Shoulder: Looks behind one side only/other side shows less weight shift Turn 360 Degrees: Able to turn 360 degrees safely in 4 seconds or less Standing Unsupported, Alternately Place Feet on Step/Stool:  Able to stand independently and safely and complete 8 steps in 20 seconds Standing Unsupported, One Foot in Front: Able to place foot tandem independently and hold 30 seconds Standing on One Leg: Able to lift leg independently and hold equal to or more than 3 seconds Total Score: 53 Static Sitting Balance Static Sitting - Balance Support: No upper extremity supported;Feet supported Static Sitting - Level of Assistance: 7: Independent Dynamic Sitting Balance Dynamic Sitting - Balance Support: Feet supported;During functional activity Dynamic Sitting - Level of Assistance: 7: Independent Static Standing Balance Static Standing - Balance Support: During functional activity;No upper extremity supported Static Standing - Level of Assistance: 7: Independent Dynamic Standing Balance Dynamic Standing - Balance Support: During functional activity;No upper extremity supported Dynamic Standing - Level of Assistance: 7: Independent Functional Gait  Assessment Gait assessed : Yes Gait Level Surface: Walks 20 ft in less than 5.5 sec, no assistive devices, good speed, no evidence for imbalance, normal gait pattern, deviates no more than 6 in outside of the 12 in walkway width. Change in Gait Speed: Able to change speed, demonstrates mild gait deviations, deviates 6-10 in outside of the 12 in walkway width, or no gait deviations, unable to achieve a major change in velocity, or uses a change in velocity, or uses an assistive device. Gait with Horizontal Head Turns: Performs head turns smoothly with no change in gait. Deviates no more than 6 in outside 12 in walkway width Gait with Vertical Head Turns: Performs head turns with no change in gait. Deviates no more than 6 in outside 12 in walkway width. Gait and Pivot Turn: Pivot turns safely in greater than 3 sec and stops with no  loss of balance, or pivot turns safely within 3 sec and stops with mild imbalance, requires small steps to catch balance. Step Over  Obstacle: Is able to step over one shoe box (4.5 in total height) without changing gait speed. No evidence of imbalance. Gait with Narrow Base of Support: Ambulates 7-9 steps. Gait with Eyes Closed: Walks 20 ft, no assistive devices, good speed, no evidence of imbalance, normal gait pattern, deviates no more than 6 in outside 12 in walkway width. Ambulates 20 ft in less than 7 sec. Ambulating Backwards: Walks 20 ft, uses assistive device, slower speed, mild gait deviations, deviates 6-10 in outside 12 in walkway width. Steps: Alternating feet, must use rail. Total Score: 24   Therapy/Group: Individual Therapy  Breck Coons, PT, DPT 08/14/2021, 1:31 PM

## 2021-08-14 NOTE — Progress Notes (Signed)
Physical Therapy Session Note  Patient Details  Name: [name] MRN: [id number] Date of Birth: 1961/01/04  Today's Date: 08/14/2021 PT Individual Time: 0930-1000 PT Individual Time Calculation (min): 30 min   Short Term Goals: Week 2:     Skilled Therapeutic Interventions/Progress Updates:   Pt presents sitting EOB and agreeable to therapy.  Pt donned shoes independently at EOB.  Pt transfers sit to stand w/ mod I to RW.  Pt amb multiple trials w/ RW and supervision up to 350'.  Pt transferred in/out of car at sedan height w/ supervision.  Pt amb up/down ramped surface w/ supervision.  Pt negotiated 4 steps w/ B rails, and 8 steps w/ 1 rail (right side ascending) w/ step-to and step-through pattern.  Pt amb up to 120' w/o AD and supervision including turns to return to room.  Pt remained sitting in recliner w/ all needs in reach.  Pt able to amb in room independently.     Therapy Documentation Precautions:  Precautions Precautions: Fall Precaution Comments: L coordination deficit, mild trunk ataxia, Bil TED hose Restrictions Weight Bearing Restrictions: No General:   Vital Signs:  Pain:0/10 Pain Assessment Pain Scale: 0-10 Pain Score: 0-No pain Mobility:    Therapy/Group: Individual Therapy  Ladoris Gene 08/14/2021, 10:01 AM

## 2021-08-14 NOTE — Progress Notes (Signed)
Inpatient Rehabilitation Care Coordinator Discharge Note   Patient Details  Name: [name] MRN: [id number] Date of Birth: 10/09/1960   Discharge location: HOME WITH MOM WHO CAN BE THERE BUT NOT ASSIST-DUE TO [place]  Length of Stay:  12 DAYS  Discharge activity level: MOD/I LEVEL  Home/community participation: ACTIVE  Patient response [id number] Literacy - How often do you need to have someone help you when you read instructions, pamphlets, or other written material from your doctor or pharmacy?: Never  Patient response [id number] Isolation - How often do you feel lonely or isolated from those around you?: Never  Services provided included: MD, RD, PT, OT, SLP, RN, CM, TR, Neuropsych, SW  Financial Services:  Charity fundraiser Utilized: [employer]  Choices offered to/list presented to: PT  Follow-up services arranged:  Outpatient, DME, Patient/Family has no preference for HH/DME agencies    Outpatient Servicies: [facility]-PT, OT, SP WILL CALL PT TO SET UP FOLLOW UP APPOINTMENTS DME : [facility]    Patient response to transportation need: Is the patient able to respond to transportation needs?: Yes In the past 12 months, has lack of transportation kept you from medical appointments or from getting medications?: Yes In the past 12 months, has lack of transportation kept you from meetings, work, or from getting things needed for daily living?: Yes    Comments (or additional information):PT DID VERY WELL AND 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

## 2021-08-14 NOTE — Progress Notes (Signed)
PROGRESS NOTE   Subjective/Complaints: Currently getting dressed Friend is here to visit her CBGs better controlled Appreciate pharmacy discharge med review  ROS: Patient denies fever, rash, sore throat, blurred vision, dizziness, nausea, vomiting, diarrhea, cough, shortness of breath or chest pain, joint or back/neck pain, headache, or mood change.    Objective:   No results found. No results for input(s): WBC, HGB, HCT, PLT in the last 72 hours.  No results for input(s): NA, K, CL, CO2, GLUCOSE, BUN, CREATININE, CALCIUM in the last 72 hours.   Intake/Output Summary (Last 24 hours) at 08/14/2021 1054 Last data filed at 08/13/2021 2300 Gross per 24 hour  Intake 480 ml  Output --  Net 480 ml        Physical Exam: Vital Signs Blood pressure (!) 104/46, pulse 63, temperature 97.6 F (36.4 C), temperature source Oral, resp. rate 18, height 5\' 3"  (1.6 m), weight 69.2 kg, last menstrual period 07/15/2009, SpO2 99 %. Gen: no distress, normal appearing HEENT: oral mucosa pink and moist, NCAT Cardio: Reg rate Chest: normal effort, normal rate of breathing Abd: soft, non-distended Ext: no edema Psych: pleasant, normal affect   Musculoskeletal:        left MT pain Skin:    General: Skin is warm and dry.     Comments: Long pink finger nails  Neurological:     Mental Status: She is alert.     Comments: Patient is alert and orientedx4.  No acute distress.  Makes eye contact with examiner.  Follows commands.  Provides name and age.  Fair insight and awareness. Alert and oriented x 3. Normal insight and awareness. Intact Memory. Impaired problem solving. Normal language and speech. Cranial nerve exam unremarkable. Good sitting balance. Strength 4+/5 in all 4's. Sensation intact to LT and pain in all 4's. Decreased FTN, FMC RUE>LUE. DTR's 2+      Assessment/Plan: 1. Functional deficits which require 3+ hours per day of  interdisciplinary therapy in a comprehensive inpatient rehab setting. Physiatrist is providing close team supervision and 24 hour management of active medical problems listed below. Physiatrist and rehab team continue to assess barriers to discharge/monitor patient progress toward functional and medical goals  Care Tool:  Bathing    Body parts bathed by patient: Right arm, Left arm, Chest, Abdomen, Front perineal area, Buttocks, Right upper leg, Left upper leg, Right lower leg, Left lower leg, Face         Bathing assist Assist Level: Minimal Assistance - Patient > 75%     Upper Body Dressing/Undressing Upper body dressing   What is the patient wearing?: Pull over shirt    Upper body assist Assist Level: Set up assist    Lower Body Dressing/Undressing Lower body dressing      What is the patient wearing?: Underwear/pull up     Lower body assist Assist for lower body dressing: Contact Guard/Touching assist     Toileting Toileting    Toileting assist Assist for toileting: Minimal Assistance - Patient > 75%     Transfers Chair/bed transfer  Transfers assist     Chair/bed transfer assist level: Supervision/Verbal cueing     Locomotion Ambulation  Ambulation assist      Assist level: Supervision/Verbal cueing Assistive device: Walker-rolling Max distance: 350   Walk 10 feet activity   Assist     Assist level: Supervision/Verbal cueing Assistive device: Walker-rolling   Walk 50 feet activity   Assist    Assist level: Supervision/Verbal cueing Assistive device: Walker-rolling    Walk 150 feet activity   Assist    Assist level: Supervision/Verbal cueing Assistive device: Walker-rolling    Walk 10 feet on uneven surface  activity   Assist Walk 10 feet on uneven surfaces activity did not occur: Safety/medical concerns   Assist level: Supervision/Verbal cueing Assistive device: Walker-rolling   Wheelchair     Assist Is the  patient using a wheelchair?: Yes Type of Wheelchair: Manual    Wheelchair assist level: Total Assistance - Patient < 25% Max wheelchair distance: 200 ft    Wheelchair 50 feet with 2 turns activity    Assist        Assist Level: Total Assistance - Patient < 25%   Wheelchair 150 feet activity     Assist      Assist Level: Total Assistance - Patient < 25%   Blood pressure (!) 104/46, pulse 63, temperature 97.6 F (36.4 C), temperature source Oral, resp. rate 18, height 5\' 3"  (1.6 m), weight 69.2 kg, last menstrual period 07/15/2009, SpO2 99 %.  Medical Problem List and Plan: 1. 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  Antithrombotics: -DVT/anticoagulation:  Mechanical:  Antiembolism stockings, knee (TED hose) Bilateral lower extremities             -antiplatelet therapy: Aspirin 81 mg daily 3. Pain Management: Tylenol as needed 4. Mood: Provide emotional support             -antipsychotic agents: N/A 5. Neuropsych: This patient is capable of making decisions on her own behalf. 6. Skin/Wound Care: Routine skin checks 7. Fluids/Electrolytes/Nutrition: Routine in and outs with follow-up chemistries 8.  Cognitive deficits: continue SLP 9.  Prediabetes.  Hemoglobin A1c 6.1.  SSI discontinued. Provide list of foods to help improve insulin sensitivity. Commended on improvements in CBGs! Continue carb modified diet. Recommended stopping soda/diet soda.  10. Overweight: BMI 27.03: provide dietary education.  11. Hypernatremia: normalized 12. Bradycardia: continue to monitor TID 13. HLD: LDL 61. Continue Crestor 20mg . Recommend Coenzyme q10. Discussed that this can help mitigate the negative  effects of the statin.  14. Vitamin D deficiency: start ergocalciferol 50,000U once per week for 7 weeks 15. Suboptimal magnesium levels: decrease magnesium to 250mg  given hypotension.  16. Constipation: BM 1/29. Decrease senna-docusate to PRN. D/c miralax.  17. 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 D/c myrebtriq.  18. Dizziness: d/c meclizine. Resolved with drinking more water. Encourage 6-8 glasses of water per day 19. Cognitive impairment: SLP notes reviewed, improving.  20. Hypotension: decrease magnesium to 250mg  HS    LOS: 11 days A FACE TO FACE EVALUATION WAS PERFORMED  Martha Clan P Willine Schwalbe 08/14/2021, 10:54 AM

## 2021-08-14 NOTE — Progress Notes (Signed)
Occupational Therapy Session Note  Patient Details  Name: [name] MRN: [id number] Date of Birth: [birth year]  Today's Date: 08/14/2021 OT Individual Time: 1345-1414 OT Individual Time Calculation (min): 29 min    Short Term Goals: Week 1:  OT Short Term Goal 1 (Week 1): Pt will complete LB bathing at min guard assist sit to stand for two consecutive sessions. OT Short Term Goal 1 - Progress (Week 1): Met OT Short Term Goal 2 (Week 1): Pt will complete LB dressing with min guard assist sit to stand for two consecutive sessions. OT Short Term Goal 2 - Progress (Week 1): Met OT Short Term Goal 3 (Week 1): Pt will complete toilet transfers with use of the LRAD and elevated toilet with min guard assist. OT Short Term Goal 3 - Progress (Week 1): Met OT Short Term Goal 4 (Week 1): Pt will complete tub/shower transfer with min guard assist using the RW and tub bench. OT Short Term Goal 4 - Progress (Week 1): Met Week 2:  OT Short Term Goal 1 (Week 2): STGs=LTGs due to ELOS   Skilled Therapeutic Interventions/Progress Updates:    Pt greeted at time of session 16 minutes late 2/2 previous pt care. Pt up in chair and agreeable to OT session, aware that she is Indep in the room. Discussion at beginning of session regarding assist with IADLs such as grocery shopping through online ordering to decrease need to drive when going home. Pt ambulated chair > bathroom Indep and all toileting tasks same manner before walking to main gym Indep as well. Focused on dynamic standing balance ambulating throughout hall and locating #1-12 dots scanning environment with good accuracy. Picking up washcloths from ground level to simualte home environment and picking up items, 7/7 with indep and no LOB. Ambulating back to room following stop/go to improve righting reactions and standing balance, no LOB during this task either. Back in room set up call bell in reach all needs met.     Therapy  Documentation Precautions:  Precautions Precautions: Fall Precaution Comments: L coordination deficit, mild trunk ataxia, Bil TED hose Restrictions Weight Bearing Restrictions: No    Therapy/Group: Individual Therapy  Viona Gilmore 08/14/2021, 12:55 PM

## 2021-08-14 NOTE — Progress Notes (Signed)
Patient ID: [name], female   DOB: [birth year], 61 y.o.   MRN: [id number] Gave pt information for [employer] to pay her co-pays so rolling walker and tub bench can be delivered to her room prior to discharge. Pt is doing well and made good progress and feels ready to go home tomorrow. OP referral faxed and pt aware will call her to set up follow up appointments.

## 2021-08-14 NOTE — Consult Note (Signed)
Neuropsychological Consultation   Patient:   [name]   DOB:   12/22/1960  MR Number:  [id number]  Location:  [place] [id number] [place] [address] Dept: [place]: [phone]           Date of Service:   08/13/2021  Start Time:   10 AM End Time:   11 AM  Provider/Observer:  Ilean Skill, Psy.D.       Clinical Neuropsychologist       Billing Code/Service: [phone]  Chief Complaint:    [name] is a 61 year old female with a history of hyperlipidemia, prediabetic and tobacco use.  Patient presented on 07/28/2021 with dizziness, unsteady gait and bouts of vomiting with low-grade fever.  Patient was initially seen at urgent care on 1/14 with headache nausea and vomiting and low-grade fever without relief from aspirin.  CT/MRI showed acute infarct right cerebellum with left occipital cortex involvement.  Patient did not receive tPA.  Reason for Service:  Patient was referred for neuropsychological consultation due to coping and adjustment with residual effects of her cerebellar stroke.  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  Below is the HPI for the current admission.  HPI: [name] is a 61 year old right-handed female with history of hyperlipidemia as well as prediabetes, tobacco use.  Patient on no prescription medications.  Per chart review patient lives with her mother.  1 level apartment.  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  Presented 07/28/2021 with dizziness, unsteady gait and bouts of vomiting.  She had initially been seen at the urgent care 07/28/2021 for headache nausea vomiting and low-grade fever without relief with BC powder.  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  She was  initially placed on hypertonic saline.  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  55% narrowing at the left vertebral origin.  Patient did not receive tPA.  Echocardiogram with ejection fraction of 55 to 60% no wall motion abnormalities.  Admission chemistries unremarkable except glucose 125, troponin negative, hemoglobin A1c 6.1, urinalysis negative nitrite.  Neurology follow-up currently maintained on low-dose aspirin for CVA prophylaxis.  Latest follow-up cranial CT scan 08/01/2021 stable no hemorrhagic transformation no new intracranial abnormality.  Tolerating a regular diet.  Therapy evaluations completed due to patient decreased functional mobility was admitted for a comprehensive rehab program.  Current Status:  Patient was awake and alert with good mental status and cognition.  Patient was aware of what it happened to her as far as her stroke and has been making significant improvements on the inpatient unit.  The patient has been walking and moving around and working on gait and recognizing and being more thoughtful and cognizant of her surroundings due to residual effects of her cerebellar stroke.  Patient reports that she is continuing to make significant improvements overall and most of her symptoms appear to be directly related to cerebellar involvement.  Patient denies any significant visual changes and denies any symptoms of visual neglect or primary visual disturbance.   Behavioral Observation: [name]  presents as a 61 y.o.-year-old Right handed Caucasian Female who appeared her stated age. her  dress was Appropriate and she was Well Groomed and her manners were Appropriate to the situation.  her participation was indicative of Appropriate and Attentive behaviors.  There were physical disabilities noted.  she displayed an appropriate level of cooperation and motivation.      Interactions:    Active Appropriate and Attentive  Attention:   within normal limits and attention span and concentration were age appropriate  Memory:   within normal limits; recent and remote memory intact  Visuo-spatial:  abnormal for primary related to cerebellar involvement and deficits in proprioception  Speech (Volume):  normal  Speech:   normal; normal  Thought Process:  Coherent and Relevant  Though Content:  WNL; not suicidal and not homicidal  Orientation:   person, place, time/date, and situation  Judgment:   Good  Planning:   Good  Affect:    Appropriate  Mood:    Euthymic  Insight:   Good  Intelligence:   high  Medical History:   Past Medical History:  Diagnosis Date   Blood in stool    Hyperlipidemia    Vaginal fibroids          Patient Active Problem List   Diagnosis Date Noted   Acute ischemic left PCA stroke ([place]) 08/03/2021   Cerebellar infarction ([place]) 07/30/2021   Cerebral edema ([place]) 07/30/2021   Hyperlipidemia 07/30/2021   Prediabetes 07/30/2021    Psychiatric History:  No prior psychiatric history  Family Med/Psych History:  Family History  Problem Relation Age of Onset   Diabetes Father    Hypertension Father    Heart disease Father    Hyperlipidemia Father    Stroke Father    Cancer Neg Hx     Impression/DX:   Chief Complaint:    [name] is a 61 year old female with a history of hyperlipidemia, prediabetic and tobacco use.  Patient presented on 07/28/2021 with dizziness, unsteady gait and bouts of vomiting with low-grade fever.  Patient was initially seen at urgent care on 1/14 with headache nausea and vomiting and low-grade fever without relief from aspirin.  CT/MRI showed acute infarct right cerebellum with left occipital cortex involvement.  Patient did not receive tPA.  Patient was awake and alert with good mental status and cognition.  Patient was aware of what it happened to her as far as her stroke and has  been making significant improvements on the inpatient unit.  The patient has been walking and moving around and working on gait and recognizing and being more thoughtful and cognizant of her surroundings due to residual effects of her cerebellar stroke.  Patient reports that she is continuing to make significant improvements overall and most of her symptoms appear to be directly related to cerebellar involvement.  Patient denies any significant visual changes and denies any symptoms of visual neglect or primary visual disturbance.  Disposition/Plan:  Today we worked on coping and adjustment issues particularly noting issues of what to expect and cope/manage with her discharge that will be happening on Wednesday (08/15/2021).  Diagnosis:    Cerebellar infarction [facility]) - Plan: Ambulatory referral to Neurology, Ambulatory referral to Physical Medicine Rehab         Electronically Signed   _______________________ Ilean Skill, Psy.D. Clinical Neuropsychologist

## 2021-08-14 NOTE — Progress Notes (Addendum)
Speech Language Pathology Discharge Summary  Patient Details  Name: [name] MRN: [id number] Date of Birth: 11-11-1960  Today's Date: 08/14/2021 SLP Individual Time: 1445-1530 SLP Individual Time Calculation (min): 45 min  Skilled Therapeutic Interventions:  Skilled ST treatment focused on cognitive goals. Upon arrival, pt was using her phone to pay auto insurance bill and completed task independently. SLP facilitated session by providing overall min A fading to supervision A verbal cues for complex problem solving/deductive reasoning tasks. Pt with good carry over of organization strategies throughout and ability to alternate attention between multiple points. Patient was left in recliner with immediate needs within reach at end of session.  Patient has met 2 of 3 long term goals.  Patient to discharge at overall Supervision;Modified Independent;Min level.   Reasons goals not met: pt supervision-to-min A for complex problem solving   Clinical Impression/Discharge Summary: Patient has made functional gains and has met 2 of 3 long-term goals this admission due to improved cognitive-linguistic function. Patient is currently an overall modified independent for functional recall with use of compensatory memory strategies, and attention skills. Pt requires supervision-to-min A for complex cognitive tasks and is steadily progressing in this area. Patient education is complete and patient to discharge at overall mod I-to-min A level. Patient's care partner is independent to provide the necessary physical and cognitive assistance at discharge. Patient would benefit from continued SLP services in outpatient setting to maximize cognitive function and functional independence.    Care Partner:  Caregiver Able to Provide Assistance: Yes  Type of Caregiver Assistance: Cognitive;Physical  Recommendation:  Outpatient SLP  Rationale for SLP Follow Up: Maximize cognitive function and independence    Equipment: None   Reasons for discharge: Treatment goals met;Change in medical status   Patient/Family Agrees with Progress Made and Goals Achieved: Yes    Tanner Yeley T Adasia Hoar 08/14/2021, 4:16 PM

## 2021-08-14 NOTE — Progress Notes (Signed)
Inpatient Rehabilitation Discharge Medication Review by a Pharmacist  A complete drug regimen review was completed for this patient to identify any potential clinically significant medication issues.  High Risk Drug Classes Is patient taking? Indication by Medication  Antipsychotic No   Anticoagulant No   Antibiotic No   Opioid No   Antiplatelet Yes Aspirin for CVA ppx  Hypoglycemics/insulin No   Vasoactive Medication No   Chemotherapy No   Other Yes Crestor for HLD     Type of Medication Issue Identified Description of Issue Recommendation(s)  Drug Interaction(s) (clinically significant)     Duplicate Therapy     Allergy     No Medication Administration End Date     Incorrect Dose     Additional Drug Therapy Needed     Significant med changes from prior encounter (inform family/care partners about these prior to discharge).    Other       Clinically significant medication issues were identified that warrant physician communication and completion of prescribed/recommended actions by midnight of the next day:  No  Pharmacist comments: None  Time spent performing this drug regimen review (minutes):  20 minutes   [name] BS, PharmD, BCPS Clinical Pharmacist 08/14/2021 10:28 AM

## 2021-08-14 NOTE — Plan of Care (Signed)
Problem: RH Problem Solving Goal: LTG Patient will demonstrate problem solving for (SLP) Description: LTG:  Patient will demonstrate problem solving for basic/complex daily situations with cues  (SLP) Outcome: Not Met (add Reason) Note: Pt requiring sup-to-min A   Problem: RH Memory Goal: LTG Patient will demonstrate ability for day to day (SLP) Description: LTG:   Patient will demonstrate ability for day to day recall/carryover during cognitive/linguistic activities with assist  (SLP) Outcome: Completed/Met   Problem: RH Attention Goal: LTG Patient will demonstrate this level of attention during functional activites (SLP) Description: LTG:  Patient will will demonstrate this level of attention during functional activites (SLP) Outcome: Completed/Met

## 2021-08-14 NOTE — Plan of Care (Addendum)
I have read and reviewed the attached note and am in agreement with the documentation provided.           This licensed clinician was present and actively directing care throughout the  session at all times.        Judieth Keens PT, DPT      Problem: RH Balance Goal: LTG Patient will maintain dynamic sitting balance (PT) Description: LTG:  Patient will maintain dynamic sitting balance with assistance during mobility activities (PT) Outcome: Completed/Met Flowsheets (Taken 08/14/2021 1636) LTG: Pt will maintain dynamic sitting balance during mobility activities with:: Independent Goal: LTG Patient will maintain dynamic standing balance (PT) Description: LTG:  Patient will maintain dynamic standing balance with assistance during mobility activities (PT) Outcome: Completed/Met Flowsheets (Taken 08/14/2021 1636) LTG: Pt will maintain dynamic standing balance during mobility activities with:: Supervision/Verbal cueing   Problem: Sit to Stand Goal: LTG:  Patient will perform sit to stand with assistance level (PT) Description: LTG:  Patient will perform sit to stand with assistance level (PT) Outcome: Completed/Met Flowsheets (Taken 08/14/2021 1636) LTG: PT will perform sit to stand in preparation for functional mobility with assistance level: Independent   Problem: RH Bed Mobility Goal: LTG Patient will perform bed mobility with assist (PT) Description: LTG: Patient will perform bed mobility with assistance, with/without cues (PT). Outcome: Completed/Met Flowsheets (Taken 08/14/2021 1636) LTG: Pt will perform bed mobility with assistance level of: Independent   Problem: RH Bed to Chair Transfers Goal: LTG Patient will perform bed/chair transfers w/assist (PT) Description: LTG: Patient will perform bed to chair transfers with assistance (PT). Outcome: Completed/Met Flowsheets (Taken 08/14/2021 1636) LTG: Pt will perform Bed to Chair Transfers with assistance level: Independent   Problem:  RH Car Transfers Goal: LTG Patient will perform car transfers with assist (PT) Description: LTG: Patient will perform car transfers with assistance (PT). Outcome: Completed/Met Flowsheets (Taken 08/14/2021 1636) LTG: Pt will perform car transfers with assist:: Supervision/Verbal cueing   Problem: RH Furniture Transfers Goal: LTG Patient will perform furniture transfers w/assist (OT/PT) Description: LTG: Patient will perform furniture transfers  with assistance (OT/PT). Outcome: Completed/Met Flowsheets (Taken 08/14/2021 1636) LTG: Pt will perform furniture transfers with assist:: Supervision/Verbal cueing   Problem: RH Ambulation Goal: LTG Patient will ambulate in controlled environment (PT) Description: LTG: Patient will ambulate in a controlled environment, # of feet with assistance (PT). Outcome: Completed/Met Flowsheets (Taken 08/14/2021 1636) LTG: Pt will ambulate in controlled environ  assist needed:: Supervision/Verbal cueing LTG: Ambulation distance in controlled environment: 129f no AD Goal: LTG Patient will ambulate in home environment (PT) Description: LTG: Patient will ambulate in home environment, # of feet with assistance (PT). Outcome: Completed/Met Flowsheets Taken 08/14/2021 1636 by DAlvira Philips Student-PT LTG: Ambulation distance in home environment: 50 ft no AD Taken 08/04/2021 1522 by KAlger Simons PT LTG: Pt will ambulate in home environ  assist needed:: Supervision/Verbal cueing   Problem: RH Stairs Goal: LTG Patient will ambulate up and down stairs w/assist (PT) Description: LTG: Patient will ambulate up and down # of stairs with assistance (PT) Outcome: Completed/Met Flowsheets (Taken 08/14/2021 1636) LTG: Pt will ambulate up/down stairs assist needed:: Supervision/Verbal cueing LTG: Pt will  ambulate up and down number of stairs: 8 steps

## 2021-08-14 NOTE — Progress Notes (Addendum)
Physical Therapy Discharge Summary  I have read and reviewed the attached note and am in agreement with the documentation provided.           This licensed clinician was present and actively directing care throughout the  session at all times.        Judieth Keens PT, DPT    Patient Details  Name: [name] MRN: [id number] Date of Birth: 1961/05/05  Today's Date: 08/14/2021   Patient has met 9 of 9 long term goals due to improved activity tolerance, improved balance, improved postural control, increased strength, improved awareness, and improved coordination.  Patient to discharge at an ambulatory level Modified Independent.   Patient's care partner is independent to provide the necessary physical assistance at discharge.  Reasons goals not met: N/A  Recommendation:  Patient will benefit from ongoing skilled PT services in outpatient setting to continue to advance safe functional mobility, address ongoing impairments in dynamic balance, CV endurance, LE strength, dual task activities and minimize fall risk to return to PLOF.  Pt had made significant progress during her rehab. She consistently demonstrated carryover from session to session, improved dynamic balance, endurance, and LE coordination during functional activities since start of therapy. Pt voiced and demonstrated understanding of PT recommendations once home and gradual return to work activities as appropriate.  Equipment: RW  Reasons for discharge: treatment goals met  Patient/family agrees with progress made and goals achieved: Yes  PT Discharge Precautions/Restrictions Precautions Precautions: Fall Restrictions Weight Bearing Restrictions: No Vital Signs Therapy Vitals Temp: 98.1 F (36.7 C) Temp Source: Oral Pulse Rate: 60 Resp: 16 BP: 104/60 Patient Position (if appropriate): Sitting Oxygen Therapy SpO2: 100 % O2 Device: Room Air Pain Pain Assessment Pain Scale: 0-10 Pain Score: 0-No pain Pain  Interference Pain Interference Pain Effect on Sleep: 0. Does not apply - I have not had any pain or hurting in the past 5 days Pain Interference with Therapy Activities: 0. Does not apply - I have not received rehabilitationtherapy in the past 5 days Pain Interference with Day-to-Day Activities: 1. Rarely or not at all Vision/Perception  Vision - History Ability to See in Adequate Light: 0 Adequate Vision - Assessment Eye Alignment: Within Functional Limits Alignment/Gaze Preference: Within Defined Limits Tracking/Visual Pursuits: Decreased smoothness of horizontal tracking (from left returning to midline) Saccades: Within functional limits Convergence: Within functional limits Perception Perception: Within Functional Limits Praxis Praxis: Intact  Cognition Overall Cognitive Status: Impaired/Different from baseline Arousal/Alertness: Awake/alert Orientation Level: Oriented X4 Year: 2023 Month: January Day of Week: Correct Attention: Sustained;Focused;Selective Focused Attention: Appears intact Sustained Attention: Appears intact Selective Attention: Impaired Selective Attention Impairment: Functional complex;Verbal complex Memory: Impaired Memory Impairment: Decreased recall of new information Decreased Short Term Memory: Verbal complex;Functional complex Immediate Memory Recall: Sock;Blue;Bed Memory Recall Sock: Without Cue Memory Recall Blue: Without Cue Memory Recall Bed: With Cue Awareness: Appears intact Problem Solving: Impaired Problem Solving Impairment: Verbal complex;Functional complex Sequencing: Appears intact Organizing: Impaired Organizing Impairment: Functional complex;Verbal complex Safety/Judgment: Appears intact Sensation Sensation Light Touch: Appears Intact Hot/Cold: Appears Intact Proprioception: Appears Intact Stereognosis: Appears Intact Coordination Gross Motor Movements are Fluid and Coordinated: Yes Fine Motor Movements are Fluid and  Coordinated: Yes Finger Nose Finger Test: WNL Motor  Motor Motor: Within Functional Limits  Mobility Bed Mobility Bed Mobility: Supine to Sit;Sit to Supine Supine to Sit: Independent Sit to Supine: Independent Transfers Transfers: Sit to Stand;Stand to [employer] Transfers Sit to Stand: Independent Stand to Sit: Independent Stand Pivot Transfers:  Independent Transfer (Assistive device): None Locomotion  Gait Ambulation: Yes Gait Assistance: Supervision/Verbal cueing Gait Distance (Feet): 350 Feet Assistive device: None Gait Gait Pattern: Within Functional Limits Stairs / Additional Locomotion Stairs: Yes Stairs Assistance: Supervision/Verbal cueing Stair Management Technique: Two rails Number of Stairs: 8 Ramp: Supervision/Verbal cueing Curb: Supervision/Verbal cueing Wheelchair Mobility Wheelchair Mobility: No  Trunk/Postural Assessment  Cervical Assessment Cervical Assessment: Within Functional Limits Thoracic Assessment Thoracic Assessment: Exceptions to [facility] (slightly rounded shoulders) Lumbar Assessment Lumbar Assessment: Within Functional Limits Postural Control Postural Control: Within Functional Limits  Balance Balance Balance Assessed: Yes Berg Balance Test Sit to Stand: Able to stand without using hands and stabilize independently Standing Unsupported: Able to stand safely 2 minutes Sitting with Back Unsupported but Feet Supported on Floor or Stool: Able to sit safely and securely 2 minutes Stand to Sit: Sits safely with minimal use of hands Transfers: Able to transfer safely, minor use of hands Standing Unsupported with Eyes Closed: Able to stand 10 seconds safely Standing Ubsupported with Feet Together: Able to place feet together independently and stand 1 minute safely From Standing, Reach Forward with Outstretched Arm: Can reach confidently >25 cm (10") From Standing Position, Pick up Object from Floor: Able to pick up shoe safely and  easily From Standing Position, Turn to Look Behind Over each Shoulder: Looks behind one side only/other side shows less weight shift Turn 360 Degrees: Able to turn 360 degrees safely in 4 seconds or less Standing Unsupported, Alternately Place Feet on Step/Stool: Able to stand independently and safely and complete 8 steps in 20 seconds Standing Unsupported, One Foot in Front: Able to place foot tandem independently and hold 30 seconds Standing on One Leg: Able to lift leg independently and hold equal to or more than 3 seconds Total Score: 53 Static Sitting Balance Static Sitting - Balance Support: No upper extremity supported;Feet supported Static Sitting - Level of Assistance: 7: Independent Dynamic Sitting Balance Dynamic Sitting - Balance Support: Feet supported;During functional activity Dynamic Sitting - Level of Assistance: 7: Independent Static Standing Balance Static Standing - Balance Support: During functional activity;No upper extremity supported Static Standing - Level of Assistance: 7: Independent Dynamic Standing Balance Dynamic Standing - Balance Support: During functional activity;No upper extremity supported Dynamic Standing - Level of Assistance: 7: Independent Functional Gait  Assessment Gait assessed : Yes Gait Level Surface: Walks 20 ft in less than 5.5 sec, no assistive devices, good speed, no evidence for imbalance, normal gait pattern, deviates no more than 6 in outside of the 12 in walkway width. Change in Gait Speed: Able to change speed, demonstrates mild gait deviations, deviates 6-10 in outside of the 12 in walkway width, or no gait deviations, unable to achieve a major change in velocity, or uses a change in velocity, or uses an assistive device. Gait with Horizontal Head Turns: Performs head turns smoothly with no change in gait. Deviates no more than 6 in outside 12 in walkway width Gait with Vertical Head Turns: Performs head turns with no change in gait.  Deviates no more than 6 in outside 12 in walkway width. Gait and Pivot Turn: Pivot turns safely in greater than 3 sec and stops with no loss of balance, or pivot turns safely within 3 sec and stops with mild imbalance, requires small steps to catch balance. Step Over Obstacle: Is able to step over one shoe box (4.5 in total height) without changing gait speed. No evidence of imbalance. Gait with Narrow Base of Support: Ambulates 7-9  steps. Gait with Eyes Closed: Walks 20 ft, no assistive devices, good speed, no evidence of imbalance, normal gait pattern, deviates no more than 6 in outside 12 in walkway width. Ambulates 20 ft in less than 7 sec. Ambulating Backwards: Walks 20 ft, uses assistive device, slower speed, mild gait deviations, deviates 6-10 in outside 12 in walkway width. Steps: Alternating feet, must use rail. Total Score: 24 Extremity Assessment  RUE Assessment RUE Assessment: Within Functional Limits LUE Assessment General Strength Comments: 4/5 left shoulder, elbow; 3+ wrist and hand RLE Assessment RLE Assessment: Within Functional Limits LLE Assessment LLE Assessment: Within Functional Limits    Lucile Shutters, SPT  08/14/2021, 4:28 PM

## 2021-08-15 ENCOUNTER — Other Ambulatory Visit (HOSPITAL_COMMUNITY): Payer: Self-pay

## 2021-08-15 LAB — GLUCOSE, CAPILLARY: Glucose-Capillary: 87 mg/dL (ref 70–99)

## 2021-08-15 MED ORDER — MAGNESIUM GLUCONATE 500 MG PO TABS
250.0000 mg | ORAL_TABLET | Freq: Every day | ORAL | 0 refills | Status: AC
  Filled 2021-08-15: qty 30, 60d supply, fill #0

## 2021-08-16 ENCOUNTER — Telehealth: Payer: Self-pay

## 2021-08-16 NOTE — Telephone Encounter (Signed)
LVM to return call.

## 2021-08-30 ENCOUNTER — Telehealth: Payer: Self-pay

## 2021-08-30 NOTE — Telephone Encounter (Signed)
Patient called stating she has had red, itching eyes all day and she does not know if it is coming from the baby aspirin, crestor or magnesium. She stated she has used clear eyes for redness and it is not helping. She wants to know if an eye drop can be called in for her to help. Please advise

## 2021-08-31 ENCOUNTER — Other Ambulatory Visit: Payer: Self-pay

## 2021-08-31 ENCOUNTER — Other Ambulatory Visit (HOSPITAL_COMMUNITY): Payer: Self-pay

## 2021-08-31 ENCOUNTER — Ambulatory Visit: Payer: 59 | Admitting: Occupational Therapy

## 2021-08-31 ENCOUNTER — Ambulatory Visit: Payer: 59 | Attending: Physical Medicine and Rehabilitation

## 2021-08-31 ENCOUNTER — Encounter: Payer: Self-pay | Admitting: Occupational Therapy

## 2021-08-31 ENCOUNTER — Other Ambulatory Visit: Payer: Self-pay | Admitting: Physical Medicine and Rehabilitation

## 2021-08-31 ENCOUNTER — Ambulatory Visit: Payer: 59

## 2021-08-31 VITALS — BP 134/69 | HR 69

## 2021-08-31 DIAGNOSIS — R2689 Other abnormalities of gait and mobility: Secondary | ICD-10-CM | POA: Diagnosis present

## 2021-08-31 DIAGNOSIS — M6281 Muscle weakness (generalized): Secondary | ICD-10-CM

## 2021-08-31 DIAGNOSIS — R262 Difficulty in walking, not elsewhere classified: Secondary | ICD-10-CM | POA: Diagnosis present

## 2021-08-31 DIAGNOSIS — I69354 Hemiplegia and hemiparesis following cerebral infarction affecting left non-dominant side: Secondary | ICD-10-CM | POA: Diagnosis present

## 2021-08-31 DIAGNOSIS — R41840 Attention and concentration deficit: Secondary | ICD-10-CM

## 2021-08-31 DIAGNOSIS — R278 Other lack of coordination: Secondary | ICD-10-CM

## 2021-08-31 DIAGNOSIS — R42 Dizziness and giddiness: Secondary | ICD-10-CM | POA: Diagnosis present

## 2021-08-31 DIAGNOSIS — R41842 Visuospatial deficit: Secondary | ICD-10-CM | POA: Insufficient documentation

## 2021-08-31 DIAGNOSIS — R2681 Unsteadiness on feet: Secondary | ICD-10-CM

## 2021-08-31 DIAGNOSIS — R41841 Cognitive communication deficit: Secondary | ICD-10-CM | POA: Diagnosis not present

## 2021-08-31 MED ORDER — EYE DROPS ADVANCED RELIEF 0.05-0.1-1-1 % OP SOLN: 1.0000 [drp] | Freq: Two times a day (BID) | OPHTHALMIC | 0 refills | Status: DC

## 2021-08-31 MED ORDER — EYE DROPS ADVANCED RELIEF 0.05-0.1-1-1 % OP SOLN
1.0000 [drp] | Freq: Two times a day (BID) | OPHTHALMIC | 0 refills | Status: DC
  Filled 2021-08-31: qty 8, fill #0

## 2021-08-31 NOTE — Therapy (Signed)
[facility] [address] [place], [place], [address] Phone: [phone]   Fax:  [phone]  Physical Therapy Evaluation  Patient Details  Name: [name] MRN: [id number] Date of Birth: 1961/06/18 Referring Provider (PT): Ranell Patrick Clide Deutscher, MD   Encounter Date: 08/31/2021   PT End of Session - 08/31/21 0800     Visit Number 1    Number of Visits 17    Date for PT Re-Evaluation 11/23/21   pushed out due to scheduling   Authorization Type [employer] (VL: 60 per discipline)    PT Start Time 0800    PT Stop Time 0845    PT Time Calculation (min) 45 min    Activity Tolerance Patient tolerated treatment well    Behavior During Therapy [facility] for tasks assessed/performed             Past Medical History:  Diagnosis Date   Blood in stool    Hyperlipidemia    Vaginal fibroids     No past surgical history on file.  Vitals:   08/31/21 0813  BP: 134/69  Pulse: 69      Subjective Assessment - 08/31/21 0806     Subjective Pt presented to ED on 07/28/21 with dizziness, unsteady gait and vomitting. Recently discharged from the hospital. Ambulating into session with RW, but reports ambulating without use of AD in home. Pt reports redness in eyes that was present when she left the house, reports it has worsened since being home. No falls to report, did trip once over the table. Reports the balance has felt off, and reports some residual weakness on the L side.    Patient is accompained by: Family member   Boyfriend   Pertinent History HLD, Prediabetes    Limitations Standing;Walking;House hold activities    Diagnostic tests CT/MRI showed acute infarct right cerebellum and left occipital cortex.  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  55% narrowing at the left vertebral origin.    Patient Stated Goals Improve the  Balance    Currently in Pain? Yes    Pain Score 4     Pain Location Neck    Pain Orientation Right;Left    Pain Descriptors / Indicators Sore    Pain Type Acute pain    Pain Onset [place]                [facility] PT Assessment - 08/31/21 0001       Assessment   Medical Diagnosis L CVA    Referring Provider (PT) Ranell Patrick Clide Deutscher, MD    Onset Date/Surgical Date 07/28/21    Hand Dominance Right    Prior Therapy Acute Care + CIR      Precautions   Precautions Fall      Restrictions   Weight Bearing Restrictions No      Balance Screen   Has the patient fallen in the past 6 months Yes    How many times? 1   fractured L wrist   Has the patient had a decrease in activity level because of a fear of falling?  Yes    Is the patient reluctant to leave their home because of a fear of falling?  Yes      [place] Private residence    Living Arrangements Spouse/significant other    Available Help at Discharge  Family    Type of [place] to enter    Entrance Stairs-Number of Steps [employer] One level    [place] - 2 wheels;Tub bench      Prior Function   Level of Independence Independent    Vocation Full time employment    Vocation Requirements Lead at [place]      Observation/Other Assessments   Focus on Therapeutic Outcomes (FOTO)  Stroke LE: 49%      Sensation   Light Touch Appears Intact    Additional Comments for BLE's      Coordination   Gross Motor Movements are Fluid and Coordinated Yes   for BLE's   Heel Shin Test WNL      ROM / Strength   AROM / PROM / Strength Strength      Strength   Overall Strength Comments strength screen completed from seated position    Strength Assessment Site Hip;Knee;Ankle    Right/Left Hip Right;Left    Right Hip Flexion 3+/5    Left Hip Flexion 3+/5    Right/Left Knee Right;Left    Right Knee Flexion  4/5    Right Knee Extension 4/5    Left Knee Flexion 4-/5    Left Knee Extension 4-/5    Right/Left Ankle Right;Left    Right Ankle Dorsiflexion 4/5    Left Ankle Dorsiflexion 3+/5      Bed Mobility   Bed Mobility --   pt declines difficulty with bed mobility upon eval     Transfers   Transfers Sit to Stand;Stand to Sit    Sit to Stand 5: Supervision    Five time sit to stand comments  14.22 secs with UE support from standard height chair    Stand to Sit 5: Supervision      Ambulation/Gait   Ambulation/Gait Yes    Ambulation/Gait Assistance 5: Supervision;4: Min guard    Ambulation/Gait Assistance Details Completed ambulation with RW x 45 ft. Then completed additional 120 ft without AD, PT providing CGA due to unsteadiness/imbalance. Seated rest break required due to fatigue. PT provided tennis balls for RW for eased propulsion    Ambulation Distance (Feet) 45 Feet   x 1, 120 x 1   Assistive device Rolling walker;None    Gait Pattern Step-through pattern;Decreased step length - right;Decreased step length - left;Decreased hip/knee flexion - left;Decreased stance time - left;Decreased dorsiflexion - left    Ambulation Surface Level;Indoor    Gait velocity 17.25 secs with RW = 1.90 ft/sec (with RW)    Stairs Yes    Stairs Assistance 5: Supervision;4: Min guard    Stairs Assistance Details (indicate cue type and reason) Completed stairs with step to pattern with bil rails x 4, supervision. Trialed reciprocal pattern but pt demo difficulty requiring CGA.    Stair Management Technique Two rails;Step to pattern;Alternating pattern;Forwards    Number of Stairs 8    Height of Stairs 6    Gait Comments Vitals after ambulation: BP: 123/698; HR: 66      Standardized Balance Assessment   Standardized Balance Assessment Timed Up and Go Test;Berg Balance Test      Berg Balance Test   Sit to Stand Able to stand  independently using hands    Standing Unsupported Able to stand 2 minutes with  supervision    Sitting with Back Unsupported but Feet Supported on Floor  or Stool Able to sit safely and securely 2 minutes    Stand to Sit Controls descent by using hands    Transfers Able to transfer with verbal cueing and /or supervision    Standing Unsupported with Eyes Closed Able to stand 10 seconds with supervision    Standing Unsupported with Feet Together Able to place feet together independently and stand for 1 minute with supervision    From Standing Position, Turn to Look Behind Over each Shoulder Turn sideways only but maintains balance    Turn 360 Degrees Needs close supervision or verbal cueing    Berg comment: will finish assesment at next visit      Timed Up and Go Test   TUG Normal TUG    Normal TUG (seconds) 22.72   with RW; 19.25 secs w/o AD. CGA                       Objective measurements completed on examination: See above findings.                PT Education - 08/31/21 0852     Education Details Educated on POC/Eval Findings    Person(s) Educated Patient;Spouse    Methods Explanation    Comprehension Verbalized understanding              PT Short Term Goals - 08/31/21 0913       PT SHORT TERM GOAL #1   Title Pt will be indepdent with initial HEP for balance/strength    Baseline no HEP established    Time 4    Period Weeks    Status New    Target Date 09/28/21      PT SHORT TERM GOAL #2   Title Pt will improve TUG to </= 16 seconds with LRAD to demonstrate reduced fall risk    Baseline 22.72 secs with RW    Time 4    Period Weeks    Status New      PT SHORT TERM GOAL #3   Title Patient will improve gait speed to >/= 2.0 ft/sec w/ LRAD to demo improved community mobility    Baseline 1.90 ft/sec    Time 4    Period Weeks    Status New      PT SHORT TERM GOAL #4   Title STG to be set for [employer] upon assesment    Baseline TBA    Time 4    Period Weeks    Status New      PT SHORT TERM GOAL #5   Title  Pt will be able to ambulate >/= 300 ft w/ LRAD and supervision to demo improved household/community mobility    Baseline 120' CGA    Time 4    Period Weeks    Status New               PT Long Term Goals - 08/31/21 0916       PT LONG TERM GOAL #1   Title Pt will be independent with final HEP for balance/strength    Baseline no HEP established    Time 8    Period Weeks    Status New    Target Date 10/26/21      PT LONG TERM GOAL #2   Title Berg Balance to be set upon assesment    Baseline TBA    Time 8    Period Weeks    Status New  PT LONG TERM GOAL #3   Title Pt will improve TUG to </= 12 seconds w/ LRAD to demo improved balance    Baseline 22.72 secs w/ RW    Time 8    Period Weeks    Status New      PT LONG TERM GOAL #4   Title Pt will improve 5x sit <> stand to </= 12 seconds to demo improved balance    Baseline 14.22 secs    Time 8    Period Weeks    Status New      PT LONG TERM GOAL #5   Title Pt will improve gait speed to >/= 2.6 ft/sec to demo improved community ambulator    Baseline 1.90 ft/sec    Time 8    Period Weeks    Status New      Additional Long Term Goals   Additional Long Term Goals Yes      PT LONG TERM GOAL #6   Title Pt will be able to ascend/descend 8 stairs with reciprocal pattern and supervision with use of single rail vs. no rail    Baseline step to pattern bil rails    Time 8    Period Weeks    Status New      PT LONG TERM GOAL #7   Title Pt will improve FOTO to >/= 62%    Baseline 49%    Time 8    Period Weeks    Status New                    Plan - 08/31/21 0906     Clinical Impression Statement Patient is a 61 y.o. female referred to Neuro OPPT services for CVA. Patient's PMH significant for the following: HLD, Prediabetes. Upon evaluation, patient presents with the following impairments: decreased strength, abnormal gait, impaired balance, decreased activity tolerance/endurance, dizziness, and  increased fall risk. Patient is currently ambulating at 1.90 ft/sec with use of RW. Patient is high fall risk with TUG time of 22.72 secs with RW, and 5x sit <> stand of 14.22 secs with UE support. Started Berg Assesment, unable to finish due to time constraints will finish at next visit. patient will benefit from skilled PT services to address impairments, maximize functional mobility, and return to PLOF.    Personal Factors and Comorbidities Comorbidity 2;Transportation    Comorbidities HLD, Prediabetes    Examination-Activity Limitations Transfers;Stairs;Stand;Locomotion Level;Squat;Bend    Examination-Participation Restrictions Occupation;Cleaning;Community Activity;Driving    Stability/Clinical Decision Making Stable/Uncomplicated    Clinical Decision Making Low    Rehab Potential Good    PT Frequency 2x / week    PT Duration 8 weeks   plus eval   PT Treatment/Interventions ADLs/Self Care Home Management;Aquatic Therapy;Canalith Repostioning;Cryotherapy;Electrical Stimulation;Moist Heat;DME Instruction;Gait training;Stair training;Functional mobility training;Therapeutic activities;Therapeutic exercise;Balance training;Neuromuscular re-education;Patient/family education;Orthotic Fit/Training;Manual techniques;Passive range of motion;Vestibular;Energy conservation    PT Next Visit Plan Finish Berg Balance + Update LTGs. Initiate HEP focused on supine/standing strengthening and balance.    Recommended Other Services Speech and Occupational Therapy    Consulted and Agree with Plan of Care Patient;Family member/caregiver    Family Member Consulted Boyfriend             Patient will benefit from skilled therapeutic intervention in order to improve the following deficits and impairments:  Abnormal gait, Decreased balance, Decreased endurance, Difficulty walking, Dizziness, Decreased activity tolerance, Decreased knowledge of use of DME, Decreased strength, Pain, Decreased cognition  Visit  Diagnosis: Muscle  weakness (generalized)  Unsteadiness on feet  Other abnormalities of gait and mobility  Difficulty in walking, not elsewhere classified  Dizziness and giddiness     Problem List Patient Active Problem List   Diagnosis Date Noted   Acute ischemic left PCA stroke ([place]) 08/03/2021   Cerebellar infarction ([place]) 07/30/2021   Cerebral edema ([place]) 07/30/2021   Hyperlipidemia 07/30/2021   Prediabetes 07/30/2021    Jones Bales, PT, DPT 08/31/2021, 10:33 AM  [facility] [address] [place], [place], [address] Phone: [phone]   Fax:  [phone]  Name: [name] MRN: [id number] Date of Birth: 07-Nov-1960

## 2021-08-31 NOTE — Patient Instructions (Signed)

## 2021-08-31 NOTE — Telephone Encounter (Signed)
Patient notified. Patient wanted to send the medication to [facility]

## 2021-08-31 NOTE — Addendum Note (Signed)
Addended by: Casilda Carls on: 08/31/2021 03:16 PM   Modules accepted: Orders

## 2021-08-31 NOTE — Therapy (Signed)
[facility] [address] [place], [place], [address] Phone: [phone]   Fax:  [phone]  Occupational Therapy Evaluation  Patient Details  Name: [name] MRN: [id number] Date of Birth: March 27, 1961 Referring Provider (OT): Jinger Neighbors, MD   Encounter Date: 08/31/2021   OT End of Session - 08/31/21 0956     Visit Number 1    Number of Visits 17    Date for OT Re-Evaluation 11/09/21    Authorization Type [employer]    Authorization Time Period VL: 60 combined (hard max), No Auth Req'd    Authorization - Number of Visits 20    Activity Tolerance Patient tolerated treatment well    Behavior During Therapy [facility] for tasks assessed/performed             Past Medical History:  Diagnosis Date   Blood in stool    Hyperlipidemia    Vaginal fibroids     History reviewed. No pertinent surgical history.  There were no vitals filed for this visit.   Subjective Assessment - 08/31/21 0948     Subjective  Pt is a 61 year old female that presents to Neuro OPOT with left sided weakness, unsteadiness on feet and cognitive and visual deficits s/p CVA on 07/28/2021. Pt reports goal to "get bak to where I was" and reports "my balance is all jacked up and i'm thinking slow". Pt lives with her mom in an apartment with no difficulty accessing bathroom. Pt is accompanied by boyfriend today. Pt reports some pain in BUE shoulders but reports it could be d/t PT evaluation.    Patient is accompanied by: Family member   boyfriend   Pertinent History hyperlipidemia,  prediabetes and tobacco use.    Limitations Fall Risk    Currently in Pain? Yes    Pain Score 4     Pain Location Neck    Pain Orientation Left;Right    Pain Descriptors / Indicators Sore    Pain Type Acute pain    Pain Onset Efrain Sella OT Assessment - 08/31/21 0848       Assessment   Medical Diagnosis L CVA    Referring Provider (OT)  Jinger Neighbors, MD    Onset Date/Surgical Date 07/28/21    Hand Dominance Right    Prior Therapy inpatient rehab      Precautions   Precautions Fall      Home  Environment   Family/patient expects to be discharged to: Private residence    Living Arrangements Parent    Available Help at Discharge Family    Type of [place] unit    [place] - 2 wheels;Tub bench    Lives With Family      Prior Function   Level of Independence Independent    Vocation Full time employment    Vocation Requirements lead for pharmaceutical distribution    Leisure sleep and reading      ADL   Eating/Feeding Needs assist with cutting food    Grooming Modified independent    Upper Body Bathing Supervision/safety    Lower Body Bathing Supervision/safety    Upper Body Dressing Independent    Lower Body Dressing Modified independent    Toilet Transfer Modified independent    Toileting - Clothing Manipulation Modified independent    [place]  Tub/Shower Transfer Supervision/safety      IADL   Prior Level of Function Shopping independent    Shopping Needs to be accompanied on any shopping trip    Prior Level of Function Light Housekeeping independent    Light Housekeeping Needs help with all home maintenance tasks;Does not participate in any housekeeping tasks    Prior Level of Function Meal Prep independent    Meal Prep Able to complete simple cold meal and snack prep    Prior Level of Function Community Mobility driving prior    [employer] Relies on family or friends for transportation    Prior Level of Function Medication Managment independent prior    Medication Management Is responsible for taking medication in correct dosages at correct time    Prior Level of Function Financial Management independent    Financial Management --   reports doing it on her phone independently     Written Expression    Dominant Hand Right      Vision - History   Baseline Vision Wears glasses all the time   reading and driving per patient     Vision Assessment   Comment pt complaining of blurriness and severe dry and itchy eyes (bilaterally). On observation patient has very red eyes bilaterally      Cognition   Overall Cognitive Status Impaired/Different from baseline    Area of Impairment Attention;Memory;Awareness    Attention Comments Trail Making A 1:39.59s with 100% accuracy, Trail Making B with 100% accuracy in 5:15.66s      Observation/Other Assessments   Focus on Therapeutic Outcomes (FOTO)  62% - anticipated score at discharge 71%      Sensation   Light Touch Appears Intact    Hot/Cold Impaired Detail    Hot/Cold Impaired Details Impaired LUE      Coordination   9 Hole Peg Test Right;Left   pt with significantly long nails that may have slowed score.   Right 9 Hole Peg Test 54.37s    Left 9 Hole Peg Test 45.37s    Box and Blocks did not assess      ROM / Strength   AROM / PROM / Strength AROM;Strength      AROM   Overall AROM  Within functional limits for tasks performed    AROM Assessment Site Shoulder    Right/Left Shoulder Left    Left Shoulder Flexion 125 Degrees   R 135     Strength   Overall Strength Within functional limits for tasks performed    Overall Strength Comments proximal WFL      Hand Function   Right Hand Gross Grasp Functional    Right Hand Grip (lbs) 35    Left Hand Gross Grasp Functional    Left Hand Grip (lbs) 19.6                                OT Short Term Goals - 08/31/21 1043       OT SHORT TERM GOAL #1   Title Pt will be independent with initial HEP    Time 4    Period Weeks    Status New    Target Date 09/28/21      OT SHORT TERM GOAL #2   Title Pt will verbalize understanding of memory compensatory strategies    Time 4    Period Weeks    Status New  OT SHORT TERM GOAL #3   Title Pt will perform  environmental scanning in moderately distracting environment with 90% accuracy or greater.    Time 4    Period Weeks    Status New      OT SHORT TERM GOAL #4   Title Pt will increase grip strength by 5 lbs or greater in LUE.    Baseline L 19.6, R 35    Time 4    Period Weeks    Status New      OT SHORT TERM GOAL #5   Title Pt will improve 9 hole peg test by 3 seconds or more in BUE for increase in fine motor coordination and processing speed    Baseline R 54.37s, L 45.37s    Time 4    Period Weeks    Status New               OT Long Term Goals - 08/31/21 1049       OT LONG TERM GOAL #1   Title Pt will be independent with updated HEP    Time 10    Period Weeks    Status New    Target Date 11/09/21      OT LONG TERM GOAL #2   Title Pt will increase 9 hole peg test score with BUE by 8 seconds or greater in order to increase fine motor coordination.    Baseline R 54.37s, L 45.37s    Time 10    Period Weeks    Status New      OT LONG TERM GOAL #3   Title Pt will be increase grip strength in LUE by 10 lbs or greater    Baseline L 19.6, R 35    Time 10    Period Weeks    Status New      OT LONG TERM GOAL #4   Title Pt will complete alternating attention task with 90% accuracy or greater and with appropriate time.    Baseline Trail Making B > 5 minutes with 100% accuracy    Time 10    Period Weeks    Status New      OT LONG TERM GOAL #5   Title Pt will increase shoulder range of motion in LUE to 130 degrees or greater for increasing ability for overhead reach.    Baseline 125 degreees    Time 10    Period Weeks    Status New      Long Term Additional Goals   Additional Long Term Goals Yes      OT LONG TERM GOAL #6   Title Complete FOTO at discharge with score of 70% or greater.    Baseline 62%    Time 10    Period Weeks    Status New                   Plan - 08/31/21 0959     Clinical Impression Statement Pt is a 61 year old female that  presents to Neuro OPOT with left sided weakness, unsteadiness on feet and cognitive and visual deficits s/p CVA on 07/28/2021. Pt presents today with evidence of decreased strength, coordination, range of motion in LUE and with deficits with attention and memory. Pt presents with severe red eyes in bilateral eyes and complaints of blurry vision. Skilled occupational therapy is recommended to target listed areas of deficit and increase independence with ADLs and IADLs and decreased caregiver burden.  OT Occupational Profile and History Problem Focused Assessment - Including review of records relating to presenting problem    Occupational performance deficits (Please refer to evaluation for details): ADL's;IADL's;Leisure;Work    Marketing executive / Function / Physical Skills ADL;Decreased knowledge of precautions;Coordination;Decreased knowledge of use of DME;Strength;GMC;IADL;ROM;FMC;UE functional use;Dexterity;Flexibility;Vision    Cognitive Skills Attention;Problem Solve    Rehab Potential Good    Clinical Decision Making Limited treatment options, no task modification necessary    Comorbidities Affecting Occupational Performance: None    Modification or Assistance to Complete Evaluation  No modification of tasks or assist necessary to complete eval    OT Frequency 2x / week    OT Duration Other (comment)   16 visits over 10 weeks   OT Treatment/Interventions Aquatic Therapy;Self-care/ADL training;Moist Heat;Fluidtherapy;DME and/or AE instruction;Therapeutic activities;Therapeutic exercise;Cognitive remediation/compensation;Visual/perceptual remediation/compensation;Passive range of motion;Neuromuscular education;Functional Mobility Training;Energy conservation;Patient/family education    Plan Theraputty LUE, coordination HEP, Memory Compensation Strategies    Consulted and Agree with Plan of Care Patient             Patient will benefit from skilled therapeutic intervention in order to improve  the following deficits and impairments:   Body Structure / Function / Physical Skills: ADL, Decreased knowledge of precautions, Coordination, Decreased knowledge of use of DME, Strength, GMC, IADL, ROM, FMC, UE functional use, Dexterity, Flexibility, Vision Cognitive Skills: Attention, Problem Solve     Visit Diagnosis: Hemiplegia and hemiparesis following cerebral infarction affecting left non-dominant side ([place]) - Plan: Ot plan of care cert/re-cert  Muscle weakness (generalized) - Plan: Ot plan of care cert/re-cert  Other lack of coordination - Plan: Ot plan of care cert/re-cert  Visuospatial deficit - Plan: Ot plan of care cert/re-cert  Attention and concentration deficit - Plan: Ot plan of care cert/re-cert  Unsteadiness on feet - Plan: Ot plan of care cert/re-cert    Problem List Patient Active Problem List   Diagnosis Date Noted   Acute ischemic left PCA stroke ([place]) 08/03/2021   Cerebellar infarction ([place]) 07/30/2021   Cerebral edema ([place]) 07/30/2021   Hyperlipidemia 07/30/2021   Prediabetes 07/30/2021    Zachery Conch, OT 08/31/2021, 11:26 AM  [facility] [address] [place], [place], [address] Phone: [phone]   Fax:  [phone]  Name: [name] MRN: [id number] Date of Birth: October 11, 1960

## 2021-08-31 NOTE — Therapy (Signed)
[name] [address] [place], [place], [address] Phone: [phone]   Fax:  [phone]  Speech Language Pathology Evaluation  Patient Details  Name: [name] MRN: [id number] Date of Birth: 1961-05-20 Referring Provider (SLP): Kingsley Callander., MD   Encounter Date: 08/31/2021   End of Session - 08/31/21 1059     Visit Number 1    Number of Visits 25    Date for SLP Re-Evaluation 11/29/21    SLP Start Time 0933    SLP Stop Time  1019    SLP Time Calculation (min) 46 min    Activity Tolerance Patient tolerated treatment well             Past Medical History:  Diagnosis Date   Blood in stool    Hyperlipidemia    Vaginal fibroids     History reviewed. No pertinent surgical history.  There were no vitals filed for this visit.   Subjective Assessment - 08/31/21 0940     Subjective "I left a rag and the water running in the bathroom and came back wiht the water running over the sink."    Currently in Pain? Yes    Pain Score 4     Pain Location Neck    Pain Orientation Right;Left    Pain Descriptors / Indicators Sore    Pain Type Acute pain                SLP Evaluation [facility] - 08/31/21 0940       SLP Visit Information   SLP Received On 08/31/21    Referring Provider (SLP) Kingsley Callander., MD    Onset Date 07-28-21    Medical Diagnosis CVA      Subjective   Patient/Family Stated Goal "Being able to think fast and not meditate on what I have to do."      General Information   HPI Pt    Mobility Status ambulated with rolling walker into ST room      Prior Functional Status   Cognitive/Linguistic Baseline Within functional limits    Type of Home Apartment     Lives With Family    Available Support Family    Vocation Full time employment      Cognition   Overall Cognitive Status Impaired/Different from baseline    Area of Impairment Attention;Memory;Awareness   organization/planning    Attention Comments Pt gave example of alternating memort deficit (see "s" statement). During evalation she had difficulty with story recall, suggesting a working memory/attention deficit. Pt endorsed difficulty processing what is being said to her, as well as she paused for 6-7 seconds while writing "12" on clock drawing task suggesting a need to process the task prior to starting.    Memory Comments Pt gave example of memory deficit (retrieval) with not being able to recall shopping list her mother provided her, whereas premorbidly she stated she would have recalled a list. Currently, she uses internal and external cues for recalling medication times (at breakfast, and when she begins to yawn).    Awareness Comments Pt demonstrated emergent awareness with clock drawing, indicating she was aware of errors once they were made. however she did not modify her errors (?problem solving vs processing).    Patent examiner Impaired   difficulty planing/organizing clock drawing task with spacing numbers     Auditory Comprehension   Overall Auditory Comprehension Appears within functional limits for tasks assessed  Verbal Expression   Overall Verbal Expression Appears within functional limits for tasks assessed      Motor Speech   Overall Motor Speech Appears within functional limits for tasks assessed      Standardized Assessments   Standardized Assessments  Cognitive Linguistic Quick Test   initiated - will be completed next session                            SLP Education - 08/31/21 1051     Education Details overall memory strategies, memory strategy of using alarms for med administration, attention strategy of making list of to-dos and keeping it on frig in order to keep pt more on-task during the day    Person(s) Educated Patient    Methods Explanation;Handout    Comprehension Verbalized understanding;Verbal cues required;Need further  instruction              SLP Short Term Goals - 08/31/21 1128       SLP SHORT TERM GOAL #1   Title Pt will use comensatory strategies for memory successfully between 3 sessions    Time 4    Period Weeks    Status New    Target Date 09/28/21      SLP SHORT TERM GOAL #2   Title Pt will demonstrate adeuqate organizational skills to complete mod complex therapy tasks with modified independence (compensations) in 2 sessions    Time 4    Period Weeks    Status New    Target Date 09/28/21      SLP SHORT TERM GOAL #3   Title pt will demonstrate emergent awareness during a therapy task in order to make corrections in 3 sessions    Time 4    Period Weeks    Status New    Target Date 09/28/21      SLP SHORT TERM GOAL #4   Title pt will complete cognitive testing in first 1-2 sessions    Time 2    Period --   sessions   Status New    Target Date 09/14/21              SLP Long Term Goals - 08/31/21 1120       SLP LONG TERM GOAL #1   Title Pt will demonstrate adequate organizational skills to complete mod complex/complex therapy tasks with modified independence (compensations) in 4 sessions    Time 8    Period Weeks    Status New    Target Date 10/26/21      SLP LONG TERM GOAL #2   Title pt will demo anticipatory awareness by pre-planning/organizing a task prior to initiation, independently, in 3 sessions    Time 8    Period Weeks    Status New    Target Date 10/26/21      SLP LONG TERM GOAL #3   Title Pt will demonstrate adequate organizational skills to complete mod complex/complex therapy tasks with modified independence (compensations) in 4 sessions    Time 12    Period Weeks    Status New    Target Date 11/29/21      SLP LONG TERM GOAL #4   Title pt will independently use memory strategies for work-like tasks in 5 sessions    Time 12    Period Weeks    Status New    Target Date 11/29/21  Plan - 08/31/21 1102     Clinical  Impression Statement Mylea Roarty Mariann Laster") presents today with cognitive communication deficits in the areas of attention, awareness, executive function (planning and organization), and memory. Azelea gave examples today of memory and attention deficits occuring since her discharge in her home. Pt lives with her mother. Pt will complete standardized cognitive testing next session and more goals may be added at that time. SLP believes pt will benefit from skilled ST targeting pt's deficit areas in order to return to workforce and participate in [place] for her mother.    Speech Therapy Frequency 2x / week    Duration 12 weeks    Treatment/Interventions Compensatory techniques;Environmental controls;SLP instruction and feedback;Cueing hierarchy;Cognitive reorganization;Compensatory strategies;Patient/family education;Internal/external aids    Potential to Achieve Goals Good    Consulted and Agree with Plan of Care Patient             Patient will benefit from skilled therapeutic intervention in order to improve the following deficits and impairments:   Cognitive communication deficit - Plan: SLP plan of care cert/re-cert    Problem List Patient Active Problem List   Diagnosis Date Noted   Acute ischemic left PCA stroke ([place]) 08/03/2021   Cerebellar infarction ([place]) 07/30/2021   Cerebral edema ([place]) 07/30/2021   Hyperlipidemia 07/30/2021   Prediabetes 07/30/2021    Garald Balding, [place] 08/31/2021, 11:29 AM  [facility] [address] [place], [place], [address] Phone: [phone]   Fax:  [phone]  Name: DELILIAH SPRANGER MRN: [id number] Date of Birth: 07/23/1960

## 2021-09-03 ENCOUNTER — Ambulatory Visit: Payer: 59 | Admitting: Physical Therapy

## 2021-09-03 ENCOUNTER — Encounter: Payer: Self-pay | Admitting: Physical Therapy

## 2021-09-03 ENCOUNTER — Other Ambulatory Visit: Payer: Self-pay

## 2021-09-03 VITALS — BP 131/65

## 2021-09-03 DIAGNOSIS — R2681 Unsteadiness on feet: Secondary | ICD-10-CM

## 2021-09-03 DIAGNOSIS — M6281 Muscle weakness (generalized): Secondary | ICD-10-CM

## 2021-09-03 DIAGNOSIS — R262 Difficulty in walking, not elsewhere classified: Secondary | ICD-10-CM

## 2021-09-03 DIAGNOSIS — R2689 Other abnormalities of gait and mobility: Secondary | ICD-10-CM

## 2021-09-03 DIAGNOSIS — R41841 Cognitive communication deficit: Secondary | ICD-10-CM | POA: Diagnosis not present

## 2021-09-03 NOTE — Patient Instructions (Signed)
Access Code: [email or web address] URL: [email or web address] Date: 09/03/2021 Prepared by: Mickie Bail Azion Centrella  Exercises Heel to toe walking - 1 x daily - 7 x weekly - 3 sets - 3 reps Backward heel to toe walking - 1 x daily - 7 x weekly - 3 sets - 3 reps Mini Squats with Walker and Chair - 1 x daily - 7 x weekly - 2 sets - 10 reps

## 2021-09-03 NOTE — Therapy (Signed)
[facility] [address] [place], [place], [address] Phone: [phone]   Fax:  [phone]  Physical Therapy Treatment  Patient Details  Name: [name] MRN: [id number] Date of Birth: 10-14-1960 Referring Provider (PT): Ranell Patrick Clide Deutscher, MD   Encounter Date: 09/03/2021   PT End of Session - 09/03/21 0857     Visit Number 2    Number of Visits 17    Date for PT Re-Evaluation 11/23/21   pushed out due to scheduling   Authorization Type [employer] (VL: 60 per discipline)    PT Start Time 0855    PT Stop Time 0934    PT Time Calculation (min) 39 min    Equipment Utilized During Treatment Gait belt    Activity Tolerance Patient tolerated treatment well    Behavior During Therapy WFL for tasks assessed/performed             Past Medical History:  Diagnosis Date   Blood in stool    Hyperlipidemia    Vaginal fibroids     History reviewed. No pertinent surgical history.  Vitals:   09/03/21 0901  BP: 131/65     Subjective Assessment - 09/03/21 0858     Subjective Patient reports she is doing well, eyes are no longer bothering her. No new falls or changes    Patient is accompained by: --     Pertinent History HLD, Prediabetes    Limitations Standing;Walking;House hold activities    Diagnostic tests CT/MRI showed acute infarct right cerebellum and left occipital cortex.  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  55% narrowing at the left vertebral origin.    Patient Stated Goals Improve the Balance    Currently in Pain? No/denies    Pain Onset Efrain Sella PT Assessment - 09/03/21 0903       Berg Balance Test   Sit to Stand Able to stand  independently using hands    Standing Unsupported Able to stand 2 minutes with supervision    Sitting with Back Unsupported but Feet  Supported on Floor or Stool Able to sit safely and securely 2 minutes    Stand to Sit Controls descent by using hands    Transfers Able to transfer with verbal cueing and /or supervision    Standing Unsupported with Eyes Closed Able to stand 10 seconds with supervision    Standing Unsupported with Feet Together Able to place feet together independently and stand for 1 minute with supervision    From Standing, Reach Forward with Outstretched Arm Can reach forward >12 cm safely (5")    From Standing Position, Pick up Object from Floor Able to pick up shoe, needs supervision    From Standing Position, Turn to Look Behind Over each Shoulder Turn sideways only but maintains balance    Turn 360 Degrees Needs close supervision or verbal cueing    Standing Unsupported, Alternately Place Feet on Step/Stool Able to complete >2 steps/needs minimal assist    Standing Unsupported, One Foot in Front Able to plae foot ahead of the other independently and hold 30 seconds    Standing on One Leg Tries to lift leg/unable to hold 3 seconds but remains standing independently   RLE   Total Score 35    Berg comment: High  fall risk                          [facility] Adult PT Treatment/Exercise - 09/03/21 1053       Balance   Balance Assessed Yes      High Level Balance   High Level Balance Activities Tandem walking    High Level Balance Comments Fwd and retro tandem walking at counter, x4 each direction with unilateral UE support on counter. Max verbal cues to maintain upright trunk and midline orientation. CGA throughout      Exercises   Exercises Knee/Hip    Other Exercises  Mini squats to chair w/BUE support on RW x3 reps. Noted significant trunk flexion, verbal cues provided for upright posture. Progressed to unilateral UE support on RW which caused pt to rotate towards side w/support x4 reps. Lastly progressed to UE prn and pt able to perform 10 reps w/good technique                        PT Short Term Goals - 09/03/21 1148       PT SHORT TERM GOAL #1   Title Pt will be indepdent with initial HEP for balance/strength    Baseline no HEP established    Time 4    Period Weeks    Status New    Target Date 09/28/21      PT SHORT TERM GOAL #2   Title Pt will improve TUG to </= 16 seconds with LRAD to demonstrate reduced fall risk    Baseline 22.72 secs with RW    Time 4    Period Weeks    Status New      PT SHORT TERM GOAL #3   Title Patient will improve gait speed to >/= 2.0 ft/sec w/ LRAD to demo improved community mobility    Baseline 1.90 ft/sec    Time 4    Period Weeks    Status New      PT SHORT TERM GOAL #4   Title Pt will improve Berg score to >42/56 for reduced risk of falls    Baseline 35/56    Time 4    Period Weeks    Status Revised      PT SHORT TERM GOAL #5   Title Pt will be able to ambulate >/= 300 ft w/ LRAD and supervision to demo improved household/community mobility    Baseline 120' CGA    Time 4    Period Weeks    Status New               PT Long Term Goals - 09/03/21 1149       PT LONG TERM GOAL #1   Title Pt will be independent with final HEP for balance/strength    Baseline no HEP established    Time 8    Period Weeks    Status New    Target Date 10/26/21      PT LONG TERM GOAL #2   Title Pt will perform >48/56 on Berg for improved dynamic balance and reduced fall risk    Baseline 35/56    Time 8    Period Weeks    Status Revised      PT LONG TERM GOAL #3   Title Pt will improve TUG to </= 12 seconds w/ LRAD to demo improved balance    Baseline 22.72 secs w/ RW  Time 8    Period Weeks    Status New      PT LONG TERM GOAL #4   Title Pt will improve 5x sit <> stand to </= 12 seconds to demo improved balance    Baseline 14.22 secs    Time 8    Period Weeks    Status New      PT LONG TERM GOAL #5   Title Pt will improve gait speed to >/= 2.6 ft/sec to demo improved community  ambulator    Baseline 1.90 ft/sec    Time 8    Period Weeks    Status New      PT LONG TERM GOAL #6   Title Pt will be able to ascend/descend 8 stairs with reciprocal pattern and supervision with use of single rail vs. no rail    Baseline step to pattern bil rails    Time 8    Period Weeks    Status New      PT LONG TERM GOAL #7   Title Pt will improve FOTO to >/= 62%    Baseline 49%    Time 8    Period Weeks    Status New                   Plan - 09/03/21 0931     Clinical Impression Statement Patient presents with RLE weakness, impaired spatial awareness and poor BLE coordination. Empahsis of session on completing Berg assessment and establishing HEP. Patient obtained a 35/56 on Berg, placing her at a high fall risk. Patient requires max verbal and tactile cues for midline orientation with gait and ADLs due to tendency to lean against furniture at home to prevent falling. Patient will continue to benefit from skilled PT for reduced fall risk, improved balance and return to PLOF.    Personal Factors and Comorbidities Comorbidity 2;Transportation    Comorbidities HLD, Prediabetes    Examination-Activity Limitations Transfers;Stairs;Stand;Locomotion Level;Squat;Bend    Examination-Participation Restrictions Occupation;Cleaning;Community Activity;Driving    Stability/Clinical Decision Making Stable/Uncomplicated    Rehab Potential Good    PT Frequency 2x / week    PT Duration 8 weeks   plus eval   PT Treatment/Interventions ADLs/Self Care Home Management;Aquatic Therapy;Canalith Repostioning;Cryotherapy;Electrical Stimulation;Moist Heat;DME Instruction;Gait training;Stair training;Functional mobility training;Therapeutic activities;Therapeutic exercise;Balance training;Neuromuscular re-education;Patient/family education;Orthotic Fit/Training;Manual techniques;Passive range of motion;Vestibular;Energy conservation    PT Next Visit Plan Update/ review HEP, midline orientation  using biofeedback, single leg stance, dual tasks    PT Home Exercise Plan [id number]    Consulted and Agree with Plan of Care Patient    Family Member Consulted --             Patient will benefit from skilled therapeutic intervention in order to improve the following deficits and impairments:  Abnormal gait, Decreased balance, Decreased endurance, Difficulty walking, Dizziness, Decreased activity tolerance, Decreased knowledge of use of DME, Decreased strength, Pain, Decreased cognition  Visit Diagnosis: Muscle weakness (generalized)  Unsteadiness on feet  Other abnormalities of gait and mobility  Difficulty in walking, not elsewhere classified     Problem List Patient Active Problem List   Diagnosis Date Noted   Acute ischemic left PCA stroke ([employer]) 08/03/2021   Cerebellar infarction ([facility]) 07/30/2021   Cerebral edema ([facility]) 07/30/2021   Hyperlipidemia 07/30/2021   Prediabetes 07/30/2021    Cruzita Lederer Mclain Freer, PT, DPT 09/03/2021, 11:51 AM  [facility] [address] [place], [place], [address] Phone: [phone]  Fax:  [phone]  Name: [name] MRN: [id number] Date of Birth: 1961-03-01

## 2021-09-05 ENCOUNTER — Ambulatory Visit: Payer: 59 | Admitting: Speech Pathology

## 2021-09-05 ENCOUNTER — Encounter: Payer: Self-pay | Admitting: Occupational Therapy

## 2021-09-05 ENCOUNTER — Other Ambulatory Visit: Payer: Self-pay

## 2021-09-05 ENCOUNTER — Ambulatory Visit: Payer: 59 | Admitting: Occupational Therapy

## 2021-09-05 ENCOUNTER — Encounter: Payer: Self-pay | Admitting: Speech Pathology

## 2021-09-05 DIAGNOSIS — I69354 Hemiplegia and hemiparesis following cerebral infarction affecting left non-dominant side: Secondary | ICD-10-CM

## 2021-09-05 DIAGNOSIS — R278 Other lack of coordination: Secondary | ICD-10-CM

## 2021-09-05 DIAGNOSIS — R41841 Cognitive communication deficit: Secondary | ICD-10-CM | POA: Diagnosis not present

## 2021-09-05 DIAGNOSIS — R41840 Attention and concentration deficit: Secondary | ICD-10-CM

## 2021-09-05 DIAGNOSIS — R41842 Visuospatial deficit: Secondary | ICD-10-CM

## 2021-09-05 DIAGNOSIS — R2681 Unsteadiness on feet: Secondary | ICD-10-CM

## 2021-09-05 DIAGNOSIS — M6281 Muscle weakness (generalized): Secondary | ICD-10-CM

## 2021-09-05 NOTE — Patient Instructions (Signed)
°  When Ronette is working on a task do not interrupt her - she needs to focus on the job   Amariah is processing steps and conversation a little slower - when you giver her lots of directions or cues at once, it is overwhelming and not helpful - if needed for safety, say 2-3 words at a time  For example at the [place], let her do, then at the end, cue her to look at her clothes and make sure she has them - let her do the thinking, you are there to monitor and just double check at the very end  Rather than pointing out what she did wrong, say "hey, take another look at that" or "double check that" and let her do the problem solving  With friends or boyfriend, let them know "The doctor said I need some alone time to rest my brain" or "The doctor said I can only visit for 20 minutes"   Get the persons attention before you speak  Use eye contact and face the person you are speaking to  Be in close proximity to the person you are speaking to  Turn down any noise in the environment such as the TV, walk away from loud appliances, air conditioners, fans, dish washers etc  I Tips to help facilitate better attention, concentration, focus   Do harder, longer tasks when you are most alert/awake  Break down larger tasks into small parts  Limit distractions of TV, radio, conversation, e mails/texts, appliance noise, etc - if a job is important, do it in a quiet room  Be aware of how you are functioning in high stimulation environments such as large stores, parties, restaurants - any place with lots of lights, noise, signs etc  Group conversations may be more difficult to process than one on one conversations  Give yourself extra time to process conversation, reading materials, directions or information from your healthcare providers  Organization is key - clutters of laundry, mail, paperwork, dirty dishes - all make it more difficult to concentrate  Before you start a task, have all the needed  supplies, directions, recipes ready and organized. This way you don't have to go looking for something in the middle of a task and become distracted.   Be aware of fatigue - take rests or breaks when needed to re-group and re-focus  With the grocery store, get in and get out - it is a high stimulation environment and your brain is working really hard to filter out the lights, music, high heels clicking, babies crying, people coughing and all of the products are bright to get your attention - you brain will get overwhelmed and agitated

## 2021-09-05 NOTE — Patient Instructions (Signed)
°  Coordination Activities  Perform the following activities for 10 minutes 1-2 times per day with left hand(s).  Flip cards 1 at a time as fast as you can. Rotate card in hand (clockwise and counter-clockwise). Shuffle cards. Pick up coins and stack. Twirl pen between fingers.  Sort cards in a deck by suit - hearts, diamonds, spades, clubs Sort cards in a deck from low to high

## 2021-09-05 NOTE — Therapy (Addendum)
[facility] [address] [place], [place], [address] Phone: [phone]   Fax:  [phone]  Speech Language Pathology Treatment  Patient Details  Name: [name] MRN: [id number] Date of Birth: 11-Oct-1960 Referring Provider (SLP): Kingsley Callander., MD   Encounter Date: 09/05/2021   End of Session - 09/05/21 1222     Visit Number 2    Number of Visits 25    Date for SLP Re-Evaluation 11/29/21    SLP Start Time 1100    SLP Stop Time  1140    SLP Time Calculation (min) 40 min    Activity Tolerance Patient tolerated treatment well             Past Medical History:  Diagnosis Date   Blood in stool    Hyperlipidemia    Vaginal fibroids     History reviewed. No pertinent surgical history.  There were no vitals filed for this visit.          ADULT SLP TREATMENT - 09/05/21 1125       General Information   Behavior/Cognition Alert;Cooperative;Pleasant mood      Treatment Provided   Treatment provided Cognitive-Linquistic      Cognitive-Linquistic Treatment   Treatment focused on Cognition;Patient/family/caregiver education    Skilled Treatment Completed CLQT - see Clinical Impressions. Targeted family education for how to support [name]'s memory and attention by limiting interruptions, using short, simple cues or questions as needed to let her process and problem solve rather than jumping in to fix or telling her what she is doing wrong. [name] will educate her brother and boyfriend re: these strategies. She is getting distracted, overwhelmed and agitated when they interrupt with verbose instructions or take over. Initiate compensations for memory and attention     Assessment / Recommendations / Plan   Plan Continue with current plan of care      Progression Toward Goals   Progression toward goals Progressing toward goals              SLP Education - 09/05/21 1216     Education Details family  and environmental aids to support attention, memory and processing    Person(s) Educated Patient    Methods Explanation;Handout;Verbal cues;Demonstration    Comprehension Verbal cues required;Need further instruction              SLP Short Term Goals - 09/05/21 1221       SLP SHORT TERM GOAL #1   Title Pt will use comensatory strategies for memory successfully between 3 sessions    Time 4    Period Weeks    Status On-going    Target Date 09/28/21      SLP SHORT TERM GOAL #2   Title Pt will demonstrate adeuqate organizational skills to complete mod complex therapy tasks with modified independence (compensations) in 2 sessions    Time 4    Period Weeks    Status On-going    Target Date 09/28/21      SLP SHORT TERM GOAL #3   Title pt will demonstrate emergent awareness during a therapy task in order to make corrections in 3 sessions    Time 4    Period Weeks    Status On-going    Target Date 09/28/21      SLP SHORT TERM GOAL #4   Title pt will complete cognitive testing in first 1-2 sessions    Time 2    Period --   sessions  Target Date 09/14/21              SLP Long Term Goals - 09/05/21 1221       SLP LONG TERM GOAL #1   Title Pt will demonstrate adequate organizational skills to complete mod complex/complex therapy tasks with modified independence (compensations) in 4 sessions    Time 8    Period Weeks    Status On-going      SLP LONG TERM GOAL #2   Title pt will demo anticipatory awareness by pre-planning/organizing a task prior to initiation, independently, in 3 sessions    Time 8    Period Weeks    Status On-going      SLP LONG TERM GOAL #3   Title Pt will demonstrate adequate organizational skills to complete mod complex/complex therapy tasks with modified independence (compensations) in 4 sessions    Time 12    Period Weeks    Status On-going      SLP LONG TERM GOAL #4   Title pt will independently use memory strategies for work-like tasks  in 5 sessions    Time 12    Period Weeks    Status On-going              Plan - 09/05/21 1216     Clinical Impression Statement [name] presents with moderate cognitive linguistic impairments. CLQT results: attention - mild impiarment; memory - severe impairment, executive function - moderate impairment; language  -mild impairment, visuospatial skills - moderate impairment. Of note, [name] did correctly ID 3/6 designs, however well over the time allotment. She also completed the 1st maze, agian over the alltoted time, indicating slow processing. She endorses this in conversations and getting overwhelmed when family give her many directions or feedback at once. Initiated training in enviromental and family behaviours to support [name]'s attention, memory and processing. See pt instructions. Continue skilled ST to maximize cogntive communication for possible return to work in some capacity (Kanyah's goal), for safety and to caregive for her mother.    Speech Therapy Frequency 2x / week    Duration 12 weeks    Treatment/Interventions Compensatory techniques;Environmental controls;SLP instruction and feedback;Cueing hierarchy;Cognitive reorganization;Compensatory strategies;Patient/family education;Internal/external aids    Potential to Achieve Goals Good             Patient will benefit from skilled therapeutic intervention in order to improve the following deficits and impairments:   Cognitive communication deficit    Problem List Patient Active Problem List   Diagnosis Date Noted   Acute ischemic left PCA stroke ([facility]) 08/03/2021   Cerebellar infarction ([facility]) 07/30/2021   Cerebral edema ([facility]) 07/30/2021   Hyperlipidemia 07/30/2021   Prediabetes 07/30/2021    Amandeep Hogston, Annye Rusk, CCC-SLP 09/05/2021, 12:22 PM  [facility] [address] [place], [place], [address] Phone: [phone]   Fax:  [phone]   Name: [name] MRN: [id number] Date of Birth: August 06, 1960

## 2021-09-05 NOTE — Therapy (Signed)
[facility] [address] [place], [place], [address] Phone: [phone]   Fax:  [phone]  Occupational Therapy Treatment  Patient Details  Name: [name] MRN: [id number] Date of Birth: [birth year] Referring Provider (OT): Jinger Neighbors, MD   Encounter Date: 09/05/2021   OT End of Session - 09/05/21 1110     Visit Number 2    Number of Visits 17    Date for OT Re-Evaluation 11/09/21    Authorization Type U[employer]    Authorization Time Period VL: 60 combined (hard max), No Auth Req'd    Authorization - Number of Visits 20    OT Start Time 1017    OT Stop Time 1100    OT Time Calculation (min) 43 min    Activity Tolerance Patient tolerated treatment well    Behavior During Therapy WFL for tasks assessed/performed             Past Medical History:  Diagnosis Date   Blood in stool    Hyperlipidemia    Vaginal fibroids     History reviewed. No pertinent surgical history.  There were no vitals filed for this visit.   Subjective Assessment - 09/05/21 1022     Subjective  Patient indicates that she was able to use both hands for washing and folding clothes.    Pertinent History hyperlipidemia,  prediabetes and tobacco use.    Limitations Fall Risk    Currently in Pain? No/denies    Pain Score 0-No pain                          OT Treatments/Exercises (OP) - 09/05/21 0001       ADLs   Cooking Patient reported momentary dizziness when reaching down into bottom drawer of refrigerator.  Patient indicates that dizziness is resolving, but she still has moments - can determine worse when she moves quickly.    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  Informed patient that she would need MD clearance to drive, and that she needed time for healing (delayed response time, decreased initiaition, and decreased visual acuity  and scanning) Not yet ready for driving.    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  She was responsible for managing shipment of pharmaceutical orders, training others, managing people.  She describes the work as physically demanding  - moving around all night.  Patient improving but still displays decreased awareness/insight, delayed processing, and memory difficulties.    ADL Comments Reviewed OT Short and Long Term Goals.  Patiet is in agreement with OT goals especially as they relate to going back to work.      Exercises   Exercises Hand      Hand Exercises   Other Hand Exercises Designed simple HEP for non-dominant LUE coordination.  Patient able to complete in clinic, and has items at home to use.  Patient eager for improved performance with LUE - lacks stamina and strength.                    OT Education - 09/05/21 1110     Education Details OT GOals, HEP FM Coord- LUE    Person(s) Educated Patient    Methods Explanation;Demonstration;Tactile cues;Verbal cues;Handout    Comprehension Verbalized understanding;Returned demonstration;Need further instruction  OT Short Term Goals - 09/05/21 1113       OT SHORT TERM GOAL #1   Title Pt will be independent with initial HEP    Time 4    Period Weeks    Status On-going    Target Date 09/28/21      OT SHORT TERM GOAL #2   Title Pt will verbalize understanding of memory compensatory strategies    Time 4    Period Weeks    Status On-going      OT SHORT TERM GOAL #3   Title Pt will perform environmental scanning in moderately distracting environment with 90% accuracy or greater.    Time 4    Period Weeks    Status On-going      OT SHORT TERM GOAL #4   Title Pt will increase grip strength by 5 lbs or greater in LUE.    Baseline L 19.6, R 35    Time 4    Period Weeks    Status On-going      OT SHORT TERM GOAL #5   Title Pt will improve 9 hole peg  test by 3 seconds or more in BUE for increase in fine motor coordination and processing speed    Baseline R 54.37s, L 45.37s    Time 4    Period Weeks    Status On-going               OT Long Term Goals - 09/05/21 1114       OT LONG TERM GOAL #1   Title Pt will be independent with updated HEP    Time 10    Period Weeks    Status On-going    Target Date 11/09/21      OT LONG TERM GOAL #2   Title Pt will increase 9 hole peg test score with BUE by 8 seconds or greater in order to increase fine motor coordination.    Baseline R 54.37s, L 45.37s    Time 10    Period Weeks    Status On-going      OT LONG TERM GOAL #3   Title Pt will be increase grip strength in LUE by 10 lbs or greater    Baseline L 19.6, R 35    Time 10    Period Weeks    Status On-going      OT LONG TERM GOAL #4   Title Pt will complete alternating attention task with 90% accuracy or greater and with appropriate time.    Baseline Trail Making B > 5 minutes with 100% accuracy    Time 10    Period Weeks    Status On-going      OT LONG TERM GOAL #5   Title Pt will increase shoulder range of motion in LUE to 130 degrees or greater for increasing ability for overhead reach.    Baseline 125 degreees    Time 10    Period Weeks    Status On-going      OT LONG TERM GOAL #6   Title Complete FOTO at discharge with score of 70% or greater.    Baseline 62%    Time 10    Period Weeks    Status On-going                   Plan - 09/05/21 1111     Clinical Impression Statement Pt is showing improved indepedence with ADL, and even starting some IADL activities  at home.  Patient is eager for improved performance.  Patient agrees with OT goals.    OT Occupational Profile and History Problem Focused Assessment - Including review of records relating to presenting problem    Occupational performance deficits (Please refer to evaluation for details): ADL's;IADL's;Leisure;Work    Marketing executive / Function  / Physical Skills ADL;Decreased knowledge of precautions;Coordination;Decreased knowledge of use of DME;Strength;GMC;IADL;ROM;FMC;UE functional use;Dexterity;Flexibility;Vision    Cognitive Skills Attention;Problem Solve    Rehab Potential Good    Clinical Decision Making Limited treatment options, no task modification necessary    Comorbidities Affecting Occupational Performance: None    Modification or Assistance to Complete Evaluation  No modification of tasks or assist necessary to complete eval    OT Frequency 2x / week    OT Duration Other (comment)   16 visits over 10 weeks   OT Treatment/Interventions Aquatic Therapy;Self-care/ADL training;Moist Heat;Fluidtherapy;DME and/or AE instruction;Therapeutic activities;Therapeutic exercise;Cognitive remediation/compensation;Visual/perceptual remediation/compensation;Passive range of motion;Neuromuscular education;Functional Mobility Training;Energy conservation;Patient/family education    Plan Issue Theraputty LUE, Check- coordination HEP, Memory Compensation Strategies - she is using a notebook - started to talk about adding appointments on phone - this seemed to overwhelm her a little.    OT Home Exercise Plan FM Coordination issued 09/05/21    Consulted and Agree with Plan of Care Patient             Patient will benefit from skilled therapeutic intervention in order to improve the following deficits and impairments:   Body Structure / Function / Physical Skills: ADL, Decreased knowledge of precautions, Coordination, Decreased knowledge of use of DME, Strength, GMC, IADL, ROM, FMC, UE functional use, Dexterity, Flexibility, Vision Cognitive Skills: Attention, Problem Solve     Visit Diagnosis: Muscle weakness (generalized)  Unsteadiness on feet  Hemiplegia and hemiparesis following cerebral infarction affecting left non-dominant side (HCC)  Other lack of coordination  Visuospatial deficit  Attention and concentration  deficit    Problem List Patient Active Problem List   Diagnosis Date Noted   Acute ischemic left PCA stroke ([place]) 08/03/2021   Cerebellar infarction ([facility]) 07/30/2021   Cerebral edema ([place]) 07/30/2021   Hyperlipidemia 07/30/2021   Prediabetes 07/30/2021    Mariah Milling, OT 09/05/2021, 11:16 AM  [facility] [address] [place], [place], [address] Phone: [phone]   Fax:  [phone]  Name: ANTOINE FIALLOS MRN: [id number] Date of Birth: 09/11/1960

## 2021-09-10 ENCOUNTER — Encounter: Payer: Self-pay | Admitting: Physical Medicine and Rehabilitation

## 2021-09-10 ENCOUNTER — Encounter: Payer: 59 | Attending: Physical Medicine and Rehabilitation | Admitting: Physical Medicine and Rehabilitation

## 2021-09-10 ENCOUNTER — Ambulatory Visit: Payer: 59 | Admitting: Occupational Therapy

## 2021-09-10 ENCOUNTER — Ambulatory Visit: Payer: 59 | Admitting: Physical Therapy

## 2021-09-10 ENCOUNTER — Encounter: Payer: Self-pay | Admitting: Physical Therapy

## 2021-09-10 ENCOUNTER — Other Ambulatory Visit: Payer: Self-pay

## 2021-09-10 ENCOUNTER — Encounter: Payer: Self-pay | Admitting: Occupational Therapy

## 2021-09-10 VITALS — BP 130/78 | HR 77 | Temp 97.8°F | Ht 63.0 in | Wt 154.0 lb

## 2021-09-10 VITALS — BP 119/63 | HR 64

## 2021-09-10 DIAGNOSIS — R41842 Visuospatial deficit: Secondary | ICD-10-CM

## 2021-09-10 DIAGNOSIS — I6310 Cerebral infarction due to embolism of unspecified precerebral artery: Secondary | ICD-10-CM | POA: Diagnosis present

## 2021-09-10 DIAGNOSIS — E785 Hyperlipidemia, unspecified: Secondary | ICD-10-CM | POA: Diagnosis present

## 2021-09-10 DIAGNOSIS — R41841 Cognitive communication deficit: Secondary | ICD-10-CM | POA: Diagnosis not present

## 2021-09-10 DIAGNOSIS — M6281 Muscle weakness (generalized): Secondary | ICD-10-CM

## 2021-09-10 DIAGNOSIS — R41840 Attention and concentration deficit: Secondary | ICD-10-CM

## 2021-09-10 DIAGNOSIS — H538 Other visual disturbances: Secondary | ICD-10-CM | POA: Diagnosis not present

## 2021-09-10 DIAGNOSIS — I69354 Hemiplegia and hemiparesis following cerebral infarction affecting left non-dominant side: Secondary | ICD-10-CM

## 2021-09-10 DIAGNOSIS — R2681 Unsteadiness on feet: Secondary | ICD-10-CM

## 2021-09-10 DIAGNOSIS — R278 Other lack of coordination: Secondary | ICD-10-CM

## 2021-09-10 MED ORDER — ASPIRIN 81 MG PO TBEC: 81.0000 mg | DELAYED_RELEASE_TABLET | Freq: Every day | ORAL | 11 refills | Status: AC

## 2021-09-10 MED ORDER — EYE DROPS ADVANCED RELIEF 0.05-0.1-1-1 % OP SOLN: 1.0000 [drp] | Freq: Two times a day (BID) | OPHTHALMIC | 0 refills | Status: DC

## 2021-09-10 MED ORDER — ROSUVASTATIN CALCIUM 20 MG PO TABS: 20.0000 mg | ORAL_TABLET | Freq: Every day | ORAL | 3 refills | Status: DC

## 2021-09-10 NOTE — Therapy (Signed)
[facility] [address] [place], [place], [address] Phone: [phone]   Fax:  [phone]  Occupational Therapy Treatment  Patient Details  Name: [name] MRN: [id number] Date of Birth: 1960/09/15 Referring Provider (OT): Jinger Neighbors, MD   Encounter Date: 09/10/2021   OT End of Session - 09/10/21 0806     Visit Number 3    Number of Visits 17    Date for OT Re-Evaluation 11/09/21    Authorization Type UHC    Authorization Time Period VL: 60 combined (hard max), No Auth Req'd    Authorization - Number of Visits 20    OT Start Time 0804    OT Stop Time 0847    OT Time Calculation (min) 43 min    Activity Tolerance Patient tolerated treatment well    Behavior During Therapy [facility] for tasks assessed/performed             Past Medical History:  Diagnosis Date   Blood in stool    Hyperlipidemia    Vaginal fibroids     History reviewed. No pertinent surgical history.  There were no vitals filed for this visit.   Subjective Assessment - 09/10/21 0804     Subjective  Patient indicates that she was able to use both hands for washing and folding clothes.    Pertinent History hyperlipidemia,  prediabetes and tobacco use.    Limitations Fall Risk    Currently in Pain? Yes    Pain Score 1     Pain Location Neck    Pain Descriptors / Indicators Sore    Pain Type Acute pain    Pain Frequency Intermittent    Aggravating Factors  using cell phone, tensing shoulders    Pain Relieving Factors relaxing              Placing small pegs in pegboard to copy design (for attention/problem-solving, visual scanning) with min difficulty and incr time with coordination and copied design with good accuracy.        OT Education - 09/10/21 2774     Education Details Memory/cognitive compensation strategies; Red Putty HEP--see pt instructions    Person(s) Educated Patient    Methods  Explanation;Demonstration;Verbal cues;Handout    Comprehension Verbalized understanding;Returned demonstration;Verbal cues required;Need further instruction              OT Short Term Goals - 09/05/21 1113       OT SHORT TERM GOAL #1   Title Pt will be independent with initial HEP    Time 4    Period Weeks    Status On-going    Target Date 09/28/21      OT SHORT TERM GOAL #2   Title Pt will verbalize understanding of memory compensatory strategies    Time 4    Period Weeks    Status On-going      OT SHORT TERM GOAL #3   Title Pt will perform environmental scanning in moderately distracting environment with 90% accuracy or greater.    Time 4    Period Weeks    Status On-going      OT SHORT TERM GOAL #4   Title Pt will increase grip strength by 5 lbs or greater in LUE.    Baseline L 19.6, R 35    Time 4    Period Weeks    Status On-going      OT SHORT TERM GOAL #5   Title Pt will improve  9 hole peg test by 3 seconds or more in BUE for increase in fine motor coordination and processing speed    Baseline R 54.37s, L 45.37s    Time 4    Period Weeks    Status On-going               OT Long Term Goals - 09/05/21 1114       OT LONG TERM GOAL #1   Title Pt will be independent with updated HEP    Time 10    Period Weeks    Status On-going    Target Date 11/09/21      OT LONG TERM GOAL #2   Title Pt will increase 9 hole peg test score with BUE by 8 seconds or greater in order to increase fine motor coordination.    Baseline R 54.37s, L 45.37s    Time 10    Period Weeks    Status On-going      OT LONG TERM GOAL #3   Title Pt will be increase grip strength in LUE by 10 lbs or greater    Baseline L 19.6, R 35    Time 10    Period Weeks    Status On-going      OT LONG TERM GOAL #4   Title Pt will complete alternating attention task with 90% accuracy or greater and with appropriate time.    Baseline Trail Making B > 5 minutes with 100% accuracy    Time  10    Period Weeks    Status On-going      OT LONG TERM GOAL #5   Title Pt will increase shoulder range of motion in LUE to 130 degrees or greater for increasing ability for overhead reach.    Baseline 125 degreees    Time 10    Period Weeks    Status On-going      OT LONG TERM GOAL #6   Title Complete FOTO at discharge with score of 70% or greater.    Baseline 62%    Time 10    Period Weeks    Status On-going                   Plan - 09/10/21 2119     Clinical Impression Statement Pt is progressing slowly towards goals.  Pt reports performing coordination HEP at home, but memory/cognition deficits are pt's primary concern.    OT Occupational Profile and History Problem Focused Assessment - Including review of records relating to presenting problem    Occupational performance deficits (Please refer to evaluation for details): ADL's;IADL's;Leisure;Work    Marketing executive / Function / Physical Skills ADL;Decreased knowledge of precautions;Coordination;Decreased knowledge of use of DME;Strength;GMC;IADL;ROM;FMC;UE functional use;Dexterity;Flexibility;Vision    Cognitive Skills Attention;Problem Solve    Rehab Potential Good    Clinical Decision Making Limited treatment options, no task modification necessary    Comorbidities Affecting Occupational Performance: None    Modification or Assistance to Complete Evaluation  No modification of tasks or assist necessary to complete eval    OT Frequency 2x / week    OT Duration Other (comment)   16 visits over 10 weeks   OT Treatment/Interventions Aquatic Therapy;Self-care/ADL training;Moist Heat;Fluidtherapy;DME and/or AE instruction;Therapeutic activities;Therapeutic exercise;Cognitive remediation/compensation;Visual/perceptual remediation/compensation;Passive range of motion;Neuromuscular education;Functional Mobility Training;Energy conservation;Patient/family education    Plan review memory compensation strategies    OT Home  Exercise Plan FM Coordination issued 09/05/21    Consulted and Agree with Plan of Care  Patient             Patient will benefit from skilled therapeutic intervention in order to improve the following deficits and impairments:   Body Structure / Function / Physical Skills: ADL, Decreased knowledge of precautions, Coordination, Decreased knowledge of use of DME, Strength, GMC, IADL, ROM, FMC, UE functional use, Dexterity, Flexibility, Vision Cognitive Skills: Attention, Problem Solve     Visit Diagnosis: Other lack of coordination  Visuospatial deficit  Attention and concentration deficit  Hemiplegia and hemiparesis following cerebral infarction affecting left non-dominant side (HCC)  Unsteadiness on feet    Problem List Patient Active Problem List   Diagnosis Date Noted   Acute ischemic left PCA stroke ([place]) 08/03/2021   Cerebellar infarction ([place]) 07/30/2021   Cerebral edema ([place]) 07/30/2021   Hyperlipidemia 07/30/2021   Prediabetes 07/30/2021    Claudeen Leason, OT 09/10/2021, 8:58 AM  [facility] [address] [place], [place], [address] Phone: [phone]   Fax:  [phone]  Name: SHAVONA GUNDERMAN MRN: [id number] Date of Birth: 07-Feb-1961  Vianne Bulls, OTR/L [facility] [address] [place], [place]  [address] [phone] phone [phone] 09/10/21 8:58 AM

## 2021-09-10 NOTE — Patient Instructions (Addendum)
Bioptemizer's: All 7 types of magnesium Magnesium glycinate  Provided with list of supplements that can help with dyslipidemia: 1) Vitamin B3 500-4,000mg  in divided doses daily (would recommend starting low as can cause uncomfortable facial flushing if started at too high a dose) 2) Phytosterols 2.15 grams daily 3) Fermented soy 30-50 grams daily 4) EGCG (found in green tea): 500-1000mg  daily 5) Omega-3 fatty acids 3000-5,000mg  daily 6) Flax seed 40 grams daily 7) Monounsaturated fats 20-40 grams daily (olives, olive oil, nuts), also reduces cardiovascular disease 8) Sesame: 40 grams daily 9) Gamma/delta tocotrienols- a family of unsaturated forms of Vitamin E- 200mg  with dinner 10) Pantethine 900mg  daily in divided doses 11) Resveratrol 250mg  daily 12) N Acetyl Cysteine 2000mg  daily in divided doses 13) Curcumin 2000-5000mg  in divided doses daily 14) Pomegranate juice: 8 ounces daily, also helps to lower blood pressure 15) Pomegranate seeds one cup daily, also helps to lower blood pressure 16) Citrus Bergamot 1000mg  daily, also helps with glucose control and weight loss 17) Vitamin C 500mg  daily 18) Quercetin 500-1000mg  daily 19) Glutathione 20) Probiotics 60-100 billion organisms per day 21) Fiber 22) Oats 23) Aged garlic (can eat as food or supplement of 600-900mg  per day) 24) Chia seeds 25 grams per day 25) Lycopene- carotenoid found in high concentrations in tomatoes. 26) Alpha linolenic acid 27) Flavonoids and anthocyanins 28) Wogonin- flavanoid that enhances reverse cholesterol transport 29) Coenzyme Q10 30) Pantethine- derivative of Vitamin B5: 300mg  three times per day or 450mg  twice per day with or without food 31) Barley and other whole grains 32) Orange juice 33) L- carnitine 34) L- Lysine 35) L- Arginine 36) Almonds 37) Morin 38) Rutin 39) Carnosine 40) Histidine  41) Kaempferol  42) Organosulfur compounds 43) Vitamin E 44) Oleic acid 45) RBO (ferulic acid  gammaoryzanol) 46) grape seed extract 47) Red wine 48) Berberine HCL 500mg  daily or twice per day- more effective and with fewer adverse effects that ezetimibe monotherapy 49) red yeast rice 2400- 4800 mg/day 50) chlorella 51) Licorice

## 2021-09-10 NOTE — Patient Instructions (Addendum)
Memory Compensation Strategies  Use WARM strategy. W= write it down A=  associate it R=  repeat it M=  make a mental picture  You can keep a Memory Notebook. Use a 3-ring notebook with sections for the following:  calendar, important names and phone numbers, medications, doctors names/phone numbers, to do list/reminders, and a section to journal what you did each day  Use a calendar to write appointments down.  Write yourself a schedule for the day/make a routine, check off a "to do list" This can be placed on the calendar or in a separate section of the Memory Notebook.  Keeping a regular schedule can help memory.  Use medication organizer with sections for each day or morning/evening pills  You may need help loading it  Keep a basket, or pegboard by the door.   Place items that you need to take out with you in the basket or on the pegboard.  You may also want to include a message board for reminders.  Use sticky notes. Place sticky notes with reminders in a place where the task is performed.  For example:  turn off the stove placed by the stove, lock the door placed on the door at eye level, take your medications on the bathroom mirror or by the place where you normally take your medications  Use alarms/timers.  Use while cooking to remind yourself to check on food or as a reminder to take your medicine, or as a reminder to make a call, or as a reminder to perform another task, etc.  Use a voice memo to record important information and notes for yourself.  10.  Organize and reduce clutter  11.  Get all needed items out before you start a task.                Extension (Assistive Putty)   Roll putty back and forth, being sure to use all fingertips. Repeat 3 times. Do 2 sessions per day.  Then pinch as below.   Palmar Pinch Strengthening (Resistive Putty)   Pinch putty between thumb and each fingertip in turn after rolling out     Grip  Strengthening (Resistive Putty)   Squeeze putty using thumb and all fingers. Repeat 15 times. Do 2 sessions per day.

## 2021-09-10 NOTE — Progress Notes (Signed)
Subjective:    Patient ID: [name], female    DOB: 25-Apr-1961, 61 y.o.   MRN: [id number]  HPI 1) CVA -using walker in the halls but her hallways are too small so she often has to use the walls.  -she uses the rolling walker outside -she has been able to throw trash and start fixing breakfasts -boy friend has been fixing her other meals.  2) Red eyes -went to the eye doctor and got neomycin and this helps  3) Anxiety: -felt like she couldn't focus when she was in the grocery store  4) Dizziness -more present when she bends forward to look downward.  -associated with blurry vision    Pain Inventory Average Pain 3 Pain Right Now 3 My pain is intermittent and soreness  LOCATION OF PAIN  neck  BOWEL Number of stools per week: 7   BLADDER Normal I   Mobility use a walker how many minutes can you walk? unknown ability to climb steps?  yes do you drive?  no Do you have any goals in this area?  yes  Function employed # of hrs/week on medical leave since 2/12/223 I need assistance with the following:  shopping Do you have any goals in this area?  yes  Neuro/Psych trouble walking dizziness confusion  Prior Studies Any changes since last visit?  no  Physicians involved in your care Any changes since last visit?  no   Family History  Problem Relation Age of Onset   Diabetes Father    Hypertension Father    Heart disease Father    Hyperlipidemia Father    Stroke Father    Cancer Neg Hx    Social History   Socioeconomic History   Marital status: Single    Spouse name: Not on file   Number of children: Not on file   Years of education: 12   Highest education level: Not on file  Occupational History    Employer: [employer]  Tobacco Use   Smoking status: Former   Smokeless tobacco: Not on file  Substance and Sexual Activity   Alcohol use: No   Drug use: No   Sexual activity: Yes    Birth control/protection: Condom  Other Topics  Concern   Not on file  Social History Narrative   Regular exercise-yes   Caffeine Use-yes   Social Determinants of Health   Financial Resource Strain: Not on file  Food Insecurity: Not on file  Transportation Needs: Not on file  Physical Activity: Not on file  Stress: Not on file  Social Connections: Not on file   No past surgical history on file. Past Medical History:  Diagnosis Date   Blood in stool    Hyperlipidemia    Vaginal fibroids    LMP 07/15/2009   Opioid Risk Score:   Fall Risk Score:  `1  Depression screen PHQ 2/9  Depression screen PHQ 2/9 06/17/2012  Decreased Interest 0  Down, Depressed, Hopeless 0  PHQ - 2 Score 0    Review of Systems  Musculoskeletal:  Positive for gait problem and neck pain.  Neurological:  Positive for dizziness.  Psychiatric/Behavioral:  Positive for confusion.   All other systems reviewed and are negative.     Objective:   Physical Exam  Gen: no distress, normal appearing, BMI 27.28 HEENT: oral mucosa pink and moist, NCAT Cardio: Reg rate Chest: normal effort, normal rate of breathing Abd: soft, non-distended Ext: no edema Psych: pleasant, normal affect Skin:  intact Neuro/Musculoskeletal: Ambulating with assistive device      Assessment & Plan:   1) CVA -continue therapies -would benefit from handicap placard to increase mobility in the community -reviewed all medications and provided necessary refills.  2) Blurry vision -discussed potential CN 4 dysfunction -referred to neuro-ophthalmology  3) HLD -refilled crestor -reviewed lipid panel with her Provided with list of supplements that can help with dyslipidemia: 1) Vitamin B3 500-4,000mg  in divided doses daily (would recommend starting low as can cause uncomfortable facial flushing if started at too high a dose) 2) Phytosterols 2.15 grams daily 3) Fermented soy 30-50 grams daily 4) EGCG (found in green tea): 500-1000mg  daily 5) Omega-3 fatty acids  3000-5,000mg  daily 6) Flax seed 40 grams daily 7) Monounsaturated fats 20-40 grams daily (olives, olive oil, nuts), also reduces cardiovascular disease 8) Sesame: 40 grams daily 9) Gamma/delta tocotrienols- a family of unsaturated forms of Vitamin E- 200mg  with dinner 10) Pantethine 900mg  daily in divided doses 11) Resveratrol 250mg  daily 12) N Acetyl Cysteine 2000mg  daily in divided doses 13) Curcumin 2000-5000mg  in divided doses daily 14) Pomegranate juice: 8 ounces daily, also helps to lower blood pressure 15) Pomegranate seeds one cup daily, also helps to lower blood pressure 16) Citrus Bergamot 1000mg  daily, also helps with glucose control and weight loss 17) Vitamin C 500mg  daily 18) Quercetin 500-1000mg  daily 19) Glutathione 20) Probiotics 60-100 billion organisms per day 21) Fiber 22) Oats 23) Aged garlic (can eat as food or supplement of 600-900mg  per day) 24) Chia seeds 25 grams per day 25) Lycopene- carotenoid found in high concentrations in tomatoes. 26) Alpha linolenic acid 27) Flavonoids and anthocyanins 28) Wogonin- flavanoid that enhances reverse cholesterol transport 29) Coenzyme Q10 30) Pantethine- derivative of Vitamin B5: 300mg  three times per day or 450mg  twice per day with or without food 31) Barley and other whole grains 32) Orange juice 33) L- carnitine 34) L- Lysine 35) L- Arginine 36) Almonds 37) Morin 38) Rutin 39) Carnosine 40) Histidine  41) Kaempferol  42) Organosulfur compounds 43) Vitamin E 44) Oleic acid 45) RBO (ferulic acid gammaoryzanol) 46) grape seed extract 47) Red wine 48) Berberine HCL 500mg  daily or twice per day- more effective and with fewer adverse effects that ezetimibe monotherapy 49) red yeast rice 2400- 4800 mg/day 50) chlorella 51) Licorice

## 2021-09-10 NOTE — Therapy (Signed)
[facility] [address] [place], [place], [address] Phone: [phone]   Fax:  [phone]  Physical Therapy Treatment  Patient Details  Name: [name] MRN: [id number] Date of Birth: [birth year] Referring Provider (PT): Ranell Patrick Clide Deutscher, MD   Encounter Date: 09/10/2021   PT End of Session - 09/10/21 0725     Visit Number 3    Number of Visits 17    Date for PT Re-Evaluation 11/23/21   pushed out due to scheduling   Authorization Type UHC (VL: 60 per discipline)    PT Start Time 0721   pt late to session   PT Stop Time 0800    PT Time Calculation (min) 39 min    Equipment Utilized During Treatment Gait belt    Activity Tolerance Patient tolerated treatment well    Behavior During Therapy WFL for tasks assessed/performed             Past Medical History:  Diagnosis Date   Blood in stool    Hyperlipidemia    Vaginal fibroids     History reviewed. No pertinent surgical history.  Vitals:   09/10/21 0726  BP: 119/63  Pulse: 64     Subjective Assessment - 09/10/21 0725     Subjective Exercises are going well at home.    Patient is accompained by: --   Boyfriend   Pertinent History HLD, Prediabetes    Limitations Standing;Walking;House hold activities    Diagnostic tests CT/MRI showed acute infarct right cerebellum and left occipital cortex.  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  55% narrowing at the left vertebral origin.    Patient Stated Goals Improve the Balance    Currently in Pain? No/denies   had a slight headache last night.   Pain Onset [name] Adult PT Treatment/Exercise - 09/10/21 0752       Exercises   Exercises Knee/Hip    Other Exercises  Mini squats x10 reps with no UE support - reviewed from HEP. Cued for  posture and looking ahead.      Knee/Hip Exercises: Aerobic   Stepper With BUE/BLE at gear 1.5 for 7 minutes for strengthening, ROM, and activity tolerance.                 Balance Exercises - 09/10/21 0749       Balance Exercises: Standing   Standing Eyes Opened Narrow base of support (BOS);Limitations    Standing Eyes Opened Limitations Feet together x10 reps head turns, x10 reps head nods    SLS Solid surface;Limitations    SLS Limitations Alternating SLS taps to bottom cabinet x10 reps each leg, no UE support, cues for glute activation and tactile/verbal cues to prevent lateral trunk lean.    Retro Gait 3 reps;Limitations    Retro Gait Limitations Down and back x3 reps with no UE support, cued for weight shift and using mirror to prevent trunk lean.             Access Code: [email or web address] URL: [email or web address] Date: 09/10/2021 Prepared by: Janann August  Exercises Heel to toe walking - 1 x daily - 7 x weekly - 3 sets - 3 reps Backward heel to  toe walking - 1 x daily - 7 x weekly - 3 sets - 3 reps Mini Squats with Walker and Chair - 1 x daily - 7 x weekly - 2 sets - 10 reps  Updates to HEP on 09/10/21:  Standing Marching - 1-2 x daily - 5 x weekly - 3 sets - down and back at countertop, cued for wider BOS and slowed pace for SLS  Romberg Stance with Eyes Closed - 1-2 x daily - 5 x weekly - 3 sets - 30 hold - on level ground.     PT Education - 09/10/21 0905     Education Details Updated HEP    Person(s) Educated Patient    Methods Explanation;Handout;Demonstration    Comprehension Verbalized understanding;Returned demonstration              PT Short Term Goals - 09/03/21 1148       PT SHORT TERM GOAL #1   Title Pt will be indepdent with initial HEP for balance/strength    Baseline no HEP established    Time 4    Period Weeks    Status New    Target Date 09/28/21      PT SHORT TERM GOAL #2   Title Pt will improve TUG to </= 16  seconds with LRAD to demonstrate reduced fall risk    Baseline 22.72 secs with RW    Time 4    Period Weeks    Status New      PT SHORT TERM GOAL #3   Title Patient will improve gait speed to >/= 2.0 ft/sec w/ LRAD to demo improved community mobility    Baseline 1.90 ft/sec    Time 4    Period Weeks    Status New      PT SHORT TERM GOAL #4   Title Pt will improve Berg score to >42/56 for reduced risk of falls    Baseline 35/56    Time 4    Period Weeks    Status Revised      PT SHORT TERM GOAL #5   Title Pt will be able to ambulate >/= 300 ft w/ LRAD and supervision to demo improved household/community mobility    Baseline 120' CGA    Time 4    Period Weeks    Status New               PT Long Term Goals - 09/03/21 1149       PT LONG TERM GOAL #1   Title Pt will be independent with final HEP for balance/strength    Baseline no HEP established    Time 8    Period Weeks    Status New    Target Date 10/26/21      PT LONG TERM GOAL #2   Title Pt will perform >48/56 on Berg for improved dynamic balance and reduced fall risk    Baseline 35/56    Time 8    Period Weeks    Status Revised      PT LONG TERM GOAL #3   Title Pt will improve TUG to </= 12 seconds w/ LRAD to demo improved balance    Baseline 22.72 secs w/ RW    Time 8    Period Weeks    Status New      PT LONG TERM GOAL #4   Title Pt will improve 5x sit <> stand to </= 12 seconds to demo improved balance  Baseline 14.22 secs    Time 8    Period Weeks    Status New      PT LONG TERM GOAL #5   Title Pt will improve gait speed to >/= 2.6 ft/sec to demo improved community ambulator    Baseline 1.90 ft/sec    Time 8    Period Weeks    Status New      PT LONG TERM GOAL #6   Title Pt will be able to ascend/descend 8 stairs with reciprocal pattern and supervision with use of single rail vs. no rail    Baseline step to pattern bil rails    Time 8    Period Weeks    Status New      PT LONG  TERM GOAL #7   Title Pt will improve FOTO to >/= 62%    Baseline 49%    Time 8    Period Weeks    Status New                   Plan - 09/10/21 0910     Clinical Impression Statement Added marching and balance with eyes closed today to pt's HEP with pt tolerating well. Did need verbal cues for wider BOS when performing marching. Pt with incr trunk lean during retro gait and SLS taps - needing tactile cues to maintain midline. Will continue to progress towards LTGs.    Personal Factors and Comorbidities Comorbidity 2;Transportation    Comorbidities HLD, Prediabetes    Examination-Activity Limitations Transfers;Stairs;Stand;Locomotion Level;Squat;Bend    Examination-Participation Restrictions Occupation;Cleaning;Community Activity;Driving    Stability/Clinical Decision Making Stable/Uncomplicated    Rehab Potential Good    PT Frequency 2x / week    PT Duration 8 weeks   plus eval   PT Treatment/Interventions ADLs/Self Care Home Management;Aquatic Therapy;Canalith Repostioning;Cryotherapy;Electrical Stimulation;Moist Heat;DME Instruction;Gait training;Stair training;Functional mobility training;Therapeutic activities;Therapeutic exercise;Balance training;Neuromuscular re-education;Patient/family education;Orthotic Fit/Training;Manual techniques;Passive range of motion;Vestibular;Energy conservation    PT Next Visit Plan how is HEP? update as appropriate. midline orientation using biofeedback, single leg stance, dual tasks. BLE strengthening.    PT Home Exercise Plan [email or web address]    Consulted and Agree with Plan of Care Patient             Patient will benefit from skilled therapeutic intervention in order to improve the following deficits and impairments:  Abnormal gait, Decreased balance, Decreased endurance, Difficulty walking, Dizziness, Decreased activity tolerance, Decreased knowledge of use of DME, Decreased strength, Pain, Decreased cognition  Visit Diagnosis: Muscle  weakness (generalized)  Unsteadiness on feet     Problem List Patient Active Problem List   Diagnosis Date Noted   Acute ischemic left PCA stroke ([place]) 08/03/2021   Cerebellar infarction ([place]) 07/30/2021   Cerebral edema ([place]) 07/30/2021   Hyperlipidemia 07/30/2021   Prediabetes 07/30/2021    Arliss Journey, PT, DPT  09/10/2021, 9:11 AM  [facility] [address] [place], [place], [address] Phone: [phone]   Fax:  [phone]  Name: [name] MRN: [id number] Date of Birth: 1960-09-01

## 2021-09-12 ENCOUNTER — Other Ambulatory Visit: Payer: Self-pay

## 2021-09-12 ENCOUNTER — Ambulatory Visit: Payer: 59 | Admitting: Physical Therapy

## 2021-09-12 ENCOUNTER — Encounter: Payer: Self-pay | Admitting: Speech Pathology

## 2021-09-12 ENCOUNTER — Ambulatory Visit: Payer: 59 | Admitting: Speech Pathology

## 2021-09-12 ENCOUNTER — Encounter: Payer: Self-pay | Admitting: Occupational Therapy

## 2021-09-12 ENCOUNTER — Ambulatory Visit: Payer: 59 | Attending: Internal Medicine | Admitting: Occupational Therapy

## 2021-09-12 DIAGNOSIS — R2681 Unsteadiness on feet: Secondary | ICD-10-CM | POA: Insufficient documentation

## 2021-09-12 DIAGNOSIS — R2689 Other abnormalities of gait and mobility: Secondary | ICD-10-CM

## 2021-09-12 DIAGNOSIS — R41841 Cognitive communication deficit: Secondary | ICD-10-CM

## 2021-09-12 DIAGNOSIS — R41840 Attention and concentration deficit: Secondary | ICD-10-CM | POA: Insufficient documentation

## 2021-09-12 DIAGNOSIS — R41842 Visuospatial deficit: Secondary | ICD-10-CM | POA: Insufficient documentation

## 2021-09-12 DIAGNOSIS — M6281 Muscle weakness (generalized): Secondary | ICD-10-CM | POA: Diagnosis present

## 2021-09-12 DIAGNOSIS — I69354 Hemiplegia and hemiparesis following cerebral infarction affecting left non-dominant side: Secondary | ICD-10-CM | POA: Diagnosis present

## 2021-09-12 DIAGNOSIS — R262 Difficulty in walking, not elsewhere classified: Secondary | ICD-10-CM | POA: Insufficient documentation

## 2021-09-12 DIAGNOSIS — R278 Other lack of coordination: Secondary | ICD-10-CM | POA: Diagnosis not present

## 2021-09-12 NOTE — Patient Instructions (Addendum)
?  3 ring binder - I can hole punch here - section for PT, OT ST ? ?[name] needs extra time to get in and out of the car and extra time to walk in ? ?When she feels rushed she is more likely to make mistakes, so giving her extra time to get to appointments  ? ?She needs to leave 30 minutes before her appointment  ? ?Great job listening to your body - you will have good days and harder days ? ?When you are able, try to push through some fatigue for an extra 15 minutes to build endurance and stamina ? ?Getting agitated a little easier now is normal because your brain can get overwhelmed as it is healing. Listen to your body and take a break - try to focus on your breath with eyes closed -  ? ?Free app - Breathe2Relax ? ?Use a grocery list on the fridge with an attached pencil and instruct mom to write down what she needs ?

## 2021-09-12 NOTE — Therapy (Signed)
[facility] ?[place] ?[place], [place], [address] ?Phone: [phone]   Fax:  [phone] ? ?Occupational Therapy Treatment ? ?Patient Details  ?Name: [name] ?MRN: [id number] ?Date of Birth: 1960/11/20 ?Referring Provider (OT): Jinger Neighbors, MD ? ? ?Encounter Date: 09/12/2021 ? ? OT End of Session - 09/12/21 0939   ? ? Visit Number 4   ? Number of Visits 17   ? Date for OT Re-Evaluation 11/09/21   ? Authorization Type UHC   ? Authorization Time Period VL: 60 combined (hard max), No Auth Req'd   ? Authorization - Number of Visits 20   ? OT Start Time [id number]   ? OT Stop Time 1015   ? OT Time Calculation (min) 38 min   ? Activity Tolerance Patient tolerated treatment well   ? Behavior During Therapy [facility] for tasks assessed/performed   ? ?  ?  ? ?  ? ? ?Past Medical History:  ?Diagnosis Date  ? Blood in stool   ? Hyperlipidemia   ? Vaginal fibroids   ? ? ?History reviewed. No pertinent surgical history. ? ?There were no vitals filed for this visit. ? ? Subjective Assessment - 09/12/21 0938   ? ? Subjective  "Trying to make it - everything seems to be getting better"   ? Pertinent History hyperlipidemia,  prediabetes and tobacco use.   ? Limitations Fall Risk   ? Currently in Pain? No/denies   ? Pain Score 0-No pain   ? ?  ?  ? ?  ? ? ? ? ? ? ? ? ? ? ? ? ? ? ? OT Treatments/Exercises (OP) - 09/12/21 8280   ? ?  ? ADLs  ? Overall ADLs pt has met all STGs   ? ADL Comments reports doing coordination and putty exercises   ?  ? Exercises  ? Exercises Hand   ?  ? Cognitive Exercises  ? Attention Span Alternating Constant Therapy level 6 on alternating symbols with 94% accuracy - some minimal inattention but overall did well. Pt enjoyed activity.   ?  ? Visual/Perceptual Exercises  ? Scanning Environmental   ? Scanning - Environmental 14/15 accuracy 93% with first pass- able to locate remaining card on second pass.   ?  ? Fine Motor Coordination (Hand/Wrist)   ? Fine Motor Coordination Grooved pegs   ? Grooved pegs with LUE with working on in hand manipulation with min difficulty - no drops   ? ?  ?  ? ?  ? ? ? ? ? ? ? ? ? ? ? OT Short Term Goals - 09/12/21 0941   ? ?  ? OT SHORT TERM GOAL #1  ? Title Pt will be independent with initial HEP   ? Time 4   ? Period Weeks   ? Status Achieved   independent with cooridnation and putty HEP 09/12/21  ? Target Date 09/28/21   ?  ? OT SHORT TERM GOAL #2  ? Title Pt will verbalize understanding of memory compensatory strategies   ? Time 4   ? Period Weeks   ? Status Achieved   using memory notebook and cell phone 09/12/21  ?  ? OT SHORT TERM GOAL #3  ? Title Pt will perform environmental scanning in moderately distracting environment with 90% accuracy or greater.   ? Time 4   ? Period Weeks   ? Status Achieved   93% accuracy 09/12/21  ?  ? OT  SHORT TERM GOAL #4  ? Title Pt will increase grip strength by 5 lbs or greater in LUE.   ? Baseline L 19.6, R 35   ? Time 4   ? Period Weeks   ? Status Achieved   38.8 lbs LUE 09/12/21  ?  ? OT SHORT TERM GOAL #5  ? Title Pt will improve 9 hole peg test by 3 seconds or more in BUE for increase in fine motor coordination and processing speed   ? Baseline R 54.37s, L 45.37s   ? Time 4   ? Period Weeks   ? Status Achieved   40.59s with LUE 09/12/21  ? ?  ?  ? ?  ? ? ? ? OT Long Term Goals - 09/12/21 0958   ? ?  ? OT LONG TERM GOAL #1  ? Title Pt will be independent with updated HEP   ? Time 10   ? Period Weeks   ? Status On-going   ? Target Date 11/09/21   ?  ? OT LONG TERM GOAL #2  ? Title Pt will increase 9 hole peg test score with BUE by 8 seconds or greater in order to increase fine motor coordination.   ? Baseline R 54.37s, L 45.37s   ? Time 10   ? Period Weeks   ? Status On-going   ?  ? OT LONG TERM GOAL #3  ? Title Pt will be increase grip strength in LUE by 10 lbs or greater   ? Baseline L 19.6, R 35   ? Time 10   ? Period Weeks   ? Status Achieved   38.8 lbs LUE 09/12/21  ?  ? OT LONG TERM GOAL #4   ? Title Pt will complete alternating attention task with 90% accuracy or greater and with appropriate time.   ? Baseline Trail Making B > 5 minutes with 100% accuracy   ? Time 10   ? Period Weeks   ? Status On-going   ?  ? OT LONG TERM GOAL #5  ? Title Pt will increase shoulder range of motion in LUE to 130 degrees or greater for increasing ability for overhead reach.   ? Baseline 125 degreees   ? Time 10   ? Period Weeks   ? Status On-going   ?  ? OT LONG TERM GOAL #6  ? Title Complete FOTO at discharge with score of 70% or greater.   ? Baseline 62%   ? Time 10   ? Period Weeks   ? Status On-going   ? ?  ?  ? ?  ? ? ? ? ? ? ? ? Plan - 09/12/21 1054   ? ? Clinical Impression Statement Pt has met all STGs and is progressing towards other unmet goals. Pt with improvement noted today and great progress towards meeting goals.   ? OT Occupational Profile and History Problem Focused Assessment - Including review of records relating to presenting problem   ? Occupational performance deficits (Please refer to evaluation for details): ADL's;IADL's;Leisure;Work   ? Body Structure / Function / Physical Skills ADL;Decreased knowledge of precautions;Coordination;Decreased knowledge of use of DME;Strength;GMC;IADL;ROM;FMC;UE functional use;Dexterity;Flexibility;Vision   ? Cognitive Skills Attention;Problem Solve   ? Rehab Potential Good   ? Clinical Decision Making Limited treatment options, no task modification necessary   ? Comorbidities Affecting Occupational Performance: None   ? Modification or Assistance to Complete Evaluation  No modification of tasks or assist necessary to complete eval   ?  OT Frequency 2x / week   ? OT Duration Other (comment)   16 visits over 10 weeks  ? OT Treatment/Interventions Aquatic Therapy;Self-care/ADL training;Moist Heat;Fluidtherapy;DME and/or AE instruction;Therapeutic activities;Therapeutic exercise;Cognitive remediation/compensation;Visual/perceptual remediation/compensation;Passive range  of motion;Neuromuscular education;Functional Mobility Training;Energy conservation;Patient/family education   ? Plan envionemtental scanning with cog component or moderate distracting environment, alternating attention tasks   ? OT Home Exercise Plan FM Coordination issued 09/05/21   ? Consulted and Agree with Plan of Care Patient   ? ?  ?  ? ?  ? ? ?Patient will benefit from skilled therapeutic intervention in order to improve the following deficits and impairments:   ?Body Structure / Function / Physical Skills: ADL, Decreased knowledge of precautions, Coordination, Decreased knowledge of use of DME, Strength, GMC, IADL, ROM, FMC, UE functional use, Dexterity, Flexibility, Vision ?Cognitive Skills: Attention, Problem Solve ?  ? ? ?Visit Diagnosis: ?Other lack of coordination ? ?Visuospatial deficit ? ?Attention and concentration deficit ? ?Hemiplegia and hemiparesis following cerebral infarction affecting left non-dominant side ([place]) ? ?Unsteadiness on feet ? ?Muscle weakness (generalized) ? ? ? ?Problem List ?Patient Active Problem List  ? Diagnosis Date Noted  ? Acute ischemic left PCA stroke ([place]) 08/03/2021  ? Cerebellar infarction ([place]) 07/30/2021  ? Cerebral edema ([place]) 07/30/2021  ? Hyperlipidemia 07/30/2021  ? Prediabetes 07/30/2021  ? ? ?Zachery Conch, OT ?09/12/2021, 10:55 AM ? ?[facility] ?[place] ?[place], [place], [address] ?Phone: [phone]   Fax:  [phone] ? ?Name: FELECITY LEMASTER ?MRN: [id number] ?Date of Birth: October 14, 1960 ? ?

## 2021-09-12 NOTE — Therapy (Signed)
[facility], [place], [address] ?Phone: [phone]   Fax:  [phone] ? ?Speech Language Pathology Treatment ? ?Patient Details  ?Name: [name] ?MRN: [id number] ?Date of Birth: 11-26-1960 ?Referring Provider (SLP): [name]., MD ? ? ?Encounter Date: 09/12/2021 ? ? End of Session - 09/12/21 0947   ? ? Visit Number 3   ? Number of Visits 25   ? Date for SLP Re-Evaluation 11/29/21   ? SLP Start Time [phone]   ? SLP Stop Time  0930   ? SLP Time Calculation (min) 40 min   ? Activity Tolerance Patient tolerated treatment well   ? ?  ?  ? ?  ? ? ?Past Medical History:  ?Diagnosis Date  ? Blood in stool   ? Hyperlipidemia   ? Vaginal fibroids   ? ? ?History reviewed. No pertinent surgical history. ? ?There were no vitals filed for this visit. ? ? Subjective Assessment - 09/12/21 0854   ? ? Subjective "I just went to the doctor yesterday for my refills"   ? Currently in Pain? No/denies   ? ?  ?  ? ?  ? ? ? ? ? ? ? ? ADULT SLP TREATMENT - 09/12/21 0855   ? ?  ? General Information  ? Behavior/Cognition Alert;Cooperative;Pleasant mood   ?  ? Treatment Provided  ? Treatment provided Cognitive-Linquistic   ?  ? Cognitive-Linquistic Treatment  ? Treatment focused on Cognition;Patient/family/caregiver education   ? Skilled Treatment [name] has completed some cooking with success. She verbalized OT and PT HEP and is completing them consistently 1x a day, but not twice a day. She is using exercise bike as well. She has lost her phone a few times and is forgetting grocery list. We generated strategy of using magnetic grocery list with attached pencil for her mom to write down what she needs to support [name]'s memory. [name] continues to be more easily agitated - she is taking a break by playing on her phone. Instructed her to use mindfulness to support her brain - provided Breathe2Relax app for this. Attention to detail and problem solving HW  provided   ?  ? Assessment / Recommendations / Plan  ? Plan Continue with current plan of care   ?  ? Progression Toward Goals  ? Progression toward goals Progressing toward goals   ? ?  ?  ? ?  ? ? ? SLP Education - 09/12/21 0941   ? ? Education Details leave 30 min before appt, use magnetic grocery list, mindfulness, get support form siblings   ? Person(s) Educated Patient   ? Methods Explanation;Verbal cues;Handout   ? Comprehension Verbalized understanding;Verbal cues required;Need further instruction   ? ?  ?  ? ?  ? ? ? SLP Short Term Goals - 09/12/21 0946   ? ?  ? SLP SHORT TERM GOAL #1  ? Title Pt will use comensatory strategies for memory successfully between 3 sessions   ? Time 3   ? Period Weeks   ? Status On-going   ? Target Date 09/28/21   ?  ? SLP SHORT TERM GOAL #2  ? Title Pt will demonstrate adeuqate organizational skills to complete mod complex therapy tasks with modified independence (compensations) in 2 sessions   ? Time 3   ? Period Weeks   ? Status On-going   ? Target Date 09/28/21   ?  ? SLP SHORT TERM GOAL #3  ?  Title pt will demonstrate emergent awareness during a therapy task in order to make corrections in 3 sessions   ? Time 3   ? Period Weeks   ? Status On-going   ? Target Date 09/28/21   ?  ? SLP SHORT TERM GOAL #4  ? Title pt will complete cognitive testing in first 1-2 sessions   ? Time 3   ? Period --   sessions  ? Status Achieved   ? Target Date 09/14/21   ? ?  ?  ? ?  ? ? ? SLP Long Term Goals - 09/12/21 0946   ? ?  ? SLP LONG TERM GOAL #1  ? Title Pt will demonstrate adequate organizational skills to complete mod complex/complex therapy tasks with modified independence (compensations) in 4 sessions   ? Time 7   ? Period Weeks   ? Status On-going   ?  ? SLP LONG TERM GOAL #2  ? Title pt will demo anticipatory awareness by pre-planning/organizing a task prior to initiation, independently, in 3 sessions   ? Time 7   ? Period Weeks   ? Status On-going   ?  ? SLP LONG TERM GOAL #3  ?  Title Pt will demonstrate adequate organizational skills to complete mod complex/complex therapy tasks with modified independence (compensations) in 4 sessions   ? Time 11   ? Period Weeks   ? Status On-going   ?  ? SLP LONG TERM GOAL #4  ? Title pt will independently use memory strategies for work-like tasks in 5 sessions   ? Time 11   ? Period Weeks   ? Status On-going   ? ?  ?  ? ?  ? ? ? Plan - 09/12/21 0942   ? ? Clinical Impression Statement Moderate cognitive linguitic impairments persist resulting in frustration, loosing items, increased agitation, memory and attention. Ongoing training in compensatory strategies for cognition to complete household chores and IADL's. Continue skilled ST to maximize cognition for safety, independence, to care for her mother and possible return to work in some capacity   ? Speech Therapy Frequency 2x / week   ? Duration 12 weeks   ? Treatment/Interventions Compensatory techniques;Environmental controls;SLP instruction and feedback;Cueing hierarchy;Cognitive reorganization;Compensatory strategies;Patient/family education;Internal/external aids   ? Potential to Achieve Goals Good   ? ?  ?  ? ?  ? ? ?Patient will benefit from skilled therapeutic intervention in order to improve the following deficits and impairments:   ?Cognitive communication deficit ? ? ? ?Problem List ?Patient Active Problem List  ? Diagnosis Date Noted  ? 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 Cerebral edema ([place]) 07/30/2021  ? Hyperlipidemia 07/30/2021  ? Prediabetes 07/30/2021  ? ? ?[name], [name], CCC-SLP ?09/12/2021, 9:52 AM ? ?[facility] ?[place], [place], [address] ?Phone: [phone]   Fax:  [phone] ? ? ?Name: [name] ?MRN: [id number] ?Date of Birth: November 03, 1960 ? ?

## 2021-09-12 NOTE — Therapy (Signed)
?OUTPATIENT PHYSICAL THERAPY TREATMENT NOTE ? ? ?Patient Name: [name] ?MRN: [id number] ?DOB:Jan 01, 1961, 61 y.o., female ?Today's Date: 09/12/2021 ? ?PCP: Jearld Fenton, NP ?REFERRING PROVIDER: Izora Ribas, MD  ? ? PT End of Session - 09/12/21 1100   ? ? Visit Number 4   ? Number of Visits 17   ? Date for PT Re-Evaluation 11/23/21   pushed out due to scheduling  ? Authorization Type UHC (VL: 60 per discipline)   ? PT Start Time 1015   ? PT Stop Time 1055   ? PT Time Calculation (min) 40 min   ? Equipment Utilized During Treatment Gait belt   ? Activity Tolerance Patient tolerated treatment well   ? Behavior During Therapy [facility] for tasks assessed/performed   ? ?  ?  ? ?  ? ? ?Past Medical History:  ?Diagnosis Date  ? Blood in stool   ? Hyperlipidemia   ? Vaginal fibroids   ? ?No past surgical history on file. ?Patient Active Problem List  ? Diagnosis Date Noted  ? Acute ischemic left PCA stroke ([place]) 08/03/2021  ? 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 Cerebral edema ([place]) 07/30/2021  ? Hyperlipidemia 07/30/2021  ? Prediabetes 07/30/2021  ? ? ?REFERRING DIAG: I63.50 (ICD-10-CM) - Cerebral artery occlusion with cerebral infarction ([place])  ? ?THERAPY DIAG:  ?Unsteadiness on feet ? ?Muscle weakness (generalized) ? ?Other abnormalities of gait and mobility ? ?PERTINENT HISTORY: HLD, Prediabetes ? ?PRECAUTIONS: Fall ? ?SUBJECTIVE: States the exercises are going well and she likes them, feels as though she is getting better  ? ?PAIN:  ?Are you having pain? No ? ? ? ?TODAY'S TREATMENT:  ?There Ex ?-ScitFit level 3 for 10min w/bilat UE/LE for dynamic cardiovascular warmup and UE/LE coordination ?Gait training  ?-230' without AD w/CGA, noted decreased step length of LLE, decreased stance time on RLE, rigid sagittal plane rotation and decreased cadence. Min verbal cues for arm swing and min tactile cues for lateral weight shifting.  ?-115' at end of session without AD and S*, noted improved arm swing and increased  confidence with balance resulting in improved cadence and step length bilaterally.  ?NMR  ?-Staggered stance squats w/single leg propped on Air ex, x10 per side w/S* for improved single leg stability, BLE strength and balance  ?-Progressed to alt. Forward lunges from air ex, x5 per side  ?-Added contralateral ball slam w/fwd lunge for improved trunk rotation, single leg stability and dynamic balance, x10 per side  ?-progressed to adding 4 colored dots to floor and having pt slam ball to random color for added balance and dual-tasking challenge, x10 per side  ? ? ? ?PATIENT EDUCATION: ?Education details: Continue HEP, safety with walking program outdoors in park ?Person educated: Patient ?Education method: Explanation and Demonstration ?Education comprehension: verbalized understanding ? ? ?HOME EXERCISE PROGRAM: ?Access Code: [email or web address] ?URL: [email or web address] ?Date: 09/10/2021 ?Prepared by: Janann August ?  ?Exercises ?Heel to toe walking - 1 x daily - 7 x weekly - 3 sets - 3 reps ?Backward heel to toe walking - 1 x daily - 7 x weekly - 3 sets - 3 reps ?Mini Squats with Walker and Chair - 1 x daily - 7 x weekly - 2 sets - 10 reps ?  ?Updates to HEP on 09/10/21:  ?Standing Marching - 1-2 x daily - 5 x weekly - 3 sets - down and back at countertop, cued for wider BOS and slowed pace for SLS  ?Romberg Stance with Eyes Closed -  1-2 x daily - 5 x weekly - 3 sets - 30 hold - on level ground.  ? ? PT Short Term Goals - 09/03/21 1148   ? ?  ? PT SHORT TERM GOAL #1  ? Title Pt will be indepdent with initial HEP for balance/strength   ? Baseline no HEP established   ? Time 4   ? Period Weeks   ? Status New   ? Target Date 09/28/21   ?  ? PT SHORT TERM GOAL #2  ? Title Pt will improve TUG to </= 16 seconds with LRAD to demonstrate reduced fall risk   ? Baseline 22.72 secs with RW   ? Time 4   ? Period Weeks   ? Status New   ?  ? PT SHORT TERM GOAL #3  ? Title Patient will improve gait speed to >/= 2.0 ft/sec  w/ LRAD to demo improved community mobility   ? Baseline 1.90 ft/sec   ? Time 4   ? Period Weeks   ? Status New   ?  ? PT SHORT TERM GOAL #4  ? Title Pt will improve Berg score to >42/56 for reduced risk of falls   ? Baseline 35/56   ? Time 4   ? Period Weeks   ? Status Revised   ?  ? PT SHORT TERM GOAL #5  ? Title Pt will be able to ambulate >/= 300 ft w/ LRAD and supervision to demo improved household/community mobility   ? Baseline 120' CGA   ? Time 4   ? Period Weeks   ? Status New   ? ?  ?  ? ?  ? ? ? PT Long Term Goals - 09/03/21 1149   ? ?  ? PT LONG TERM GOAL #1  ? Title Pt will be independent with final HEP for balance/strength   ? Baseline no HEP established   ? Time 8   ? Period Weeks   ? Status New   ? Target Date 10/26/21   ?  ? PT LONG TERM GOAL #2  ? Title Pt will perform >48/56 on Berg for improved dynamic balance and reduced fall risk   ? Baseline 35/56   ? Time 8   ? Period Weeks   ? Status Revised   ?  ? PT LONG TERM GOAL #3  ? Title Pt will improve TUG to </= 12 seconds w/ LRAD to demo improved balance   ? Baseline 22.72 secs w/ RW   ? Time 8   ? Period Weeks   ? Status New   ?  ? PT LONG TERM GOAL #4  ? Title Pt will improve 5x sit <> stand to </= 12 seconds to demo improved balance   ? Baseline 14.22 secs   ? Time 8   ? Period Weeks   ? Status New   ?  ? PT LONG TERM GOAL #5  ? Title Pt will improve gait speed to >/= 2.6 ft/sec to demo improved community ambulator   ? Baseline 1.90 ft/sec   ? Time 8   ? Period Weeks   ? Status New   ?  ? PT LONG TERM GOAL #6  ? Title Pt will be able to ascend/descend 8 stairs with reciprocal pattern and supervision with use of single rail vs. no rail   ? Baseline step to pattern bil rails   ? Time 8   ? Period Weeks   ? Status New   ?  ?  PT LONG TERM GOAL #7  ? Title Pt will improve FOTO to >/= 62%   ? Baseline 49%   ? Time 8   ? Period Weeks   ? Status New   ? ?  ?  ? ?  ? ? ? Plan - 09/12/21 1101   ? ? Clinical Impression Statement Emphasis of skilled session  on gait training without AD, single leg stability, BLE coordination and strengthening. Pt continues to demonstrate fear-avoidance behavior with gait due to fear of falling but was able to maintain midline orientation throughout session without verbal, visual or tactile cues. Pt doing well with high-level balance tasks but requires min A for steadying assist due to R sided weakness. Pt is progressing towards LTGs, continue POC.   ? Personal Factors and Comorbidities Comorbidity 2;Transportation   ? Comorbidities HLD, Prediabetes   ? Examination-Activity Limitations Transfers;Stairs;Stand;Locomotion Level;Squat;Bend   ? Examination-Participation Restrictions Occupation;Cleaning;Community Activity;Driving   ? Stability/Clinical Decision Making Stable/Uncomplicated   ? Rehab Potential Good   ? PT Frequency 2x / week   ? PT Duration 8 weeks   plus eval  ? PT Treatment/Interventions ADLs/Self Care Home Management;Aquatic Therapy;Canalith Repostioning;Cryotherapy;Electrical Stimulation;Moist Heat;DME Instruction;Gait training;Stair training;Functional mobility training;Therapeutic activities;Therapeutic exercise;Balance training;Neuromuscular re-education;Patient/family education;Orthotic Fit/Training;Manual techniques;Passive range of motion;Vestibular;Energy conservation   ? PT Next Visit Plan Walking program? Update HEP as needed, high-level balance, single leg stance, BLE strength   ? PT Home Exercise Plan [email or web address]   ? Consulted and Agree with Plan of Care Patient   ? ?  ?  ? ?  ? ? ? ?Cruzita Lederer Suhey Radford, PT, DPT ?09/12/2021, 11:06 AM ? ?  ? ?

## 2021-09-17 ENCOUNTER — Ambulatory Visit: Payer: 59

## 2021-09-17 ENCOUNTER — Ambulatory Visit: Payer: 59 | Admitting: Occupational Therapy

## 2021-09-17 ENCOUNTER — Ambulatory Visit: Payer: 59 | Admitting: Speech Pathology

## 2021-09-17 ENCOUNTER — Encounter: Payer: Self-pay | Admitting: Occupational Therapy

## 2021-09-17 ENCOUNTER — Other Ambulatory Visit: Payer: Self-pay

## 2021-09-17 ENCOUNTER — Encounter: Payer: Self-pay | Admitting: Speech Pathology

## 2021-09-17 DIAGNOSIS — R278 Other lack of coordination: Secondary | ICD-10-CM

## 2021-09-17 DIAGNOSIS — R41840 Attention and concentration deficit: Secondary | ICD-10-CM

## 2021-09-17 DIAGNOSIS — I69354 Hemiplegia and hemiparesis following cerebral infarction affecting left non-dominant side: Secondary | ICD-10-CM

## 2021-09-17 DIAGNOSIS — R262 Difficulty in walking, not elsewhere classified: Secondary | ICD-10-CM

## 2021-09-17 DIAGNOSIS — R2689 Other abnormalities of gait and mobility: Secondary | ICD-10-CM

## 2021-09-17 DIAGNOSIS — R2681 Unsteadiness on feet: Secondary | ICD-10-CM

## 2021-09-17 DIAGNOSIS — M6281 Muscle weakness (generalized): Secondary | ICD-10-CM

## 2021-09-17 DIAGNOSIS — R41842 Visuospatial deficit: Secondary | ICD-10-CM

## 2021-09-17 DIAGNOSIS — R41841 Cognitive communication deficit: Secondary | ICD-10-CM

## 2021-09-17 NOTE — Patient Instructions (Addendum)
? ? ?  You are right, clutter is very hard on your brain while you are healing - stay organized as best you can ? 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 ?When you put something down, try to be mindful - say to yourself "I am putting my purse on the sofa" ? ?Stop and pay attention to what you are doing  ? 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 ?Note that you do get distracted and off task when you are interrupted or talking. When you return to work, you may need to have set times to check emails, make phone calls, respond to texts so  you don't get off task or make a mistake ? ? ?

## 2021-09-17 NOTE — Therapy (Signed)
?OUTPATIENT PHYSICAL THERAPY TREATMENT NOTE ? ? ?Patient Name: [name] ?MRN: [id number] ?DOB:07/27/1960, 61 y.o., female ?Today's Date: 09/17/2021 ? ?PCP: Jearld Fenton, NP ?REFERRING PROVIDER: Izora Ribas, MD  ? ? PT End of Session - 09/17/21 0719   ? ? Visit Number 5   ? Number of Visits 17   ? Date for PT Re-Evaluation 11/23/21   pushed out due to scheduling  ? Authorization Type UHC (VL: 60 per discipline)   ? PT Start Time 0719   pt arriving late  ? PT Stop Time 0759   ? PT Time Calculation (min) 40 min   ? Equipment Utilized During Treatment Gait belt   ? Activity Tolerance Patient tolerated treatment well   ? Behavior During Therapy [facility] for tasks assessed/performed   ? ?  ?  ? ?  ? ? ?Past Medical History:  ?Diagnosis Date  ? Blood in stool   ? Hyperlipidemia   ? Vaginal fibroids   ? ?No past surgical history on file. ?Patient Active Problem List  ? Diagnosis Date Noted  ? Acute ischemic left PCA stroke ([place]) 08/03/2021  ? 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 Cerebral edema ([place]) 07/30/2021  ? Hyperlipidemia 07/30/2021  ? Prediabetes 07/30/2021  ? ? ?REFERRING DIAG: I63.50 (ICD-10-CM) - Cerebral artery occlusion with cerebral infarction ([place])  ? ?THERAPY DIAG:  ?Unsteadiness on feet ? ?Muscle weakness (generalized) ? ?Other abnormalities of gait and mobility ? ?Difficulty in walking, not elsewhere classified ? ?PERTINENT HISTORY: HLD, Prediabetes ? ?PRECAUTIONS: Fall ? ?SUBJECTIVE: Had a good weekend. Went to the park and walked. Reports had some difficulty with the stairs, but overall went very well.  ? ?PAIN:  ?Are you having pain? No ? ? ? ?TODAY'S TREATMENT:  ? ?Ther Ex: ?Completed SciFit on Level 3 for 7 mins w/ BUE/BLE for dynamic cardiovascular warmup and UE/LE coordination. Cues for large movements and maintain steps/per minute >/= 80. ? ?Gait training: ? ?RAMP:  ?Level of Assistance: SBA ?Assistive device utilized: None ?Ramp Comments: ambulation up/down ramp withCGA level.  ? ?CURB:   ?Level of Assistance: SBA and CGA ?Assistive device utilized: None ?Curb Comments: initially complted ascend/descend indoor curb with HHA x 3 reps, cues for sequencing. Patient dmeo improvements therefore transitioning to no UE support, mild unsteadiness with descent down curb, completed addition 3 reps.  ? ?STAIRS: ? Level of Assistance: CGA ? Stair Negotiation Technique: Step to Pattern ?Alternating Pattern  ?Forwards with Single Rail on Right ?Bilateral Rails ? Number of Stairs: 20  ? Height of Stairs: 6  ?Comments: Pt initially completing step to pattern with stairs with bil rails. PT providing cues for proper sequencing. Due to single rail at home progressed to single rail vs bil rails, and begin to work on reciprocal pattern. Increased challenge with reciprocal pattern with reduced UE support.   ? ?GAIT: ?Gait pattern: step through pattern, decreased step length- Right, decreased step length- Left, and decreased stance time- Right ?Distance walked: 345 ?Assistive device utilized: Environmental consultant - 2 wheeled and None ?Level of assistance: CGA ?Comments: Completed ambulation x 345 without AD working on improved step length and reduced UE support. Mild unsteadiness noted toward end of ambulation due to fatigue. BP after ambulation: 118/67, HR: 62 ? ?NMR  ?Completed sit <> stand training from mat at standard chair height without UE support, cues for forward lean completed x 10 reps. Cues for control with descent.  ? ? ? ? ?PATIENT EDUCATION: ?Education details: Continue HEP, plan  to gauge distance/time ambulated with walking program outdoors in park ?Person educated: Patient ?Education method: Explanation and Demonstration ?Education comprehension: verbalized understanding ? ? ?HOME EXERCISE PROGRAM: ?Access Code: [email or web address] ? ? PT Short Term Goals  ? ?  ? PT SHORT TERM GOAL #1  ? Title Pt will be indepdent with initial HEP for balance/strength   ? Baseline no HEP established   ? Time 4   ? Period Weeks   ? Status New   ?  Target Date 09/28/21   ?  ? PT SHORT TERM GOAL #2  ? Title Pt will improve TUG to </= 16 seconds with LRAD to demonstrate reduced fall risk   ? Baseline 22.72 secs with RW   ? Time 4   ? Period Weeks   ? Status New   ?  ? PT SHORT TERM GOAL #3  ? Title Patient will improve gait speed to >/= 2.0 ft/sec w/ LRAD to demo improved community mobility   ? Baseline 1.90 ft/sec   ? Time 4   ? Period Weeks   ? Status New   ?  ? PT SHORT TERM GOAL #4  ? Title Pt will improve Berg score to >42/56 for reduced risk of falls   ? Baseline 35/56   ? Time 4   ? Period Weeks   ? Status Revised   ?  ? PT SHORT TERM GOAL #5  ? Title Pt will be able to ambulate >/= 300 ft w/ LRAD and supervision to demo improved household/community mobility   ? Baseline 120' CGA   ? Time 4   ? Period Weeks   ? Status New   ? ?  ?  ? ?  ? ? ? PT Long Term Goals  ? ?  ? PT LONG TERM GOAL #1  ? Title Pt will be independent with final HEP for balance/strength   ? Baseline no HEP established   ? Time 8   ? Period Weeks   ? Status New   ? Target Date 10/26/21   ?  ? PT LONG TERM GOAL #2  ? Title Pt will perform >48/56 on Berg for improved dynamic balance and reduced fall risk   ? Baseline 35/56   ? Time 8   ? Period Weeks   ? Status Revised   ?  ? PT LONG TERM GOAL #3  ? Title Pt will improve TUG to </= 12 seconds w/ LRAD to demo improved balance   ? Baseline 22.72 secs w/ RW   ? Time 8   ? Period Weeks   ? Status New   ?  ? PT LONG TERM GOAL #4  ? Title Pt will improve 5x sit <> stand to </= 12 seconds to demo improved balance   ? Baseline 14.22 secs   ? Time 8   ? Period Weeks   ? Status New   ?  ? PT LONG TERM GOAL #5  ? Title Pt will improve gait speed to >/= 2.6 ft/sec to demo improved community ambulator   ? Baseline 1.90 ft/sec   ? Time 8   ? Period Weeks   ? Status New   ?  ? PT LONG TERM GOAL #6  ? Title Pt will be able to ascend/descend 8 stairs with reciprocal pattern and supervision with use of single rail vs. no rail   ? Baseline step to pattern  bil rails   ? Time 8   ? Period Weeks   ?  Status New   ?  ? PT LONG TERM GOAL #7  ? Title Pt will improve FOTO to >/= 62%   ? Baseline 49%   ? Time 8   ? Period Weeks   ? Status New   ? ?  ?  ? ?  ? ? ? Plan  ? ? Clinical Impression Statement Continued skilled PT session focused on reduced AD use with ambulation and curb/ramp negotiation. With curb and stair negotiation, increased challenge noted with reduced UE support. Require CGA due to unsteadiness and intermittent cues for seqnuencing to promote improved stability. Pt tolerating progression well. Will continue per POC.   ? Personal Factors and Comorbidities Comorbidity 2;Transportation   ? Comorbidities HLD, Prediabetes   ? Examination-Activity Limitations Transfers;Stairs;Stand;Locomotion Level;Squat;Bend   ? Examination-Participation Restrictions Occupation;Cleaning;Community Activity;Driving   ? Stability/Clinical Decision Making Stable/Uncomplicated   ? Rehab Potential Good   ? PT Frequency 2x / week   ? PT Duration 8 weeks   plus eval  ? PT Treatment/Interventions ADLs/Self Care Home Management;Aquatic Therapy;Canalith Repostioning;Cryotherapy;Electrical Stimulation;Moist Heat;DME Instruction;Gait training;Stair training;Functional mobility training;Therapeutic activities;Therapeutic exercise;Balance training;Neuromuscular re-education;Patient/family education;Orthotic Fit/Training;Manual techniques;Passive range of motion;Vestibular;Energy conservation   ? PT Next Visit Plan Continue gait iwhtout AD, stairs and curb negotiation. Update HEP as needed, high-level balance, single leg stance, BLE strength   ? PT Home Exercise Plan [email or web address]   ? Consulted and Agree with Plan of Care Patient   ? ?  ?  ? ?  ? ? ? ? ?Jones Bales, PT, DPT ?09/17/2021, 8:11 AM ? ?  ? ?

## 2021-09-17 NOTE — Therapy (Signed)
[facility] ?[place] ?[place], [place], [address] ?Phone: [phone]   Fax:  [phone] ? ?Occupational Therapy Treatment ? ?Patient Details  ?Name: [name] ?MRN: [id number] ?Date of Birth: [birth year] ?Referring Provider (OT): Jinger Neighbors, MD ? ? ?Encounter Date: 09/17/2021 ? ? OT End of Session - 09/17/21 0804   ? ? Visit Number 5   ? Number of Visits 17   ? Date for OT Re-Evaluation 11/09/21   ? Authorization Type [employer]   ? Authorization Time Period VL: 60 combined (hard max), No Auth Req'd   ? Authorization - Number of Visits 20   ? OT Start Time [phone]   ? OT Stop Time [phone]   ? OT Time Calculation (min) 40 min   ? Activity Tolerance Patient tolerated treatment well   ? Behavior During Therapy [facility] for tasks assessed/performed   ? ?  ?  ? ?  ? ? ?Past Medical History:  ?Diagnosis Date  ? Blood in stool   ? Hyperlipidemia   ? Vaginal fibroids   ? ? ?History reviewed. No pertinent surgical history. ? ?There were no vitals filed for this visit. ? ? Subjective Assessment - 09/17/21 0804   ? ? Subjective  "Trying to do my best"   ? Pertinent History hyperlipidemia,  prediabetes and tobacco use.   ? Limitations Fall Risk   ? Currently in Pain? No/denies   ? ?  ?  ? ?  ? ? ? ?Completing 24-piece puzzle with min cueing for problem-solving/strategy initially and incr time. ? ?Environmental scanning in mod distracting environment with 13/15 items found on first pass (87%) and remaining 2 items found on 2nd pass.  Pt demo difficulty keeping track of number of items found and needed min cueing (for cognitive component/alternating attention) ? ?Placing small pegs in pegboards to copy 2 designs (alternating attention) with incr time and good accuracy for what she completed, but incr time (did not complete due to time constraints).  ? ? ? ? ? ? ? OT Short Term Goals - 09/12/21 0941   ? ?  ? OT SHORT TERM GOAL #1  ? Title Pt will be independent with initial  HEP   ? Time 4   ? Period Weeks   ? Status Achieved   independent with cooridnation and putty HEP 09/12/21  ? Target Date 09/28/21   ?  ? OT SHORT TERM GOAL #2  ? Title Pt will verbalize understanding of memory compensatory strategies   ? Time 4   ? Period Weeks   ? Status Achieved   using memory notebook and cell phone 09/12/21  ?  ? OT SHORT TERM GOAL #3  ? Title Pt will perform environmental scanning in moderately distracting environment with 90% accuracy or greater.   ? Time 4   ? Period Weeks   ? Status Achieved   93% accuracy 09/12/21  ?  ? OT SHORT TERM GOAL #4  ? Title Pt will increase grip strength by 5 lbs or greater in LUE.   ? Baseline L 19.6, R 35   ? Time 4   ? Period Weeks   ? Status Achieved   38.8 lbs LUE 09/12/21  ?  ? OT SHORT TERM GOAL #5  ? Title Pt will improve 9 hole peg test by 3 seconds or more in BUE for increase in fine motor coordination and processing speed   ? Baseline R 54.37s, L 45.37s   ?  Time 4   ? Period Weeks   ? Status Achieved   40.59s with LUE 09/12/21  ? ?  ?  ? ?  ? ? ? ? OT Long Term Goals - 09/12/21 0958   ? ?  ? OT LONG TERM GOAL #1  ? Title Pt will be independent with updated HEP   ? Time 10   ? Period Weeks   ? Status On-going   ? Target Date 11/09/21   ?  ? OT LONG TERM GOAL #2  ? Title Pt will increase 9 hole peg test score with BUE by 8 seconds or greater in order to increase fine motor coordination.   ? Baseline R 54.37s, L 45.37s   ? Time 10   ? Period Weeks   ? Status On-going   ?  ? OT LONG TERM GOAL #3  ? Title Pt will be increase grip strength in LUE by 10 lbs or greater   ? Baseline L 19.6, R 35   ? Time 10   ? Period Weeks   ? Status Achieved   38.8 lbs LUE 09/12/21  ?  ? OT LONG TERM GOAL #4  ? Title Pt will complete alternating attention task with 90% accuracy or greater and with appropriate time.   ? Baseline Trail Making B > 5 minutes with 100% accuracy   ? Time 10   ? Period Weeks   ? Status On-going   ?  ? OT LONG TERM GOAL #5  ? Title Pt will increase shoulder  range of motion in LUE to 130 degrees or greater for increasing ability for overhead reach.   ? Baseline 125 degreees   ? Time 10   ? Period Weeks   ? Status On-going   ?  ? OT LONG TERM GOAL #6  ? Title Complete FOTO at discharge with score of 70% or greater.   ? Baseline 62%   ? Time 10   ? Period Weeks   ? Status On-going   ? ?  ?  ? ?  ? ? ? ? ? ? ? ? Plan - 09/17/21 0932   ? ? Clinical Impression Statement Pt is progressing towards goals with improving alternating attention.   ? OT Occupational Profile and History Problem Focused Assessment - Including review of records relating to presenting problem   ? Occupational performance deficits (Please refer to evaluation for details): ADL's;IADL's;Leisure;Work   ? Body Structure / Function / Physical Skills ADL;Decreased knowledge of precautions;Coordination;Decreased knowledge of use of DME;Strength;GMC;IADL;ROM;FMC;UE functional use;Dexterity;Flexibility;Vision   ? Cognitive Skills Attention;Problem Solve   ? Rehab Potential Good   ? Clinical Decision Making Limited treatment options, no task modification necessary   ? Comorbidities Affecting Occupational Performance: None   ? Modification or Assistance to Complete Evaluation  No modification of tasks or assist necessary to complete eval   ? OT Frequency 2x / week   ? OT Duration Other (comment)   16 visits over 10 weeks  ? OT Treatment/Interventions Aquatic Therapy;Self-care/ADL training;Moist Heat;Fluidtherapy;DME and/or AE instruction;Therapeutic activities;Therapeutic exercise;Cognitive remediation/compensation;Visual/perceptual remediation/compensation;Passive range of motion;Neuromuscular education;Functional Mobility Training;Energy conservation;Patient/family education   ? Plan envionemtental scanning with cognitive component, alternating attention tasks, cognitive re-training   ? OT Home Exercise Plan FM Coordination issued 09/05/21   ? Consulted and Agree with Plan of Care Patient   ? ?  ?  ? ?   ? ? ?Patient will benefit from skilled therapeutic intervention in order to improve the following deficits  and impairments:   ?Body Structure / Function / Physical Skills: ADL, Decreased knowledge of precautions, Coordination, Decreased knowledge of use of DME, Strength, GMC, IADL, ROM, FMC, UE functional use, Dexterity, Flexibility, Vision ?Cognitive Skills: Attention, Problem Solve ?  ? ? ?Visit Diagnosis: ?Attention and concentration deficit ? ?Other lack of coordination ? ?Visuospatial deficit ? ?Hemiplegia and hemiparesis following cerebral infarction affecting left non-dominant side ([place]) ? ?Unsteadiness on feet ? ?Muscle weakness (generalized) ? ? ? ?Problem List ?Patient Active Problem List  ? Diagnosis Date Noted  ? Acute ischemic left PCA stroke ([place]) 08/03/2021  ? Cerebellar infarction ([place]) 07/30/2021  ? Cerebral edema ([place]) 07/30/2021  ? Hyperlipidemia 07/30/2021  ? Prediabetes 07/30/2021  ? ? Vianne Bulls, [place] ?09/17/2021, 10:12 AM ? ?[place] ?[place] ?[place], [place], [address] ?Phone: [phone]   Fax:  [phone] ? ?Name: TAMBER BURTCH ?MRN: [id number] ?Date of Birth: Jan 13, 1961 ? ? ?Vianne Bulls, OTR/L ?[place] ?[place], [place]  [address] ?[phone] phone ?[phone] ?09/17/21 10:12 AM ? ? ?

## 2021-09-17 NOTE — Therapy (Signed)
[facility] [facility] ?[place] ?[place], [place], [address] ?Phone: [phone]   Fax:  [phone] ? ?Speech Language Pathology Treatment ? ?Patient Details  ?Name: [name] ?MRN: [id number] ?Date of Birth: [birth year] ?Referring Provider (SLP): Kingsley Callander., MD ? ? ?Encounter Date: 09/17/2021 ? ? End of Session - 09/17/21 0929   ? ? Visit Number 4   ? Number of Visits 25   ? Date for SLP Re-Evaluation 11/29/21   ? SLP Start Time 0845   ? SLP Stop Time  [phone]   ? SLP Time Calculation (min) 42 min   ? Activity Tolerance Patient tolerated treatment well   ? ?  ?  ? ?  ? ? ?Past Medical History:  ?Diagnosis Date  ? Blood in stool   ? Hyperlipidemia   ? Vaginal fibroids   ? ? ?History reviewed. No pertinent surgical history. ? ?There were no vitals filed for this visit. ? ? Subjective Assessment - 09/17/21 0848   ? ? Subjective "She said I did good" re: OT"   ? Currently in Pain? No/denies   ? ?  ?  ? ?  ? ? ? ? ? ? ? ? ADULT SLP TREATMENT - 09/17/21 0848   ? ?  ? General Information  ? Behavior/Cognition Alert;Cooperative;Pleasant mood   ?  ? Treatment Provided  ? Treatment provided Cognitive-Linquistic   ?  ? Cognitive-Linquistic Treatment  ? Treatment focused on Cognition;Patient/family/caregiver education   ? Skilled Treatment Alexes impleted use of grocery list on the fridge to recall what she needs. Chrislyn is self advocating with family when she can't visit for long periods. Targeted alternating attention, organization and attention to detail charting employees hours and total hourse worked at [employer] with extended time, usual mod A for organization. She required exteded time and usual verbal cues to ID errors. She required cues to alternate attention between conversation and addition with calculator task.   ?  ? Assessment / Recommendations / Plan  ? Plan Continue with current plan of care   ?  ? Progression Toward Goals  ? Progression toward goals  Progressing toward goals   ? ?  ?  ? ?  ? ? ? SLP Education - 09/17/21 0927   ? ? Education Details use timer for 30 minutes and work on your closet   ? Person(s) Educated Patient   ? Methods Explanation;Verbal cues;Handout   ? Comprehension Verbal cues required;Verbalized understanding   ? ?  ?  ? ?  ? ? ? SLP Short Term Goals - 09/17/21 0928   ? ?  ? SLP SHORT TERM GOAL #1  ? Title Pt will use comensatory strategies for memory successfully between 3 sessions   ? Time 2   ? Period Weeks   ? Status On-going   ? Target Date 09/28/21   ?  ? SLP SHORT TERM GOAL #2  ? Title Pt will demonstrate adeuqate organizational skills to complete mod complex therapy tasks with modified independence (compensations) in 2 sessions   ? Time 2   ? Period Weeks   ? Status On-going   ? Target Date 09/28/21   ?  ? SLP SHORT TERM GOAL #3  ? Title pt will demonstrate emergent awareness during a therapy task in order to make corrections in 3 sessions   ? Time 2   ? Period Weeks   ? Status On-going   ? Target Date 09/28/21   ?  ?  SLP SHORT TERM GOAL #4  ? Title pt will complete cognitive testing in first 1-2 sessions   ? Time 2   ? Period --   sessions  ? Status Achieved   ? Target Date 09/14/21   ? ?  ?  ? ?  ? ? ? SLP Long Term Goals - 09/17/21 0928   ? ?  ? SLP LONG TERM GOAL #1  ? Title Pt will demonstrate adequate organizational skills to complete mod complex/complex therapy tasks with modified independence (compensations) in 4 sessions   ? Time 6   ? Period Weeks   ? Status On-going   ?  ? SLP LONG TERM GOAL #2  ? Title pt will demo anticipatory awareness by pre-planning/organizing a task prior to initiation, independently, in 3 sessions   ? Time 6   ? Period Weeks   ? Status On-going   ?  ? SLP LONG TERM GOAL #3  ? Title Pt will demonstrate adequate organizational skills to complete mod complex/complex therapy tasks with modified independence (compensations) in 4 sessions   ? Time 10   ? Period Weeks   ? Status On-going   ?  ? SLP LONG  TERM GOAL #4  ? Title pt will independently use memory strategies for work-like tasks in 5 sessions   ? Time 10   ? Period Weeks   ? Status On-going   ? ?  ?  ? ?  ? ? ? Plan - 09/17/21 0927   ? ? Clinical Impression Statement Moderate cognitive linguitic impairments persist resulting in frustration, loosing items, increased agitation, memory and attention. Ongoing training in compensatory strategies for cognition to complete household chores and IADL's. Continue skilled ST to maximize cognition for safety, independence, to care for her mother and possible return to work in some capacity   ? Speech Therapy Frequency 2x / week   ? Duration 12 weeks   ? Treatment/Interventions Compensatory techniques;Environmental controls;SLP instruction and feedback;Cueing hierarchy;Cognitive reorganization;Compensatory strategies;Patient/family education;Internal/external aids   ? Potential to Achieve Goals Good   ? ?  ?  ? ?  ? ? ?Patient will benefit from skilled therapeutic intervention in order to improve the following deficits and impairments:   ?Cognitive communication deficit ? ? ? ?Problem List ?Patient Active Problem List  ? Diagnosis Date Noted  ? Acute ischemic left PCA stroke ([place]) 08/03/2021  ? Cerebellar infarction ([place]) 07/30/2021  ? Cerebral edema ([place]) 07/30/2021  ? Hyperlipidemia 07/30/2021  ? Prediabetes 07/30/2021  ? ? ?Iasha Mccalister, Annye Rusk, CCC-SLP ?09/17/2021, 9:30 AM ? ?[facility] [place] ?[place] ?[place], [place], [address] ?Phone: [phone]   Fax:  [phone] ? ? ?Name: CYTHIA BACHTEL ?MRN: [id number] ?Date of Birth: 1960-08-01 ? ?

## 2021-09-19 ENCOUNTER — Ambulatory Visit: Payer: 59 | Admitting: Occupational Therapy

## 2021-09-19 ENCOUNTER — Ambulatory Visit: Payer: 59 | Admitting: Speech Pathology

## 2021-09-19 ENCOUNTER — Encounter: Payer: Self-pay | Admitting: Occupational Therapy

## 2021-09-19 ENCOUNTER — Other Ambulatory Visit: Payer: Self-pay

## 2021-09-19 ENCOUNTER — Encounter: Payer: Self-pay | Admitting: Speech Pathology

## 2021-09-19 ENCOUNTER — Ambulatory Visit: Payer: 59 | Admitting: Physical Therapy

## 2021-09-19 DIAGNOSIS — R41841 Cognitive communication deficit: Secondary | ICD-10-CM

## 2021-09-19 DIAGNOSIS — M6281 Muscle weakness (generalized): Secondary | ICD-10-CM

## 2021-09-19 DIAGNOSIS — R2681 Unsteadiness on feet: Secondary | ICD-10-CM

## 2021-09-19 DIAGNOSIS — R278 Other lack of coordination: Secondary | ICD-10-CM

## 2021-09-19 DIAGNOSIS — R2689 Other abnormalities of gait and mobility: Secondary | ICD-10-CM

## 2021-09-19 DIAGNOSIS — R41842 Visuospatial deficit: Secondary | ICD-10-CM

## 2021-09-19 DIAGNOSIS — I69354 Hemiplegia and hemiparesis following cerebral infarction affecting left non-dominant side: Secondary | ICD-10-CM

## 2021-09-19 DIAGNOSIS — R41840 Attention and concentration deficit: Secondary | ICD-10-CM

## 2021-09-19 NOTE — Therapy (Signed)
[facility] ?[place] ?[place], [place], [address] ?Phone: [phone]   Fax:  [phone] ? ?Speech Language Pathology Treatment ? ?Patient Details  ?Name: [name] ?MRN: [id number] ?Date of Birth: Nov 27, 1960 ?Referring Provider (SLP): Kingsley Callander., MD ? ? ?Encounter Date: 09/19/2021 ? ? End of Session - 09/19/21 1126   ? ? Visit Number 5   ? Number of Visits 25   ? Date for SLP Re-Evaluation 11/29/21   ? SLP Start Time 0930   ? SLP Stop Time  1013   ? SLP Time Calculation (min) 43 min   ? Activity Tolerance Patient tolerated treatment well   ? ?  ?  ? ?  ? ? ?Past Medical History:  ?Diagnosis Date  ? Blood in stool   ? Hyperlipidemia   ? Vaginal fibroids   ? ? ?History reviewed. No pertinent surgical history. ? ?There were no vitals filed for this visit. ? ? Subjective Assessment - 09/19/21 0938   ? ? Subjective "I got to the park and walked"   ? Currently in Pain? No/denies   ? ?  ?  ? ?  ? ? ? ? ? ? ? ? ADULT SLP TREATMENT - 09/19/21 0940   ? ?  ? General Information  ? Behavior/Cognition Alert;Cooperative;Pleasant mood   ?  ? Treatment Provided  ? Treatment provided Cognitive-Linquistic   ?  ? Cognitive-Linquistic Treatment  ? Treatment focused on Cognition;Patient/family/caregiver education   ? Skilled Treatment [name] did not complete HW - she focused on exercise and getting out of the house with success. She planned outing with BF independently. Targeted organization of information, attention to detail, error awrareness and alternating attention in data organization task - [name] initially required mod A to set up organiziation task. Occasional min A to ID errors and altenrate attention from calculator to chart. She does use a calculator at home - She id'd her error today that she was not using decimals. She did endorse this would be an issue at her work. Targeted accomodations, energy conservation, and self advocacy strategies she may need upon  return to work. [name] hopes to return to work in 1 month - I am not sure this is reasonable as she stands all day and currently is using a walker.   ?  ? Assessment / Recommendations / Plan  ? Plan Continue with current plan of care   ?  ? Progression Toward Goals  ? Progression toward goals Progressing toward goals   ? ?  ?  ? ?  ? ? ? SLP Education - 09/19/21 1122   ? ? Education Details compensatory strategies and accomodations for return to work,  energy conservation, Lexicographer   ? Person(s) Educated Patient   ? Methods Explanation;Verbal cues;Handout   ? Comprehension Verbalized understanding;Verbal cues required;Need further instruction   ? ?  ?  ? ?  ? ? ? SLP Short Term Goals - 09/19/21 1126   ? ?  ? SLP SHORT TERM GOAL #1  ? Title Pt will use comensatory strategies for memory successfully between 3 sessions   ? Baseline 09/19/21   ? Time 2   ? Period Weeks   ? Status On-going   ? Target Date 09/28/21   ?  ? SLP SHORT TERM GOAL #2  ? Title Pt will demonstrate adeuqate organizational skills to complete mod complex therapy tasks with modified independence (compensations) in 2 sessions   ? Time 2   ?  Period Weeks   ? Status On-going   ? Target Date 09/28/21   ?  ? SLP SHORT TERM GOAL #3  ? Title pt will demonstrate emergent awareness during a therapy task in order to make corrections in 3 sessions   ? Time 2   ? Period Weeks   ? Status On-going   ? Target Date 09/28/21   ?  ? SLP SHORT TERM GOAL #4  ? Title pt will complete cognitive testing in first 1-2 sessions   ? Time 2   ? Period --   sessions  ? Status Achieved   ? Target Date 09/14/21   ? ?  ?  ? ?  ? ? ? SLP Long Term Goals - 09/19/21 1126   ? ?  ? SLP LONG TERM GOAL #1  ? Title Pt will demonstrate adequate organizational skills to complete mod complex/complex therapy tasks with modified independence (compensations) in 4 sessions   ? Time 6   ? Period Weeks   ? Status On-going   ?  ? SLP LONG TERM GOAL #2  ? Title pt will demo anticipatory  awareness by pre-planning/organizing a task prior to initiation, independently, in 3 sessions   ? Time 6   ? Period Weeks   ? Status On-going   ?  ? SLP LONG TERM GOAL #3  ? Title Pt will demonstrate adequate organizational skills to complete mod complex/complex therapy tasks with modified independence (compensations) in 4 sessions   ? Time 10   ? Period Weeks   ? Status On-going   ?  ? SLP LONG TERM GOAL #4  ? Title pt will independently use memory strategies for work-like tasks in 5 sessions   ? Time 10   ? Period Weeks   ? Status On-going   ? ?  ?  ? ?  ? ? ? Plan - 09/19/21 1123   ? ? 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 [name] is making effort to return to IADL's and exercise. She continues to demonstrate distracablility with alternating attention tasks. She hopes to return to work - Continue skilled ST to maximize cognition for safety, return to PLOF and possible return to work in some capacity   ? Speech Therapy Frequency 2x / week   ? Duration 12 weeks   ? Treatment/Interventions Compensatory techniques;Environmental controls;SLP instruction and feedback;Cueing hierarchy;Cognitive reorganization;Compensatory strategies;Patient/family education;Internal/external aids   ? Potential to Achieve Goals Good   ? ?  ?  ? ?  ? ? ?Patient will benefit from skilled therapeutic intervention in order to improve the following deficits and impairments:   ?Cognitive communication deficit ? ? ? ?Problem List ?Patient Active Problem List  ? Diagnosis Date Noted  ? Acute ischemic left PCA stroke ([place]) 08/03/2021  ? Cerebellar infarction ([place]) 07/30/2021  ? Cerebral edema ([place]) 07/30/2021  ? Hyperlipidemia 07/30/2021  ? Prediabetes 07/30/2021  ? ? ?Chrystle Murillo, Annye Rusk, CCC-SLP ?09/19/2021, 11:27 AM ? ?[facility] ?[place] ?[place], [place], [address] ?Phone: [phone]   Fax:  [phone] ? ? ?Name: [name] ?MRN: [id number] ?Date of Birth: 10-Dec-1960 ? ?

## 2021-09-19 NOTE — Therapy (Signed)
?OUTPATIENT PHYSICAL THERAPY TREATMENT NOTE ? ? ?Patient Name: [name] ?MRN: [id number] ?DOB:[birth year], 61 y.o., female ?Today's Date: 09/19/2021 ? ?PCP: Jearld Fenton, NP ?REFERRING PROVIDER: Izora Ribas, MD  ? ? PT End of Session - 09/19/21 0815   ? ? Visit Number 6   ? Number of Visits 17   ? Date for PT Re-Evaluation 11/23/21   pushed out due to scheduling  ? Authorization Type UHC (VL: 60 per discipline)   ? PT Start Time [phone]   pt arrived late  ? PT Stop Time 0845   ? PT Time Calculation (min) 39 min   ? Equipment Utilized During Treatment Gait belt   ? Activity Tolerance Patient tolerated treatment well   ? Behavior During Therapy [facility] for tasks assessed/performed   ? ?  ?  ? ?  ? ? ? ?Past Medical History:  ?Diagnosis Date  ? Blood in stool   ? Hyperlipidemia   ? Vaginal fibroids   ? ?No past surgical history on file. ?Patient Active Problem List  ? Diagnosis Date Noted  ? 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 Cerebral edema ([place]) 07/30/2021  ? Hyperlipidemia 07/30/2021  ? Prediabetes 07/30/2021  ? ? ?REFERRING DIAG: I63.50 (ICD-10-CM) - Cerebral artery occlusion with cerebral infarction ([place])  ? ?THERAPY DIAG:  ?Unsteadiness on feet ? ?Muscle weakness (generalized) ? ?Other abnormalities of gait and mobility ? ?PERTINENT HISTORY: HLD, Prediabetes ? ?PRECAUTIONS: Fall ? ?SUBJECTIVE: Reported walking for 30-35 minutes in the park and it went well. Reports her legs are sore but manageable. No falls  ? ?PAIN:  ?Are you having pain? No ? ?VITALS ? Pre-session: BP- 110/67 mmHg, L arm, taken in sitting  ? ? ?TODAY'S TREATMENT:  ? ?Ther Ex: ?Completed SciFit on Level 2 for 7 mins w/ BUE/BLE for dynamic cardiovascular warmup and UE/LE coordination. Cues for large movements and maintain steps/per minute >/= 100. ? ?Gait training: ? ?// BARS for BLE strength and stair/curb training ?-Alt. Eccentric fwd step downs w/heel tap from 6" step without UE  support, x10 reps per side. Pt required min A and LUE support when descending w/RLE due to LLE weakness and fear-avoidance behavior.  ?-Progressed to lateral step downs w/heel tap from 6" box without UE support, x10 reps per side. Min verbal cues for eccentric control and slowed lowering of limb to ground for improved glute/hip strength. Min A for steadying assist.  ? ? ?PATIENT EDUCATION: ?Education details: Continue walking program  ?Person educated: Patient ?Education method: Explanation and Demonstration ?Education comprehension: verbalized understanding ? ? ?HOME EXERCISE PROGRAM: ?Access Code: [email or web address] ? ? PT Short Term Goals  ? ?  ? PT SHORT TERM GOAL #1  ? Title Pt will be indepdent with initial HEP for balance/strength   ? Baseline no HEP established   ? Time 4   ? Period Weeks   ? Status New   ? Target Date 09/28/21   ?  ? PT SHORT TERM GOAL #2  ? Title Pt will improve TUG to </= 16 seconds with LRAD to demonstrate reduced fall risk   ? Baseline 22.72 secs with RW   ? Time 4   ? Period Weeks   ? Status New   ?  ? PT SHORT TERM GOAL #3  ? Title Patient will improve gait speed to >/= 2.0 ft/sec w/ LRAD to demo improved community mobility   ? Baseline 1.90 ft/sec   ?  Time 4   ? Period Weeks   ? Status New   ?  ? PT SHORT TERM GOAL #4  ? Title Pt will improve Berg score to >42/56 for reduced risk of falls   ? Baseline 35/56   ? Time 4   ? Period Weeks   ? Status Revised   ?  ? PT SHORT TERM GOAL #5  ? Title Pt will be able to ambulate >/= 300 ft w/ LRAD and supervision to demo improved household/community mobility   ? Baseline 120' CGA   ? Time 4   ? Period Weeks   ? Status New   ? ?  ?  ? ?  ? ? ? PT Long Term Goals  ? ?  ? PT LONG TERM GOAL #1  ? Title Pt will be independent with final HEP for balance/strength   ? Baseline no HEP established   ? Time 8   ? Period Weeks   ? Status New   ? Target Date 10/26/21   ?  ? PT LONG TERM GOAL #2  ? Title Pt will perform >48/56 on Berg for improved dynamic balance and  reduced fall risk   ? Baseline 35/56   ? Time 8   ? Period Weeks   ? Status Revised   ?  ? PT LONG TERM GOAL #3  ? Title Pt will improve TUG to </= 12 seconds w/ LRAD to demo improved balance   ? Baseline 22.72 secs w/ RW   ? Time 8   ? Period Weeks   ? Status New   ?  ? PT LONG TERM GOAL #4  ? Title Pt will improve 5x sit <> stand to </= 12 seconds to demo improved balance   ? Baseline 14.22 secs   ? Time 8   ? Period Weeks   ? Status New   ?  ? PT LONG TERM GOAL #5  ? Title Pt will improve gait speed to >/= 2.6 ft/sec to demo improved community ambulator   ? Baseline 1.90 ft/sec   ? Time 8   ? Period Weeks   ? Status New   ?  ? PT LONG TERM GOAL #6  ? Title Pt will be able to ascend/descend 8 stairs with reciprocal pattern and supervision with use of single rail vs. no rail   ? Baseline step to pattern bil rails   ? Time 8   ? Period Weeks   ? Status New   ?  ? PT LONG TERM GOAL #7  ? Title Pt will improve FOTO to >/= 62%   ? Baseline 49%   ? Time 8   ? Period Weeks   ? Status New   ? ?  ?  ? ?  ? ? ? Plan  ? ? Clinical Impression Statement Continued skilled PT session focused on reduced AD use with ambulation and curb/ramp negotiation. With curb and stair negotiation, increased challenge noted with reduced UE support. Require CGA due to unsteadiness and intermittent cues for seqnuencing to promote improved stability. Pt tolerating progression well. Will continue per POC.   ? Personal Factors and Comorbidities Comorbidity 2;Transportation   ? Comorbidities HLD, Prediabetes   ? Examination-Activity Limitations Transfers;Stairs;Stand;Locomotion Level;Squat;Bend   ? Examination-Participation Restrictions Occupation;Cleaning;Community Activity;Driving   ? Stability/Clinical Decision Making Stable/Uncomplicated   ? Rehab Potential Good   ? PT Frequency 2x / week   ? PT Duration 8 weeks   plus eval  ?  PT Treatment/Interventions ADLs/Self Care Home Management;Aquatic Therapy;Canalith Repostioning;Cryotherapy;Electrical  Stimulation;Moist Heat;DME Instruction;Gait training;Stair training;Functional mobility training;Therapeutic activities;Therapeutic exercise;Balance training;Neuromuscular re-education;Patient/family education;Orthotic Fit/Training;Manual techniques;Passive range of motion;Vestibular;Energy conservation   ? PT Next Visit Plan Continue gait iwhtout AD, stairs and curb negotiation. Update HEP as needed, high-level balance, single leg stance, BLE strength   ? PT Home Exercise Plan [email or web address]   ? Consulted and Agree with Plan of Care Patient   ? ?  ?  ? ?  ? ? ? ? ?Cruzita Lederer Cordai Rodrigue, PT, DPT ?09/19/2021, 8:51 AM ? ?  ? ?

## 2021-09-19 NOTE — Therapy (Signed)
[place] [address] ?[place], [place], [address] ?Phone: [phone]   Fax:  [phone] ? ?Occupational Therapy Treatment ? ?Patient Details  ?Name: [name] ?MRN: [id number] ?Date of Birth: 1960/08/21 ?Referring Provider (OT): Jinger Neighbors, MD ? ? ?Encounter Date: 09/19/2021 ? ? OT End of Session - 09/19/21 0850   ? ? Visit Number 6   ? Number of Visits 17   ? Date for OT Re-Evaluation 11/09/21   ? Authorization Type UHC   ? Authorization Time Period VL: 60 combined (hard max), No Auth Req'd   ? Authorization - Number of Visits 20   ? OT Start Time 0845   ? OT Stop Time 0930   ? OT Time Calculation (min) 45 min   ? Activity Tolerance Patient tolerated treatment well   ? Behavior During Therapy [facility] for tasks assessed/performed   ? ?  ?  ? ?  ? ? ?Past Medical History:  ?Diagnosis Date  ? Blood in stool   ? Hyperlipidemia   ? Vaginal fibroids   ? ? ?History reviewed. No pertinent surgical history. ? ?There were no vitals filed for this visit. ? ? Subjective Assessment - 09/19/21 0847   ? ? Subjective  "some days is alright", "I tried to drive" Pt received from PT. Denies pain.   ? Pertinent History hyperlipidemia,  prediabetes and tobacco use.   ? Limitations Fall Risk   ? Currently in Pain? No/denies   ? Pain Score 0-No pain   ? ?  ?  ? ?  ? ? ? ? ? ? ? ? ? ? ? ? ? ? ? OT Treatments/Exercises (OP) - 09/19/21 5681   ? ?  ? ADLs  ? Driving patient reports trialing driving in her apartment complex but reports struggling with problem solving and processing in the moment with running over vs dodging a plastic bag. Made her paranoid, per report. Pt reports MD said she could start driving when she was ready however reports that was an "eye opener" for her. Discussed gradual driving program - with licensed driver in empty lot at first, etc.   ?  ? Cognitive Exercises  ? Problem Solving functional problem solving with organizing day activity - pt with  difficulty with breaking down steps and demonstrates some perseveration and overthinking on certain factors making it more difficult for sequencing and processing different items for activity. With increased time for beginning, patient was able to process and place items in schedule with mod/max cues for attending to all tasks.   ? Attention Span Alternating Constant Therapy level 1 with alternating alphabetizing words. Pt had max difficulty and max cues reqd for beginning exercise and understanding procedure. 87% accuracy with average response time of 80.22s   ? ?  ?  ? ?  ? ? ? ? ? ? ? ? ? ? ? OT Short Term Goals - 09/12/21 0941   ? ?  ? OT SHORT TERM GOAL #1  ? Title Pt will be independent with initial HEP   ? Time 4   ? Period Weeks   ? Status Achieved   independent with cooridnation and putty HEP 09/12/21  ? Target Date 09/28/21   ?  ? OT SHORT TERM GOAL #2  ? Title Pt will verbalize understanding of memory compensatory strategies   ? Time 4   ? Period Weeks   ? Status Achieved   using memory notebook and cell phone 09/12/21  ?  ?  OT SHORT TERM GOAL #3  ? Title Pt will perform environmental scanning in moderately distracting environment with 90% accuracy or greater.   ? Time 4   ? Period Weeks   ? Status Achieved   93% accuracy 09/12/21  ?  ? OT SHORT TERM GOAL #4  ? Title Pt will increase grip strength by 5 lbs or greater in LUE.   ? Baseline L 19.6, R 35   ? Time 4   ? Period Weeks   ? Status Achieved   38.8 lbs LUE 09/12/21  ?  ? OT SHORT TERM GOAL #5  ? Title Pt will improve 9 hole peg test by 3 seconds or more in BUE for increase in fine motor coordination and processing speed   ? Baseline R 54.37s, L 45.37s   ? Time 4   ? Period Weeks   ? Status Achieved   40.59s with LUE 09/12/21  ? ?  ?  ? ?  ? ? ? ? OT Long Term Goals - 09/12/21 0958   ? ?  ? OT LONG TERM GOAL #1  ? Title Pt will be independent with updated HEP   ? Time 10   ? Period Weeks   ? Status On-going   ? Target Date 11/09/21   ?  ? OT LONG TERM GOAL #2   ? Title Pt will increase 9 hole peg test score with BUE by 8 seconds or greater in order to increase fine motor coordination.   ? Baseline R 54.37s, L 45.37s   ? Time 10   ? Period Weeks   ? Status On-going   ?  ? OT LONG TERM GOAL #3  ? Title Pt will be increase grip strength in LUE by 10 lbs or greater   ? Baseline L 19.6, R 35   ? Time 10   ? Period Weeks   ? Status Achieved   38.8 lbs LUE 09/12/21  ?  ? OT LONG TERM GOAL #4  ? Title Pt will complete alternating attention task with 90% accuracy or greater and with appropriate time.   ? Baseline Trail Making B > 5 minutes with 100% accuracy   ? Time 10   ? Period Weeks   ? Status On-going   ?  ? OT LONG TERM GOAL #5  ? Title Pt will increase shoulder range of motion in LUE to 130 degrees or greater for increasing ability for overhead reach.   ? Baseline 125 degreees   ? Time 10   ? Period Weeks   ? Status On-going   ?  ? OT LONG TERM GOAL #6  ? Title Complete FOTO at discharge with score of 70% or greater.   ? Baseline 62%   ? Time 10   ? Period Weeks   ? Status On-going   ? ?  ?  ? ?  ? ? ? ? ? ? ? ? Plan - 09/19/21 0907   ? ? Clinical Impression Statement Pt continues to improve and progress towards goals. Some difficulty today with higher level processing and sequencing and alternating attention tasks.   ? OT Occupational Profile and History Problem Focused Assessment - Including review of records relating to presenting problem   ? Occupational performance deficits (Please refer to evaluation for details): ADL's;IADL's;Leisure;Work   ? Body Structure / Function / Physical Skills ADL;Decreased knowledge of precautions;Coordination;Decreased knowledge of use of DME;Strength;GMC;IADL;ROM;FMC;UE functional use;Dexterity;Flexibility;Vision   ? Cognitive Skills Attention;Problem Solve   ?  Rehab Potential Good   ? Clinical Decision Making Limited treatment options, no task modification necessary   ? Comorbidities Affecting Occupational Performance: None   ?  Modification or Assistance to Complete Evaluation  No modification of tasks or assist necessary to complete eval   ? OT Frequency 2x / week   ? OT Duration Other (comment)   16 visits over 10 weeks  ? OT Treatment/Interventions Aquatic Therapy;Self-care/ADL training;Moist Heat;Fluidtherapy;DME and/or AE instruction;Therapeutic activities;Therapeutic exercise;Cognitive remediation/compensation;Visual/perceptual remediation/compensation;Passive range of motion;Neuromuscular education;Functional Mobility Training;Energy conservation;Patient/family education   ? Plan envionemtental scanning with cognitive component, alternating attention tasks, cognitive re-training   ? OT Home Exercise Plan FM Coordination issued 09/05/21   ? Consulted and Agree with Plan of Care Patient   ? ?  ?  ? ?  ? ? ?Patient will benefit from skilled therapeutic intervention in order to improve the following deficits and impairments:   ?Body Structure / Function / Physical Skills: ADL, Decreased knowledge of precautions, Coordination, Decreased knowledge of use of DME, Strength, GMC, IADL, ROM, FMC, UE functional use, Dexterity, Flexibility, Vision ?Cognitive Skills: Attention, Problem Solve ?  ? ? ?Visit Diagnosis: ?Hemiplegia and hemiparesis following cerebral infarction affecting left non-dominant side ([place]) ? ?Unsteadiness on feet ? ?Muscle weakness (generalized) ? ?Other abnormalities of gait and mobility ? ?Attention and concentration deficit ? ?Other lack of coordination ? ?Visuospatial deficit ? ? ? ?Problem List ?Patient Active Problem List  ? Diagnosis Date Noted  ? Acute ischemic left PCA stroke ([place]) 08/03/2021  ? 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 Cerebral edema ([place]) 07/30/2021  ? Hyperlipidemia 07/30/2021  ? Prediabetes 07/30/2021  ? ? ?Zachery Conch, OT ?09/19/2021, 9:46 AM ? ?[place] [address] ?[place], [place], [address] ?Phone: [phone]   Fax:   [phone] ? ?Name: ROSSETTA KAMA ?MRN: [id number] ?Date of Birth: Nov 26, 1960 ? ?

## 2021-09-19 NOTE — Patient Instructions (Signed)
? ?  At work, you may find you need to take some breaks in a quiet, low lit place every so often to let your brain rest from all of the noise, lights, stimulation (your car if need be) ? ?You will be tired when you return to work because your endurance and stamina are down from the stroke - so let your family and bf know that you won't be going out after work and may need to rest up on the weekends as well ? ?At work, you may have to tell your team at the beginning of the day that you can't have information thrown at your and you cant be interrupted and that you may hold up your finger to let them know you need to finish a task before you can answer questions or help them ? ?Remember, your stroke is invisible, you may have to remind people that your brain is healing and you can't have a lot of information or stimulation at once ? ?Tell your boss to put his  requests in writing (emails) and that you will check emails 3x a day (after breaks and after lunch) ? ? ?

## 2021-09-24 ENCOUNTER — Ambulatory Visit: Payer: 59

## 2021-09-24 ENCOUNTER — Encounter: Payer: Self-pay | Admitting: Occupational Therapy

## 2021-09-24 ENCOUNTER — Other Ambulatory Visit: Payer: Self-pay

## 2021-09-24 ENCOUNTER — Ambulatory Visit: Payer: 59 | Admitting: Speech Pathology

## 2021-09-24 ENCOUNTER — Ambulatory Visit: Payer: 59 | Admitting: Occupational Therapy

## 2021-09-24 ENCOUNTER — Encounter: Payer: Self-pay | Admitting: Speech Pathology

## 2021-09-24 VITALS — BP 124/74 | HR 62

## 2021-09-24 DIAGNOSIS — I69354 Hemiplegia and hemiparesis following cerebral infarction affecting left non-dominant side: Secondary | ICD-10-CM

## 2021-09-24 DIAGNOSIS — R2681 Unsteadiness on feet: Secondary | ICD-10-CM

## 2021-09-24 DIAGNOSIS — M6281 Muscle weakness (generalized): Secondary | ICD-10-CM

## 2021-09-24 DIAGNOSIS — R278 Other lack of coordination: Secondary | ICD-10-CM

## 2021-09-24 DIAGNOSIS — R262 Difficulty in walking, not elsewhere classified: Secondary | ICD-10-CM

## 2021-09-24 DIAGNOSIS — R41842 Visuospatial deficit: Secondary | ICD-10-CM

## 2021-09-24 DIAGNOSIS — R41841 Cognitive communication deficit: Secondary | ICD-10-CM

## 2021-09-24 DIAGNOSIS — R41840 Attention and concentration deficit: Secondary | ICD-10-CM

## 2021-09-24 DIAGNOSIS — R2689 Other abnormalities of gait and mobility: Secondary | ICD-10-CM

## 2021-09-24 NOTE — Therapy (Signed)
OUTPATIENT PHYSICAL THERAPY TREATMENT NOTE   Patient Name: [name] MRN: [id number] DOB:[birth year], 61 y.o., female Today's Date: 09/24/2021  PCP: Jearld Fenton, NP REFERRING PROVIDER: Izora Ribas, MD    PT End of Session - 09/24/21 0810     Visit Number 7    Number of Visits 17    Date for PT Re-Evaluation 11/23/21   pushed out due to scheduling   Authorization Type UHC (VL: 60 per discipline)    PT Start Time 0808   patient arrrived to session late   PT Stop Time 0843    PT Time Calculation (min) 35 min    Equipment Utilized During Treatment Gait belt    Activity Tolerance Patient tolerated treatment well    Behavior During Therapy WFL for tasks assessed/performed              Past Medical History:  Diagnosis Date   Blood in stool    Hyperlipidemia    Vaginal fibroids    No past surgical history on file. 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 No new changes/complaints. Tried to continue walking this weekend at park. Walking in with [facility] today.  PAIN:  Are you having pain? No  VITALS  Pre-session: BP- 124/74 mmHg, L arm, taken in sitting   Post - session: BP - 115/66 mmHg, L arm, taken in sitting   TODAY'S TREATMENT:   Ther Ex: Completed SciFit on Level 3.5 for 7 mins w/ BUE/BLE for dynamic cardiovascular warmup and UE/LE coordination. Pt able to maintain steps/minute > 100 without cues. Pt  tolerating increase in resistance well.  Gait: Gait pattern: step through pattern, decreased step length- Right, decreased step length- Left, and decreased stance time- Right Distance walked: 200 ft (without use of AD), plus additional clinic distance. Assistive device utilized: SPC an None Level of assistance: CGA Gait Speed: 13.97 secs without AD = 2.35 ft/sec Comments: Pt ambulating in with SPC today, PT adjusted for proper height.    [facility] PT Assessment - 09/24/21 0001       Standardized Balance Assessment   Standardized Balance Assessment Timed Up and Go Test;Berg Balance Test      Berg Balance Test   Sit to Stand Able to stand without using hands and stabilize independently    Standing Unsupported Able to stand safely 2 minutes    Sitting with Back Unsupported but Feet Supported on Floor or Stool Able to sit safely and securely 2 minutes    Stand to Sit Sits safely with minimal use of hands    Transfers Able to transfer safely, minor use of hands    Standing Unsupported with Eyes Closed Able to stand 10 seconds safely    Standing Unsupported with Feet Together Able to place feet together independently and stand 1 minute safely    From Standing, Reach Forward with Outstretched Arm Can reach forward >12 cm safely (5")   9"   From Standing Position, Pick up Object from  Floor Able to pick up shoe safely and easily    From Standing Position, Turn to Look Behind Over each Shoulder Looks behind one side only/other side shows less weight shift    Turn 360 Degrees Able to turn 360 degrees safely in 4 seconds or less   reports mild dizziness   Standing Unsupported, Alternately Place Feet on Step/Stool Able to stand independently and safely and complete 8 steps in 20 seconds    Standing Unsupported, One Foot in Front Able to plae foot ahead of the other independently and hold 30 seconds    Standing on One Leg Able to lift leg independently and hold equal to or more than 3 seconds    Total  Score 51    Berg comment: 51/56      Timed Up and Go Test   TUG Normal TUG    Normal TUG (seconds) 11.06   no use of AD             PATIENT EDUCATION: Education details: Progress toward STGs Person educated: Patient Education method: Customer service manager Education comprehension: verbalized understanding   HOME EXERCISE PROGRAM: Access Code: [email or web address]   PT Short Term Goals      PT SHORT TERM GOAL #1   Title Pt will be indepdent with initial HEP for balance/strength    Baseline no HEP established    Time 4    Period Weeks    Status New    Target Date 09/28/21      PT SHORT TERM GOAL #2   Title Pt will improve TUG to </= 16 seconds with LRAD to demonstrate reduced fall risk    Baseline 22.72 secs with RW; 11.06 secs no AD   Time 4    Period Weeks    Status Achieved      PT SHORT TERM GOAL #3   Title Patient will improve gait speed to >/= 2.0 ft/sec w/ LRAD to demo improved community mobility    Baseline 1.90 ft/sec; 2.35 ft/sec   Time 4    Period Weeks    Status Achieved     PT SHORT TERM GOAL #4   Title Pt will improve Berg score to >42/56 for reduced risk of falls    Baseline 35/56; 51/56   Time 4    Period Weeks    Status Achieved     PT SHORT TERM GOAL #5   Title Pt will be able to ambulate >/= 300 ft w/ LRAD and supervision to demo improved household/community mobility    Baseline 120' CGA    Time 4    Period Weeks    Status New              PT Long Term Goals      PT LONG TERM GOAL #1   Title Pt will be independent with final HEP for balance/strength    Baseline no HEP established    Time 8    Period Weeks    Status New    Target Date 10/26/21      PT LONG TERM GOAL #2   Title Pt will perform >54/56 on Berg for improved dynamic balance and reduced fall risk    Baseline 35/56    Time 8    Period Weeks    Status Revised      PT LONG TERM GOAL #3   Title Pt will improve TUG to </= 10 seconds w/ LRAD to demo improved  balance  Baseline 22.72 secs w/ RW    Time 8    Period Weeks    Status New      PT LONG TERM GOAL #4   Title Pt will improve 5x sit <> stand to </= 12 seconds to demo improved balance    Baseline 14.22 secs    Time 8    Period Weeks    Status New      PT LONG TERM GOAL #5   Title Pt will improve gait speed to >/= 2.6 ft/sec to demo improved community ambulator    Baseline 1.90 ft/sec    Time 8    Period Weeks    Status New      PT LONG TERM GOAL #6   Title Pt will be able to ascend/descend 8 stairs with reciprocal pattern and supervision with use of single rail vs. no rail    Baseline step to pattern bil rails    Time 8    Period Weeks    Status New      PT LONG TERM GOAL #7   Title Pt will improve FOTO to >/= 62%    Baseline 49%    Time 8    Period Weeks    Status New              Plan    Clinical Impression Statement Began to assess patient's progress toward STG's during session. With [employer], patient scoring 51/56 indicating improved balance and reduced fall risk. Patient also improved gait speed to 2.35 ft/sec without use of AD, still indicating limited community ambulator. Updated LTG due to significant progress noted with PT services. Will finish assessment of progress toward other STG's at next session.    Personal Factors and Comorbidities Comorbidity 2;Transportation    Comorbidities HLD, Prediabetes    Examination-Activity Limitations Transfers;Stairs;Stand;Locomotion Level;Squat;Bend    Examination-Participation Restrictions Occupation;Cleaning;Community Activity;Driving    Stability/Clinical Decision Making Stable/Uncomplicated    Rehab Potential Good    PT Frequency 2x / week    PT Duration 8 weeks   plus eval   PT Treatment/Interventions ADLs/Self Care Home Management;Aquatic Therapy;Canalith Repostioning;Cryotherapy;Electrical Stimulation;Moist Heat;DME Instruction;Gait training;Stair training;Functional mobility training;Therapeutic  activities;Therapeutic exercise;Balance training;Neuromuscular re-education;Patient/family education;Orthotic Fit/Training;Manual techniques;Passive range of motion;Vestibular;Energy conservation    PT Next Visit Plan Finish Checking STGs. Continue gait without AD, stairs and curb negotiation. Update HEP as needed, high-level balance, single leg stance, BLE strength    PT Home Exercise Plan [email or web address]    Consulted and Agree with Plan of Care Patient               Jones Bales, PT, DPT 09/24/2021, 8:47 AM

## 2021-09-24 NOTE — Therapy (Signed)
[facility] ?[place] ?[place], [place], [address] ?Phone: [phone]   Fax:  [phone] ? ?Speech Language Pathology Treatment ? ?Patient Details  ?Name: [name] ?MRN: [id number] ?Date of Birth: [birth year] ?Referring Provider (SLP): Kingsley Callander., MD ? ? ?Encounter Date: 09/24/2021 ? ? End of Session - 09/24/21 1229   ? ? Visit Number 6   ? Number of Visits 25   ? Date for SLP Re-Evaluation 11/29/21   ? SLP Start Time 0930   ? SLP Stop Time  1014   ? SLP Time Calculation (min) 44 min   ? Activity Tolerance Patient tolerated treatment well   ? ?  ?  ? ?  ? ? ?Past Medical History:  ?Diagnosis Date  ? Blood in stool   ? Hyperlipidemia   ? Vaginal fibroids   ? ? ?History reviewed. No pertinent surgical history. ? ?There were no vitals filed for this visit. ? ? Subjective Assessment - 09/24/21 0938   ? ? Subjective "The cane is new"   ? Currently in Pain? No/denies   ? ?  ?  ? ?  ? ? ? ? ? ? ? ? ADULT SLP TREATMENT - 09/24/21 0938   ? ?  ? General Information  ? Behavior/Cognition Alert;Cooperative;Pleasant mood   ?  ? Treatment Provided  ? Treatment provided Cognitive-Linquistic   ?  ? Cognitive-Linquistic Treatment  ? Treatment focused on Cognition;Patient/family/caregiver education   ? Skilled Treatment [name] returns with [facility] complete and correct. She is using a cane and endorses success with this. Targeted attention to detail and error awareness in fucntional math task - [name] double checked with calculator with min verbal cues. She ID'd errors with rare min A to mod I.With questioning cues, [name] ID'd 3 strategies for attention and memory for return to working night shift. She generated strategy of pre -prepping meals and ID'd 3 places she can find low sodium hearth healty recipes and foods.   ?  ? Assessment / Recommendations / Plan  ? Plan Continue with current plan of care   ?  ? Progression Toward Goals  ? Progression toward goals Progressing  toward goals   ? ?  ?  ? ?  ? ? ? SLP Education - 09/24/21 1227   ? ? Education Details comepensations for attention,memory   ? Person(s) Educated Patient   ? Methods Explanation;Verbal cues;Handout   ? Comprehension Verbalized understanding;Verbal cues required   ? ?  ?  ? ?  ? ? ? SLP Short Term Goals - 09/24/21 1228   ? ?  ? SLP SHORT TERM GOAL #1  ? Title Pt will use comensatory strategies for memory successfully between 3 sessions   ? Baseline 09/19/21   ? Time 1   ? Period Weeks   ? Status On-going   ? Target Date 09/28/21   ?  ? SLP SHORT TERM GOAL #2  ? Title Pt will demonstrate adeuqate organizational skills to complete mod complex therapy tasks with modified independence (compensations) in 2 sessions   ? Time 1   ? Period Weeks   ? Status On-going   ? Target Date 09/28/21   ?  ? SLP SHORT TERM GOAL #3  ? Title pt will demonstrate emergent awareness during a therapy task in order to make corrections in 3 sessions   ? Baseline 09/25/30   ? Time 1   ? Period Weeks   ? Status On-going   ?  Target Date 09/28/21   ?  ? SLP SHORT TERM GOAL #4  ? Title pt will complete cognitive testing in first 1-2 sessions   ? Time 2   ? Period --   sessions  ? Status Achieved   ? Target Date 09/14/21   ? ?  ?  ? ?  ? ? ? SLP Long Term Goals - 09/24/21 1228   ? ?  ? SLP LONG TERM GOAL #1  ? Title Pt will demonstrate adequate organizational skills to complete mod complex/complex therapy tasks with modified independence (compensations) in 4 sessions   ? Time 5   ? Period Weeks   ? Status On-going   ?  ? SLP LONG TERM GOAL #2  ? Title pt will demo anticipatory awareness by pre-planning/organizing a task prior to initiation, independently, in 3 sessions   ? Time 5   ? Period Weeks   ? Status On-going   ?  ? SLP LONG TERM GOAL #3  ? Title Pt will demonstrate adequate organizational skills to complete mod complex/complex therapy tasks with modified independence (compensations) in 4 sessions   ? Time 9   ? Period Weeks   ? Status On-going    ?  ? SLP LONG TERM GOAL #4  ? Title pt will independently use memory strategies for work-like tasks in 5 sessions   ? Time 9   ? Period Weeks   ? Status On-going   ? ?  ?  ? ?  ? ? ? Plan - 09/24/21 1227   ? ? 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 Tere is making effort to return to IADL's and exercise. She continues to demonstrate distracablility with alternating attention tasks and ID and self correcting errors.  She hopes to return to work - Continue skilled ST to maximize cognition for safety, return to PLOF and possible return to work in some capacity   ? Speech Therapy Frequency 2x / week   ? Duration 12 weeks   ? Treatment/Interventions Compensatory techniques;Environmental controls;SLP instruction and feedback;Cueing hierarchy;Cognitive reorganization;Compensatory strategies;Patient/family education;Internal/external aids   ? Potential to Achieve Goals Good   ? ?  ?  ? ?  ? ? ?Patient will benefit from skilled therapeutic intervention in order to improve the following deficits and impairments:   ?Cognitive communication deficit ? ? ? ?Problem List ?Patient Active Problem List  ? Diagnosis Date Noted  ? 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 Cerebral edema ([place]) 07/30/2021  ? Hyperlipidemia 07/30/2021  ? Prediabetes 07/30/2021  ? ? ?[name], Annye Rusk, CCC-SLP ?09/24/2021, 12:29 PM ? ?[place] ?[place] ?[place], [place], [address] ?Phone: [phone]   Fax:  [phone] ? ? ?Name: [name] ?MRN: [id number] ?Date of Birth: [birth year] ? ?

## 2021-09-24 NOTE — Therapy (Signed)
[place] ?[place] ?[place], [place], [address] ?Phone: [phone]   Fax:  [phone] ? ?Occupational Therapy Treatment ? ?Patient Details  ?Name: [name] ?MRN: [id number] ?Date of Birth: 20-Aug-1960 ?Referring Provider (OT): Jinger Neighbors, MD ? ? ?Encounter Date: 09/24/2021 ? ? OT End of Session - 09/24/21 0849   ? ? Visit Number 7   ? Number of Visits 17   ? Date for OT Re-Evaluation 11/09/21   ? Authorization Type UHC   ? Authorization Time Period VL: 60 combined (hard max), No Auth Req'd   ? Authorization - Number of Visits 20   ? OT Start Time [phone]   ? OT Stop Time 0930   ? OT Time Calculation (min) 41 min   ? Activity Tolerance Patient tolerated treatment well   ? Behavior During Therapy [facility] for tasks assessed/performed   ? ?  ?  ? ?  ? ? ?Past Medical History:  ?Diagnosis Date  ? Blood in stool   ? Hyperlipidemia   ? Vaginal fibroids   ? ? ?History reviewed. No pertinent surgical history. ? ?There were no vitals filed for this visit. ? ? Subjective Assessment - 09/24/21 0849   ? ? Subjective  "I'm getting better"   ? Pertinent History hyperlipidemia,  prediabetes and tobacco use.   ? Limitations Fall Risk   ? Currently in Pain? No/denies   ? ?  ?  ? ?  ? ? ? ? ?Copying PVC frame for visual scanning, problem solving, planning, and organization.  Min v.c. initially to organize pieces, then pt copied with good accuracy, but needed incr time. ? ?Ambulating while performing environmental scanning with divided attention (performing category generation with min-mod cueing--Pt alternated attention vs. Divided).  Found 13/15 items on first pass, then remaining 2 on 2nd pass. ? ?Logic Links x3 with (blue/green difficulty) with min-mod cueing for problem solving.  Pt did initiate checking herself/re-reading.   ? ? ? ? ? ? OT Short Term Goals - 09/12/21 0941   ? ?  ? OT SHORT TERM GOAL #1  ? Title Pt will be independent with initial HEP   ? Time 4    ? Period Weeks   ? Status Achieved   independent with cooridnation and putty HEP 09/12/21  ? Target Date 09/28/21   ?  ? OT SHORT TERM GOAL #2  ? Title Pt will verbalize understanding of memory compensatory strategies   ? Time 4   ? Period Weeks   ? Status Achieved   using memory notebook and cell phone 09/12/21  ?  ? OT SHORT TERM GOAL #3  ? Title Pt will perform environmental scanning in moderately distracting environment with 90% accuracy or greater.   ? Time 4   ? Period Weeks   ? Status Achieved   93% accuracy 09/12/21  ?  ? OT SHORT TERM GOAL #4  ? Title Pt will increase grip strength by 5 lbs or greater in LUE.   ? Baseline L 19.6, R 35   ? Time 4   ? Period Weeks   ? Status Achieved   38.8 lbs LUE 09/12/21  ?  ? OT SHORT TERM GOAL #5  ? Title Pt will improve 9 hole peg test by 3 seconds or more in BUE for increase in fine motor coordination and processing speed   ? Baseline R 54.37s, L 45.37s   ? Time 4   ? Period Weeks   ?  Status Achieved   40.59s with LUE 09/12/21  ? ?  ?  ? ?  ? ? ? ? OT Long Term Goals - 09/12/21 0958   ? ?  ? OT LONG TERM GOAL #1  ? Title Pt will be independent with updated HEP   ? Time 10   ? Period Weeks   ? Status On-going   ? Target Date 11/09/21   ?  ? OT LONG TERM GOAL #2  ? Title Pt will increase 9 hole peg test score with BUE by 8 seconds or greater in order to increase fine motor coordination.   ? Baseline R 54.37s, L 45.37s   ? Time 10   ? Period Weeks   ? Status On-going   ?  ? OT LONG TERM GOAL #3  ? Title Pt will be increase grip strength in LUE by 10 lbs or greater   ? Baseline L 19.6, R 35   ? Time 10   ? Period Weeks   ? Status Achieved   38.8 lbs LUE 09/12/21  ?  ? OT LONG TERM GOAL #4  ? Title Pt will complete alternating attention task with 90% accuracy or greater and with appropriate time.   ? Baseline Trail Making B > 5 minutes with 100% accuracy   ? Time 10   ? Period Weeks   ? Status On-going   ?  ? OT LONG TERM GOAL #5  ? Title Pt will increase shoulder range of motion in  LUE to 130 degrees or greater for increasing ability for overhead reach.   ? Baseline 125 degreees   ? Time 10   ? Period Weeks   ? Status On-going   ?  ? OT LONG TERM GOAL #6  ? Title Complete FOTO at discharge with score of 70% or greater.   ? Baseline 62%   ? Time 10   ? Period Weeks   ? Status On-going   ? ?  ?  ? ?  ? ? ? ? ? ? ? ? Plan - 09/24/21 0853   ? ? Clinical Impression Statement Pt continues to progress towards goals.  Pt demo improved alternating attention, but difficulty with divided attention.  Pt also demo difficulty with higher level problem-solving and needs cueing and incr time.   ? OT Occupational Profile and History Problem Focused Assessment - Including review of records relating to presenting problem   ? Occupational performance deficits (Please refer to evaluation for details): ADL's;IADL's;Leisure;Work   ? Body Structure / Function / Physical Skills ADL;Decreased knowledge of precautions;Coordination;Decreased knowledge of use of DME;Strength;GMC;IADL;ROM;FMC;UE functional use;Dexterity;Flexibility;Vision   ? Cognitive Skills Attention;Problem Solve   ? Rehab Potential Good   ? Clinical Decision Making Limited treatment options, no task modification necessary   ? Comorbidities Affecting Occupational Performance: None   ? Modification or Assistance to Complete Evaluation  No modification of tasks or assist necessary to complete eval   ? OT Frequency 2x / week   ? OT Duration Other (comment)   16 visits over 10 weeks  ? OT Treatment/Interventions Aquatic Therapy;Self-care/ADL training;Moist Heat;Fluidtherapy;DME and/or AE instruction;Therapeutic activities;Therapeutic exercise;Cognitive remediation/compensation;Visual/perceptual remediation/compensation;Passive range of motion;Neuromuscular education;Functional Mobility Training;Energy conservation;Patient/family education   ? Plan envionemtental scanning with cognitive component, alternating attention tasks, cognitive re-training   ? OT  Home Exercise Plan FM Coordination issued 09/05/21   ? Consulted and Agree with Plan of Care Patient   ? ?  ?  ? ?  ? ? ?Patient  will benefit from skilled therapeutic intervention in order to improve the following deficits and impairments:   ?Body Structure / Function / Physical Skills: ADL, Decreased knowledge of precautions, Coordination, Decreased knowledge of use of DME, Strength, GMC, IADL, ROM, FMC, UE functional use, Dexterity, Flexibility, Vision ?Cognitive Skills: Attention, Problem Solve ?  ? ? ?Visit Diagnosis: ?Other lack of coordination ? ?Visuospatial deficit ? ?Hemiplegia and hemiparesis following cerebral infarction affecting left non-dominant side ([place]) ? ?Attention and concentration deficit ? ?Unsteadiness on feet ? ?Muscle weakness (generalized) ? ? ? ?Problem List ?Patient Active Problem List  ? Diagnosis Date Noted  ? Acute ischemic left PCA stroke ([place]) 08/03/2021  ? Cerebellar infarction ([place]) 07/30/2021  ? Cerebral edema ([place]) 07/30/2021  ? Hyperlipidemia 07/30/2021  ? Prediabetes 07/30/2021  ? ? Vianne Bulls, [place] ?09/24/2021, 9:49 AM ? ?[place] ?[place] ?[place], [place], [address] ?Phone: [phone]   Fax:  [phone] ? ?Name: [name] ?MRN: [id number] ?Date of Birth: 10/24/1960 ? ?Vianne Bulls, OTR/L ?[place] ?[place],   [address] ?[phone] phone ?[phone] ?09/24/21 9:49 AM ? ? ?

## 2021-09-24 NOTE — Patient Instructions (Addendum)
?  Great job listening to you body and taking a break when  your brain has had enough ? ?HW - come up with a plan for weekly meals for return to work - write down meals and snacks and times you plan to eat them  ? ?Breakfast, lunch, dinner and snack ? ?Prepack fruit, heart healthy snacks for the work week on Saturday - some things you can grab and go in the morning and plan lunches ? ?Lunch box with containers that you can put fruit, tuna, salad, shrimp,  ? ?Keep boiled eggs in the frigde  ? ?Plan for a week - boil enough rice for the week, boil eggs ahead for the week, make tuna ahead for 2-3 lunches , beans ahead- you will need to plan ahead when you go back to work ? ?Water bottle  ? ?[employer] past is pretty good ? ? ? ? ?

## 2021-09-26 ENCOUNTER — Other Ambulatory Visit: Payer: Self-pay

## 2021-09-26 ENCOUNTER — Encounter: Payer: Self-pay | Admitting: Speech Pathology

## 2021-09-26 ENCOUNTER — Ambulatory Visit: Payer: 59 | Admitting: Physical Therapy

## 2021-09-26 ENCOUNTER — Ambulatory Visit: Payer: 59 | Admitting: Speech Pathology

## 2021-09-26 ENCOUNTER — Ambulatory Visit: Payer: 59 | Admitting: Occupational Therapy

## 2021-09-26 ENCOUNTER — Encounter: Payer: Self-pay | Admitting: Occupational Therapy

## 2021-09-26 DIAGNOSIS — R41840 Attention and concentration deficit: Secondary | ICD-10-CM

## 2021-09-26 DIAGNOSIS — R2689 Other abnormalities of gait and mobility: Secondary | ICD-10-CM

## 2021-09-26 DIAGNOSIS — M6281 Muscle weakness (generalized): Secondary | ICD-10-CM

## 2021-09-26 DIAGNOSIS — R2681 Unsteadiness on feet: Secondary | ICD-10-CM

## 2021-09-26 DIAGNOSIS — R41841 Cognitive communication deficit: Secondary | ICD-10-CM

## 2021-09-26 DIAGNOSIS — R278 Other lack of coordination: Secondary | ICD-10-CM | POA: Diagnosis not present

## 2021-09-26 DIAGNOSIS — I69354 Hemiplegia and hemiparesis following cerebral infarction affecting left non-dominant side: Secondary | ICD-10-CM

## 2021-09-26 DIAGNOSIS — R41842 Visuospatial deficit: Secondary | ICD-10-CM

## 2021-09-26 NOTE — Patient Instructions (Signed)
Possible accommodations for return to work after a brain injury: ? ?[employer] is a [employer] site that gives employees and employers information on job accommodations ? ?Search brain injury  ? ?Remember you will have to advocate for yourself at work ? ?Provide a quiet workspace to reduce distractions ? ?Allow tinted glasses and hats for light sensitivity ? ?Establishing workflow checklists for difficulty recalling work tasks ? ?Allowing the use of verbal scripts to address memory, language, or social challenges ? ?Establishing and adhering to reountine procedures to support memory ? ?Providing written instructions and task lists in direct, simple language to support learning ? ?Adapting schedules and allowing more frequent breaks for fatigue management ? ?Adjusting productivity expectations in cases of slowed cognitive processing ? ?Training staff to support colleagues' communication challenges ? ?Coaching colleagues to recognize emotional triggers and support cognitive and emotional processing ? ? ?

## 2021-09-26 NOTE — Therapy (Signed)
[place] ?[place] ?[place], [place], [address] ?Phone: [phone]   Fax:  [phone] ? ?Occupational Therapy Treatment ? ?Patient Details  ?Name: [name] ?MRN: [id number] ?Date of Birth: 05/20/1961 ?Referring Provider (OT): Jinger Neighbors, MD ? ? ?Encounter Date: 09/26/2021 ? ? OT End of Session - 09/26/21 1119   ? ? Visit Number 8   ? Number of Visits 17   ? Date for OT Re-Evaluation 11/09/21   ? Authorization Type UHC   ? Authorization Time Period VL: 60 combined (hard max), No Auth Req'd   ? Authorization - Number of Visits 20   ? OT Start Time 1017   ? OT Stop Time 1100   ? OT Time Calculation (min) 43 min   ? Activity Tolerance Patient tolerated treatment well   ? Behavior During Therapy [facility] for tasks assessed/performed   ? ?  ?  ? ?  ? ? ?Past Medical History:  ?Diagnosis Date  ? Blood in stool   ? Hyperlipidemia   ? Vaginal fibroids   ? ? ?History reviewed. No pertinent surgical history. ? ?There were no vitals filed for this visit. ? ? Subjective Assessment - 09/26/21 1022   ? ? Subjective  I don't have that blurry vision anymore   ? Pertinent History hyperlipidemia,  prediabetes and tobacco use.   ? Limitations Fall Risk   ? Currently in Pain? No/denies   ? Pain Score 0-No pain   ? ?  ?  ? ?  ? ? ? ? ? ? ? ? ? ? ? ? ? ? ? OT Treatments/Exercises (OP) - 09/26/21 0001   ? ?  ? ADLs  ? ADL Comments Reviewing OT Long term goals.  Patient continues to show steady improvement.  Patient has met several long term goals - relating to fine motor coordiantion and shoulder range of motion.  Patient needs continued practice on work related skills - e.g. divided attention,   ?  ? Cognitive Exercises  ? Attention Span Divided Worked on novel tasks in moderately distracting environment.  Patient needed to take brief breaks from tasks to address mental fatigue.  Patient able to complete tasks - and with prompting able to identify one (of one) error.   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  Alternating attention and increase cognitive stamina need to be continued focus.   ?  ? Fine Motor Coordination (Hand/Wrist)  ? Fine Motor Coordination --   LT Goal check - 9 hole peg test  ? ?  ?  ? ?  ? ? ? ? ? ? ? ? ? OT Education - 09/26/21 1119   ? ? Education Details update on progress toward long term goals and potential earlier discharge than originally planned   ? Person(s) Educated Patient   ? Methods Explanation   ? Comprehension Verbalized understanding   ? ?  ?  ? ?  ? ? ? OT Short Term Goals - 09/26/21 1023   ? ?  ? OT SHORT TERM GOAL #1  ? Title Pt will be independent with initial HEP   ? Time 4   ? Period Weeks   ? Status Achieved   independent with cooridnation and putty HEP 09/12/21  ? Target Date 09/28/21   ?  ? OT SHORT TERM GOAL #2  ? Title Pt will verbalize understanding of memory compensatory strategies   ? Time 4   ? Period Weeks   ?  Status Achieved   using memory notebook and cell phone 09/12/21  ?  ? OT SHORT TERM GOAL #3  ? Title Pt will perform environmental scanning in moderately distracting environment with 90% accuracy or greater.   ? Time 4   ? Period Weeks   ? Status Achieved   93% accuracy 09/12/21  ?  ? OT SHORT TERM GOAL #4  ? Title Pt will increase grip strength by 5 lbs or greater in LUE.   ? Baseline L 19.6, R 35   ? Time 4   ? Period Weeks   ? Status Achieved   38.8 lbs LUE 09/12/21  ?  ? OT SHORT TERM GOAL #5  ? Title Pt will improve 9 hole peg test by 3 seconds or more in BUE for increase in fine motor coordination and processing speed   ? Baseline R 54.37s, L 45.37s   ? Time 4   ? Period Weeks   ? Status Achieved   40.59s with LUE 09/12/21  ? ?  ?  ? ?  ? ? ? ? OT Long Term Goals - 09/26/21 1024   ? ?  ? OT LONG TERM GOAL #1  ? Title Pt will be independent with updated HEP   ? Time 10   ? Period Weeks   ? Status On-going   ? Target Date 11/09/21   ?  ? OT LONG TERM GOAL #2  ? Title Pt will increase 9 hole peg  test score with BUE by 8 seconds or greater in order to increase fine motor coordination.   ? Baseline R 54.37s, L 45.37s,    3/15 - R - 41.06,  L 32.81   ? Time 10   ? Period Weeks   ? Status Achieved   ?  ? OT LONG TERM GOAL #3  ? Title Pt will be increase grip strength in LUE by 10 lbs or greater   ? Baseline L 19.6, R 35   ? Time 10   ? Period Weeks   ? Status Achieved   38.8 lbs LUE 09/12/21  ?  ? OT LONG TERM GOAL #4  ? Title Pt will complete alternating attention task with 90% accuracy or greater and with appropriate time.   ? Baseline Trail Making B > 5 minutes with 100% accuracy   ? Time 10   ? Period Weeks   ? Status On-going   ?  ? OT LONG TERM GOAL #5  ? Title Pt will increase shoulder range of motion in LUE to 130 degrees or greater for increasing ability for overhead reach.   ? Baseline 125 degreees   ? Time 10   ? Period Weeks   ? Status Achieved   ?  ? OT LONG TERM GOAL #6  ? Title Complete FOTO at discharge with score of 70% or greater.   ? Baseline 62%   ? Time 10   ? Period Weeks   ? Status On-going   ? ?  ?  ? ?  ? ? ? ? ? ? ? ? Plan - 09/26/21 1120   ? ? Clinical Impression Statement Pt continues to progress towards goals.  Needs further work on cognitive processing - endurance and speed/accuracy if hoping to return to work.   ? OT Occupational Profile and History Problem Focused Assessment - Including review of records relating to presenting problem   ? Occupational performance deficits (Please refer to evaluation for details): ADL's;IADL's;Leisure;Work   ?  Body Structure / Function / Physical Skills ADL;Decreased knowledge of precautions;Coordination;Decreased knowledge of use of DME;Strength;GMC;IADL;ROM;FMC;UE functional use;Dexterity;Flexibility;Vision   ? Cognitive Skills Attention;Problem Solve   ? Rehab Potential Good   ? Clinical Decision Making Limited treatment options, no task modification necessary   ? Comorbidities Affecting Occupational Performance: None   ? Modification or  Assistance to Complete Evaluation  No modification of tasks or assist necessary to complete eval   ? OT Frequency 2x / week   ? OT Duration Other (comment)   16 visits over 10 weeks  ? OT Treatment/Interventions Aquatic Therapy;Self-care/ADL training;Moist Heat;Fluidtherapy;DME and/or AE instruction;Therapeutic activities;Therapeutic exercise;Cognitive remediation/compensation;Visual/perceptual remediation/compensation;Passive range of motion;Neuromuscular education;Functional Mobility Training;Energy conservation;Patient/family education   ? Plan environmental scanning with cognitive component, alternating / divided attention tasks, cognitive re-training as related to work tasks   ? OT Home Exercise Plan FM Coordination issued 09/05/21   ? Consulted and Agree with Plan of Care Patient   ? ?  ?  ? ?  ? ? ?Patient will benefit from skilled therapeutic intervention in order to improve the following deficits and impairments:   ?Body Structure / Function / Physical Skills: ADL, Decreased knowledge of precautions, Coordination, Decreased knowledge of use of DME, Strength, GMC, IADL, ROM, FMC, UE functional use, Dexterity, Flexibility, Vision ?Cognitive Skills: Attention, Problem Solve ?  ? ? ?Visit Diagnosis: ?Attention and concentration deficit ? ?Unsteadiness on feet ? ?Muscle weakness (generalized) ? ?Other lack of coordination ? ?Visuospatial deficit ? ?Hemiplegia and hemiparesis following cerebral infarction affecting left non-dominant side ([place]) ? ? ? ?Problem List ?Patient Active Problem List  ? Diagnosis Date Noted  ? Acute ischemic left PCA stroke ([place]) 08/03/2021  ? Cerebellar infarction ([place]) 07/30/2021  ? Cerebral edema ([place]) 07/30/2021  ? Hyperlipidemia 07/30/2021  ? Prediabetes 07/30/2021  ? ? ?Mariah Milling, OT ?09/26/2021, 11:22 AM ? ?[place] ?[place] ?[place], [place], [address] ?Phone: [phone]   Fax:  [phone] ? ?Name:  [name] ?MRN: [id number] ?Date of Birth: Jul 28, 1960 ? ?

## 2021-09-26 NOTE — Therapy (Signed)
?OUTPATIENT PHYSICAL THERAPY TREATMENT NOTE ? ? ?Patient Name: [name] ?MRN: [id number] ?DOB:[birth year], 61 y.o., female ?Today's Date: 09/26/2021 ? ?PCP: Jearld Fenton, NP ?REFERRING PROVIDER: Izora Ribas, MD  ? ? PT End of Session - 09/26/21 1214   ? ? Visit Number 8   ? Number of Visits 17   ? Date for PT Re-Evaluation 11/23/21   pushed out due to scheduling  ? Authorization Type [employer] (VL: 60 per discipline)   ? PT Start Time [phone]   Pt arrived late to SLP appointment which ran over into PT session  ? PT Stop Time 1015   ? PT Time Calculation (min) 37 min   ? Equipment Utilized During Treatment Gait belt   ? Activity Tolerance Patient tolerated treatment well   ? Behavior During Therapy [facility] for tasks assessed/performed;Anxious   ? ?  ?  ? ?  ? ? ? ? ?Past Medical History:  ?Diagnosis Date  ? Blood in stool   ? Hyperlipidemia   ? Vaginal fibroids   ? ?No past surgical history on file. ?Patient Active Problem List  ? Diagnosis Date Noted  ? Acute ischemic left PCA stroke ([place]) 08/03/2021  ? 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 Cerebral edema ([place]) 07/30/2021  ? Hyperlipidemia 07/30/2021  ? Prediabetes 07/30/2021  ? ? ?REFERRING DIAG: I63.50 (ICD-10-CM) - Cerebral artery occlusion with cerebral infarction ([place])  ? ?THERAPY DIAG:  ?Muscle weakness (generalized) ? ?Unsteadiness on feet ? ?Other abnormalities of gait and mobility ? ?PERTINENT HISTORY: HLD, Prediabetes ? ?PRECAUTIONS: Fall ? ?SUBJECTIVE: Walking in with [facility] today.Got an injection for her cholesterol yesterday and pt very anxious today, repeatedly telling therapist she was afraid "it did not go in correctly". Driving herself to sessions and has been walking in the park w/her cane.  ? ?PAIN:  ?Are you having pain? No ? ?VITALS ? Pre-session: BP- 111/65 mmHg, L arm, taken in sitting  ? ? ?TODAY'S TREATMENT:  ? ? ?CURB:  ?Level of Assistance: CGA/Min A ?Assistive device utilized: Single point cane and None ?Curb Comments: blocked part  practice of alt. Tap downs from curb to strengthen stance leg and improve pt's confidence with activity. Pt very hesitant to step down w/LLE due to RLE weakness. Progressed to practicing ascending/descending curb without AD and min A due to pt fear. Added SPC for pt to use and pt able to ascend/descend >5 times with supervision.  ? ?STAIRS: ? Level of Assistance: SBA ? Stair Negotiation Technique: Step to Pattern ?Alternating Pattern  with No Rails ? Number of Stairs: 4 steps completed 8 times   ? Height of Stairs: 6"  ?Comments: Pt initially demonstrated step-to pattern to ascend/descend and very rigid trunk/UE due to fear. Progressed to pt using step-through pattern very slowly, min verbal and visual cues to relax her shoulders and allow her arms to swing to assist w/balance. Pt able to demonstrate ascending/descending w/slight arm swing twice w/distant S* using step-through pattern.  ? ?GAIT: ?Gait pattern: step through pattern, decreased arm swing- Right, decreased arm swing- Left, decreased step length- Left, decreased stance time- Right, decreased trunk rotation, and wide BOS ?Distance walked: >600' ?Assistive device utilized: None ?Level of assistance: SBA ?Comments: Pt ambulating 600' for STG assessment and then various distances throughout session. Distant S* throughout.  ? ? ? ? ?PATIENT EDUCATION: ?Education details: Progress toward STGs, using SPC on curbs, changing her self-talk to include more positive language ?Person educated: Patient ?Education method: Explanation and Demonstration ?Education  comprehension: verbalized understanding ? ? ?HOME EXERCISE PROGRAM: ?Access Code: [email or web address] ? ? PT Short Term Goals  ? ?  ? PT SHORT TERM GOAL #1  ? Title Pt will be indepdent with initial HEP for balance/strength   ? Baseline no HEP established   ? Time 4   ? Period Weeks   ? Status Achieved  ? Target Date 09/28/21   ?  ? PT SHORT TERM GOAL #2  ? Title Pt will improve TUG to </= 16 seconds with LRAD to  demonstrate reduced fall risk   ? Baseline 22.72 secs with RW; 11.06 secs no AD  ? Time 4   ? Period Weeks   ? Status Achieved   ?  ? PT SHORT TERM GOAL #3  ? Title Patient will improve gait speed to >/= 2.0 ft/sec w/ LRAD to demo improved community mobility   ? Baseline 1.90 ft/sec; 2.35 ft/sec  ? Time 4   ? Period Weeks   ? Status Achieved  ?  ? PT SHORT TERM GOAL #4  ? Title Pt will improve Berg score to >42/56 for reduced risk of falls   ? Baseline 35/56; 51/56  ? Time 4   ? Period Weeks   ? Status Achieved  ?  ? PT SHORT TERM GOAL #5  ? Title Pt will be able to ambulate >/= 300 ft w/ LRAD and supervision to demo improved household/community mobility   ? Baseline 120' CGA; 600' without AD with distant S* on 09/26/21   ? Time 4   ? Period Weeks   ? Status Achieved   ? ?  ?  ? ?  ? ? ? PT Long Term Goals  ? ?  ? PT LONG TERM GOAL #1  ? Title Pt will be independent with final HEP for balance/strength   ? Baseline no HEP established   ? Time 8   ? Period Weeks   ? Status New   ? Target Date 10/26/21   ?  ? PT LONG TERM GOAL #2  ? Title Pt will perform >54/56 on Berg for improved dynamic balance and reduced fall risk   ? Baseline 35/56   ? Time 8   ? Period Weeks   ? Status Revised   ?  ? PT LONG TERM GOAL #3  ? Title Pt will improve TUG to </= 10 seconds w/ LRAD to demo improved balance   ? Baseline 22.72 secs w/ RW   ? Time 8   ? Period Weeks   ? Status New   ?  ? PT LONG TERM GOAL #4  ? Title Pt will improve 5x sit <> stand to </= 12 seconds to demo improved balance   ? Baseline 14.22 secs   ? Time 8   ? Period Weeks   ? Status New   ?  ? PT LONG TERM GOAL #5  ? Title Pt will improve gait speed to >/= 2.6 ft/sec to demo improved community ambulator   ? Baseline 1.90 ft/sec   ? Time 8   ? Period Weeks   ? Status New   ?  ? PT LONG TERM GOAL #6  ? Title Pt will be able to ascend/descend 8 stairs with reciprocal pattern and supervision with use of single rail vs. no rail   ? Baseline step to pattern bil rails   ? Time  8   ? Period Weeks   ? Status New   ?  ?  PT LONG TERM GOAL #7  ? Title Pt will improve FOTO to >/= 62%   ? Baseline 49%   ? Time 8   ? Period Weeks   ? Status New   ? ?  ?  ? ?  ? ? ? Plan  ? ? Clinical Impression Statement Emphasis of skilled PT session on assessing STGs and curb/stair negotiation. Pt has achieved 5/5 STGs and greatly surpassed her gait goal without need for AD. Pt very anxious throughout session and frequently told herself she was "going to fail" prior to performing task. Lengthy discussion regarding changing her self-talk to include more positive language to improve her confidence, pt verbalized understanding but continues to require mod verbal cues of encouragement for tasks. Continue POC.   ? Personal Factors and Comorbidities Comorbidity 2;Transportation   ? Comorbidities HLD, Prediabetes   ? Examination-Activity Limitations Transfers;Stairs;Stand;Locomotion Level;Squat;Bend   ? Examination-Participation Restrictions Occupation;Cleaning;Community Activity;Driving   ? Stability/Clinical Decision Making Stable/Uncomplicated   ? Rehab Potential Good   ? PT Frequency 2x / week   ? PT Duration 8 weeks   plus eval  ? PT Treatment/Interventions ADLs/Self Care Home Management;Aquatic Therapy;Canalith Repostioning;Cryotherapy;Electrical Stimulation;Moist Heat;DME Instruction;Gait training;Stair training;Functional mobility training;Therapeutic activities;Therapeutic exercise;Balance training;Neuromuscular re-education;Patient/family education;Orthotic Fit/Training;Manual techniques;Passive range of motion;Vestibular;Energy conservation   ? PT Next Visit Plan Continue gait without AD, stairs and curb negotiation. Update HEP as needed, high-level balance, single leg stance, BLE strength   ? PT Home Exercise Plan [email or web address]   ? Consulted and Agree with Plan of Care Patient   ? ?  ?  ? ?  ? ? ? ? ?Cruzita Lederer Dontaye Hur, PT, DPT ?09/26/2021, 12:24 PM ? ?  ? ?

## 2021-09-26 NOTE — Therapy (Signed)
[facility] [facility] ?[place], [place], [address] ?Phone: [phone]   Fax:  [phone] ? ?Speech Language Pathology Treatment ? ?Patient Details  ?Name: [name] ?MRN: [id number] ?Date of Birth: 1960/11/15 ?Referring Provider (SLP): Kingsley Callander., MD ? ? ?Encounter Date: 09/26/2021 ? ? End of Session - 09/26/21 1507   ? ? Visit Number 7   ? Number of Visits 25   ? Date for SLP Re-Evaluation 11/29/21   ? SLP Start Time 0845   ? SLP Stop Time  0930   ? SLP Time Calculation (min) 45 min   ? Activity Tolerance Patient tolerated treatment well   ? ?  ?  ? ?  ? ? ?Past Medical History:  ?Diagnosis Date  ? Blood in stool   ? Hyperlipidemia   ? Vaginal fibroids   ? ? ?History reviewed. No pertinent surgical history. ? ?There were no vitals filed for this visit. ? ? Subjective Assessment - 09/26/21 0906   ? ? Subjective "He gave me an injection" re: MD appointment yesterday   ? Currently in Pain? No/denies   ? ?  ?  ? ?  ? ? ? ? ? ? ? ? ADULT SLP TREATMENT - 09/26/21 0857   ? ?  ? General Information  ? Behavior/Cognition Alert;Cooperative;Pleasant mood   ?  ? Treatment Provided  ? Treatment provided Cognitive-Linquistic   ?  ? Cognitive-Linquistic Treatment  ? Treatment focused on Cognition;Patient/family/caregiver education   ? Skilled Treatment [name] is recalling details from MD appointments, conversations with family with success. She reports her memory is improved. She is recalling meds independently. Targeted organization, attention to details and error awareness in schedule organization task requiring deduction. She demonstrated anticipatory awareness by re-reading clues and instructions twice before beginning and double checked first task with 1 cue and 2nd with mod I. [name] attended to details and alternated attention with rare min A. She ID'd errors with supervision cues, however 3/5 cues to self correct her errors and attend to 3/15 details.  [name] verbalized 3 accommodations she may need upon return to work with occasional min A.   ?  ? Assessment / Recommendations / Plan  ? Plan Continue with current plan of care   ?  ? Progression Toward Goals  ? Progression toward goals Progressing toward goals   ? ?  ?  ? ?  ? ? ? ? ? SLP Short Term Goals - 09/26/21 1508   ? ?  ? SLP SHORT TERM GOAL #1  ? Title Pt will use comensatory strategies for memory successfully between 3 sessions   ? Baseline 09/19/21   ? Time 1   ? Period Weeks   ? Status On-going   ? Target Date 09/28/21   ?  ? SLP SHORT TERM GOAL #2  ? Title Pt will demonstrate adeuqate organizational skills to complete mod complex therapy tasks with modified independence (compensations) in 2 sessions   ? Time 1   ? Period Weeks   ? Status On-going   ? Target Date 09/28/21   ?  ? SLP SHORT TERM GOAL #3  ? Title pt will demonstrate emergent awareness during a therapy task in order to make corrections in 3 sessions   ? Baseline 09/25/30   ? Time 1   ? Period Weeks   ? Status On-going   ? Target Date 09/28/21   ?  ? SLP SHORT TERM GOAL #4  ? Title pt  will complete cognitive testing in first 1-2 sessions   ? Time 2   ? Period --   sessions  ? Status Achieved   ? Target Date 09/14/21   ? ?  ?  ? ?  ? ? ? SLP Long Term Goals - 09/26/21 1508   ? ?  ? SLP LONG TERM GOAL #1  ? Title Pt will demonstrate adequate organizational skills to complete mod complex/complex therapy tasks with modified independence (compensations) in 4 sessions   ? Time 5   ? Period Weeks   ? Status On-going   ?  ? SLP LONG TERM GOAL #2  ? Title pt will demo anticipatory awareness by pre-planning/organizing a task prior to initiation, independently, in 3 sessions   ? Time 5   ? Period Weeks   ? Status On-going   ?  ? SLP LONG TERM GOAL #3  ? Title Pt will demonstrate adequate organizational skills to complete mod complex/complex therapy tasks with modified independence (compensations) in 4 sessions   ? Time 9   ? Period Weeks   ? Status  On-going   ?  ? SLP LONG TERM GOAL #4  ? Title pt will independently use memory strategies for work-like tasks in 5 sessions   ? Time 9   ? Period Weeks   ? Status On-going   ? ?  ?  ? ?  ? ? ? Plan - 09/26/21 1508   ? ? 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 [name] is making effort to return to IADL's and exercise. She continues to demonstrate distracablility with alternating attention tasks and ID and self correcting errors.  She hopes to return to work - Continue skilled ST to maximize cognition for safety, return to PLOF and possible return to work in some capacity   ? Speech Therapy Frequency 2x / week   ? Duration 12 weeks   ? Treatment/Interventions Compensatory techniques;Environmental controls;SLP instruction and feedback;Cueing hierarchy;Cognitive reorganization;Compensatory strategies;Patient/family education;Internal/external aids   ? Potential to Achieve Goals Good   ? ?  ?  ? ?  ? ? ?Patient will benefit from skilled therapeutic intervention in order to improve the following deficits and impairments:   ?Cognitive communication deficit ? ? ? ?Problem List ?Patient Active Problem List  ? Diagnosis Date Noted  ? Acute ischemic left PCA stroke ([place]) 08/03/2021  ? 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 Cerebral edema ([place]) 07/30/2021  ? Hyperlipidemia 07/30/2021  ? Prediabetes 07/30/2021  ? ? ?[name], Annye Rusk, CCC-SLP ?09/26/2021, 3:08 PM ? ?[facility] ?[address] ?[place], [place], [address] ?Phone: [phone]   Fax:  [phone] ? ? ?Name: [name] ?MRN: [id number] ?Date of Birth: 04-Jun-1961 ? ?

## 2021-10-01 ENCOUNTER — Ambulatory Visit: Payer: 59 | Admitting: Speech Pathology

## 2021-10-01 ENCOUNTER — Encounter: Payer: Self-pay | Admitting: Occupational Therapy

## 2021-10-01 ENCOUNTER — Encounter: Payer: Self-pay | Admitting: Speech Pathology

## 2021-10-01 ENCOUNTER — Ambulatory Visit: Payer: 59 | Admitting: Physical Therapy

## 2021-10-01 ENCOUNTER — Ambulatory Visit: Payer: 59 | Admitting: Occupational Therapy

## 2021-10-01 ENCOUNTER — Other Ambulatory Visit: Payer: Self-pay

## 2021-10-01 ENCOUNTER — Other Ambulatory Visit: Payer: Self-pay | Admitting: Physical Medicine and Rehabilitation

## 2021-10-01 DIAGNOSIS — R41842 Visuospatial deficit: Secondary | ICD-10-CM

## 2021-10-01 DIAGNOSIS — R2689 Other abnormalities of gait and mobility: Secondary | ICD-10-CM

## 2021-10-01 DIAGNOSIS — H538 Other visual disturbances: Secondary | ICD-10-CM

## 2021-10-01 DIAGNOSIS — R278 Other lack of coordination: Secondary | ICD-10-CM | POA: Diagnosis not present

## 2021-10-01 DIAGNOSIS — R41840 Attention and concentration deficit: Secondary | ICD-10-CM

## 2021-10-01 DIAGNOSIS — R2681 Unsteadiness on feet: Secondary | ICD-10-CM

## 2021-10-01 DIAGNOSIS — R41841 Cognitive communication deficit: Secondary | ICD-10-CM

## 2021-10-01 DIAGNOSIS — R262 Difficulty in walking, not elsewhere classified: Secondary | ICD-10-CM

## 2021-10-01 NOTE — Therapy (Signed)
[place] ?[address] ?[place], [place], [address] ?Phone: [phone]   Fax:  [phone] ? ?Speech Language Pathology Treatment ? ?Patient Details  ?Name: [name] ?MRN: [id number] ?Date of Birth: 1961-05-26 ?Referring Provider (SLP): Kingsley Callander., MD ? ? ?Encounter Date: 10/01/2021 ? ? End of Session - 10/01/21 1015   ? ? Visit Number 8   ? Number of Visits 25   ? Date for SLP Re-Evaluation 11/29/21   ? SLP Start Time 0848   ? SLP Stop Time  0929   ? SLP Time Calculation (min) 41 min   ? Activity Tolerance Patient tolerated treatment well   ? ?  ?  ? ?  ? ? ?Past Medical History:  ?Diagnosis Date  ? Blood in stool   ? Hyperlipidemia   ? Vaginal fibroids   ? ? ?History reviewed. No pertinent surgical history. ? ?There were no vitals filed for this visit. ? ? Subjective Assessment - 10/01/21 0853   ? ? Subjective "I called him because I am freaking out" re: injection   ? Currently in Pain? No/denies   ? ?  ?  ? ?  ? ? ? ? ? ? ? ? ADULT SLP TREATMENT - 10/01/21 0854   ? ?  ? General Information  ? Behavior/Cognition Alert;Cooperative;Pleasant mood   ?  ? Treatment Provided  ? Treatment provided Cognitive-Linquistic   ?  ? Cognitive-Linquistic Treatment  ? Treatment focused on Cognition;Patient/family/caregiver education   ? Skilled Treatment Emeline returns with meal plan for 2 weeks and plan with low salt, no processed foods. She has not smoked and continues to eat natural low sodium and drink water instead of sodas. She verbalizes strategies to self advocate and accomodations she may need if/when she returns to work. Organization, attention to detail targeted in complex time/money task. Kynsie demonstrated anticipatory awraeness asking for a scratch sheet to help keep track of costs. She required usual mod A to attend to details and to ID errors. Working memory requied consistent written or verbal cues. 3/5 correct with mod A.   ?  ? Assessment /  Recommendations / Plan  ? Plan Continue with current plan of care   ?  ? Progression Toward Goals  ? Progression toward goals Progressing toward goals   ? ?  ?  ? ?  ? ? ? ? ? SLP Short Term Goals - 10/01/21 1017   ? ?  ? SLP SHORT TERM GOAL #1  ? Title Pt will use comensatory strategies for memory successfully between 3 sessions   ? Baseline 09/19/21, 10/01/21   ? Time 1   ? Period Weeks   ? Status Achieved   ? Target Date 09/28/21   ?  ? SLP SHORT TERM GOAL #2  ? Title Pt will demonstrate adeuqate organizational skills to complete mod complex therapy tasks with modified independence (compensations) in 2 sessions   ? Baseline 10/01/21   ? Time 1   ? Period Weeks   ? Status Partially Met   ? Target Date 09/28/21   ?  ? SLP SHORT TERM GOAL #3  ? Title pt will demonstrate emergent awareness during a therapy task in order to make corrections in 3 sessions   ? Baseline 09/25/30   ? Time 1   ? Period Weeks   ? Status Achieved   ? Target Date 09/28/21   ?  ? SLP SHORT TERM GOAL #4  ? Title pt  will complete cognitive testing in first 1-2 sessions   ? Time 2   ? Period --   sessions  ? Status Achieved   ? Target Date 09/14/21   ? ?  ?  ? ?  ? ? ? SLP Long Term Goals - 10/01/21 1018   ? ?  ? SLP LONG TERM GOAL #1  ? Title Pt will demonstrate adequate organizational skills to complete mod complex/complex therapy tasks with modified independence (compensations) in 4 sessions   ? Time 4   ? Period Weeks   ? Status On-going   ?  ? SLP LONG TERM GOAL #2  ? Title pt will demo anticipatory awareness by pre-planning/organizing a task prior to initiation, independently, in 3 sessions   ? Baseline 10/01/21   ? Time 4   ? Period Weeks   ? Status On-going   ?  ? SLP LONG TERM GOAL #3  ? Title Pt will demonstrate adequate organizational skills to complete mod complex/complex therapy tasks with modified independence (compensations) in 4 sessions   ? Time 8   ? Period Weeks   ? Status On-going   ?  ? SLP LONG TERM GOAL #4  ? Title pt will  independently use memory strategies for work-like tasks in 5 sessions   ? Baseline 10/01/21   ? Time 8   ? Period Weeks   ? Status On-going   ? ?  ?  ? ?  ? ? ? Plan - 10/01/21 1016   ? ? 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 Suanne is making effort to return to IADL's and exercise. She continues to demonstrate distracablility and reduced working memory with alternating attention tasks and ID and self correcting errors.  Lesbia is verbalizing anticipatory awareness and demonstrating emergent awareness. Training in compensations and accomodations for attention, memory, and processing for possible return to work. She hopes to return to work - Continue skilled ST to maximize cognition for safety, return to PLOF and possible return to work in some capacity   ? Speech Therapy Frequency 2x / week   ? Duration 12 weeks   ? Treatment/Interventions Compensatory techniques;Environmental controls;SLP instruction and feedback;Cueing hierarchy;Cognitive reorganization;Compensatory strategies;Patient/family education;Internal/external aids   ? Potential to Achieve Goals Good   ? ?  ?  ? ?  ? ? ?Patient will benefit from skilled therapeutic intervention in order to improve the following deficits and impairments:   ?Cognitive communication deficit ? ? ? ?Problem List ?Patient Active Problem List  ? Diagnosis Date Noted  ? Acute ischemic left PCA stroke ([place]) 08/03/2021  ? 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 Cerebral edema ([place]) 07/30/2021  ? Hyperlipidemia 07/30/2021  ? Prediabetes 07/30/2021  ? ? ?Savita Runner, Annye Rusk, CCC-SLP ?10/01/2021, 12:41 PM ? ?[facility] [facility] ?[place], [place], [address] ?Phone: [phone]   Fax:  [phone] ? ? ?Name: [name] ?MRN: [id number] ?Date of Birth: 1960/09/23 ? ?

## 2021-10-01 NOTE — Therapy (Signed)
[facility] ?[place] ?[place], [place], [address] ?Phone: [phone]   Fax:  [phone] ? ?Occupational Therapy Treatment ? ?Patient Details  ?Name: [name] ?MRN: [id number] ?Date of Birth: 13-Feb-1961 ?Referring Provider (OT): Jinger Neighbors, MD ? ? ?Encounter Date: 10/01/2021 ? ? OT End of Session - 10/01/21 7867   ? ? Visit Number 9   ? Number of Visits 17   ? Date for OT Re-Evaluation 11/09/21   ? Authorization Type UHC   ? Authorization Time Period VL: 60 combined (hard max), No Auth Req'd   ? Authorization - Number of Visits 20   ? OT Start Time [phone]   ? OT Stop Time 0848   ? OT Time Calculation (min) 38 min   ? Activity Tolerance Patient tolerated treatment well   ? Behavior During Therapy [facility] for tasks assessed/performed   ? ?  ?  ? ?  ? ? ?Past Medical History:  ?Diagnosis Date  ? Blood in stool   ? Hyperlipidemia   ? Vaginal fibroids   ? ? ?History reviewed. No pertinent surgical history. ? ?There were no vitals filed for this visit. ? ? Subjective Assessment - 10/01/21 1019   ? ? 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 Pertinent History hyperlipidemia,  prediabetes and tobacco use.   ? Limitations Fall Risk   ? Currently in Pain? No/denies   ? ?  ?  ? ?  ? ? ? ? 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  ? ?Modified Multiple Elements Test:  Pt needed incr time to read directions and for therapist to review and answer questions about directions prior to starting activity.  Pt needed cueing to initiate task.  Pt demo significant difficulty with problem solving, alternating/dividing attention, and attention to detail.  Pt with incorrect order of activities, decr accuracy with card task and decr accuracy/attention to detail for catalog task, pt also went over time limit by >23mn and had to be cued to stop  task.  Long discussion regarding difficulties/mistakes.   ? ?Pt given weekly scheduling task for homework (reviewed directions and issued). ? ? ? ? ? ? OT Short Term Goals - 09/26/21 1023   ? ?  ? OT SHORT TERM GOAL #1  ? Title Pt will be independent with initial HEP   ? Time 4   ? Period Weeks   ? Status Achieved   independent with cooridnation and putty HEP 09/12/21  ? Target Date 09/28/21   ?  ? OT SHORT TERM GOAL #2  ? Title Pt will verbalize understanding of memory compensatory strategies   ? Time 4   ? Period Weeks   ? Status Achieved   using memory notebook and cell phone 09/12/21  ?  ? OT SHORT TERM GOAL #3  ? Title Pt will perform environmental scanning in moderately distracting environment with 90% accuracy or greater.   ? Time 4   ? Period Weeks   ? Status Achieved   93% accuracy 09/12/21  ?  ? OT SHORT TERM GOAL #4  ? Title Pt will increase grip strength by 5 lbs or greater in LUE.   ? Baseline L 19.6, R 35   ? Time 4   ? Period Weeks   ? Status Achieved   38.8 lbs LUE 09/12/21  ?  ?  OT SHORT TERM GOAL #5  ? Title Pt will improve 9 hole peg test by 3 seconds or more in BUE for increase in fine motor coordination and processing speed   ? Baseline R 54.37s, L 45.37s   ? Time 4   ? Period Weeks   ? Status Achieved   40.59s with LUE 09/12/21  ? ?  ?  ? ?  ? ? ? ? OT Long Term Goals - 09/26/21 1024   ? ?  ? OT LONG TERM GOAL #1  ? Title Pt will be independent with updated HEP   ? Time 10   ? Period Weeks   ? Status On-going   ? Target Date 11/09/21   ?  ? OT LONG TERM GOAL #2  ? Title Pt will increase 9 hole peg test score with BUE by 8 seconds or greater in order to increase fine motor coordination.   ? Baseline R 54.37s, L 45.37s,    3/15 - R - 41.06,  L 32.81   ? Time 10   ? Period Weeks   ? Status Achieved   ?  ? OT LONG TERM GOAL #3  ? Title Pt will be increase grip strength in LUE by 10 lbs or greater   ? Baseline L 19.6, R 35   ? Time 10   ? Period Weeks   ? Status Achieved   38.8 lbs LUE 09/12/21  ?  ? OT LONG  TERM GOAL #4  ? Title Pt will complete alternating attention task with 90% accuracy or greater and with appropriate time.   ? Baseline Trail Making B > 5 minutes with 100% accuracy   ? Time 10   ? Period Weeks   ? Status On-going   ?  ? OT LONG TERM GOAL #5  ? Title Pt will increase shoulder range of motion in LUE to 130 degrees or greater for increasing ability for overhead reach.   ? Baseline 125 degreees   ? Time 10   ? Period Weeks   ? Status Achieved   ?  ? OT LONG TERM GOAL #6  ? Title Complete FOTO at discharge with score of 70% or greater.   ? Baseline 62%   ? Time 10   ? Period Weeks   ? Status On-going   ? ?  ?  ? ?  ? ? ? ? ? ? ? ? Plan - 10/01/21 0830   ? ? Clinical Impression Statement Pt continues to progress towards goals, but continues to demo difficutly with divided attention, misses details, and has difficulty problem-solving to initiate more difficult tasks.   ? OT Occupational Profile and History Problem Focused Assessment - Including review of records relating to presenting problem   ? Occupational performance deficits (Please refer to evaluation for details): ADL's;IADL's;Leisure;Work   ? Body Structure / Function / Physical Skills ADL;Decreased knowledge of precautions;Coordination;Decreased knowledge of use of DME;Strength;GMC;IADL;ROM;FMC;UE functional use;Dexterity;Flexibility;Vision   ? Cognitive Skills Attention;Problem Solve   ? Rehab Potential Good   ? Clinical Decision Making Limited treatment options, no task modification necessary   ? Comorbidities Affecting Occupational Performance: None   ? Modification or Assistance to Complete Evaluation  No modification of tasks or assist necessary to complete eval   ? OT Frequency 2x / week   ? OT Duration Other (comment)   16 visits over 10 weeks  ? OT Treatment/Interventions Aquatic Therapy;Self-care/ADL training;Moist Heat;Fluidtherapy;DME and/or AE instruction;Therapeutic activities;Therapeutic exercise;Cognitive  remediation/compensation;Visual/perceptual remediation/compensation;Passive range  of motion;Neuromuscular education;Functional Mobility Training;Energy conservation;Patient/family education   ? Plan environmental scanning with cognitive component, alternating / divided attention tasks, cognitive re-training as related to work tasks   ? OT Home Exercise Plan FM Coordination issued 09/05/21   ? Consulted and Agree with Plan of Care Patient   ? ?  ?  ? ?  ? ? ?Patient will benefit from skilled therapeutic intervention in order to improve the following deficits and impairments:   ?Body Structure / Function / Physical Skills: ADL, Decreased knowledge of precautions, Coordination, Decreased knowledge of use of DME, Strength, GMC, IADL, ROM, FMC, UE functional use, Dexterity, Flexibility, Vision ?Cognitive Skills: Attention, Problem Solve ?  ? ? ?Visit Diagnosis: ?Attention and concentration deficit ? ?Visuospatial deficit ? ? ? ?Problem List ?Patient Active Problem List  ? Diagnosis Date Noted  ? 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 Cerebral edema ([place]) 07/30/2021  ? Hyperlipidemia 07/30/2021  ? Prediabetes 07/30/2021  ? ? Vianne Bulls, [place] ?10/01/2021, 10:44 AM ? ?[place] ?[place] ?[place], [place], [address] ?Phone: [phone]   Fax:  [phone] ? ?Name: [name] ?MRN: [id number] ?Date of Birth: [birth year] ? ?Vianne Bulls, OTR/L ?[place] ?[place], [place]  [address] ?[phone] phone ?[phone] ?10/01/21 10:44 AM ? ? ? ?

## 2021-10-01 NOTE — Therapy (Signed)
?OUTPATIENT PHYSICAL THERAPY TREATMENT NOTE ? ? ?Patient Name: [name] ?MRN: [id number] ?DOB:1961/02/20, 61 y.o., female ?Today's Date: 10/01/2021 ? ?PCP: Jearld Fenton, NP ?REFERRING PROVIDER: Izora Ribas, MD  ? ? PT End of Session - 10/01/21 0936   ? ? Visit Number 9   ? Number of Visits 17   ? Date for PT Re-Evaluation 11/23/21   pushed out due to scheduling  ? Authorization Type UHC (VL: 60 per discipline)   ? PT Start Time 3086   ? PT Stop Time 1020   ? PT Time Calculation (min) 45 min   ? Equipment Utilized During Treatment Gait belt   ? Activity Tolerance Patient tolerated treatment well   ? Behavior During Therapy [facility] for tasks assessed/performed   ? ?  ?  ? ?  ? ? ? ? ?Past Medical History:  ?Diagnosis Date  ? Blood in stool   ? Hyperlipidemia   ? Vaginal fibroids   ? ?No past surgical history on file. ?Patient Active Problem List  ? Diagnosis Date Noted  ? Acute ischemic left PCA stroke ([place]) 08/03/2021  ? 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 Cerebral edema ([place]) 07/30/2021  ? Hyperlipidemia 07/30/2021  ? Prediabetes 07/30/2021  ? ? ?REFERRING DIAG: I63.50 (ICD-10-CM) - Cerebral artery occlusion with cerebral infarction ([place])  ? ?THERAPY DIAG:  ?Other abnormalities of gait and mobility ? ?Difficulty in walking, not elsewhere classified ? ?Unsteadiness on feet ? ?PERTINENT HISTORY: HLD, Prediabetes ? ?PRECAUTIONS: Fall ? ?SUBJECTIVE: "I am doing better today". Pt reports she took a nap for the first time in a long time and "felt so much better". Pt reports she has been driving and "my boyfriend was panicking the entire time". Has her progressive lenses and encouraged her to wear them during session.   ? ?PAIN:  ?Are you having pain? No ? ? ? ?TODAY'S TREATMENT:  ? ?Self-care/home management  ?Lengthy discussion regarding pt being unsafe to drive. Contacted Dr. Ranell Patrick for further clarification, who informed therapist that pt needed to make appointment w/neuro-opthalmology to be cleared to  drive. Pt provided w/phone number for ophthalmologist and encouraged to call today, as she has referral in system already.  ? ?  NMR ?-Hurdle navigation for BLE coordination, bilat hip flexor strength and single leg stability. Pt performed x4 reps of lateral step overs using 4 6" hurdles, CGA throughout. Progressed to step-to fwd navigation x4 and step-though fwd navigation x4. Noted significant difficulty w/proper placement of LLE w/step-through pattern, as pt demonstrated scissoring pattern. Min verbal cues for proper placement of feet.  ?-Staggered stance RDLS, x4 per side V/78# KB for improved hamstring strength and narrow BOS. Min visual cues provided for proper form, CGA throughout. Noted increased difficulty w/LLE forward compared to RLE.  ? ? ? ?PATIENT EDUCATION: ?Education details: Safety concerns with pt driving, making appointment for ophthalmology screen   ?Person educated: Patient ?Education method: Explanation and Demonstration ?Education comprehension: verbalized understanding ? ? ?HOME EXERCISE PROGRAM: ?Access Code: [email or web address] ? ? PT Short Term Goals  ? ?  ? PT SHORT TERM GOAL #1  ? Title Pt will be indepdent with initial HEP for balance/strength   ? Baseline no HEP established   ? Time 4   ? Period Weeks   ? Status Achieved  ? Target Date 09/28/21   ?  ? PT SHORT TERM GOAL #2  ? Title Pt will improve TUG to </= 16 seconds with LRAD to demonstrate reduced fall risk   ?  Baseline 22.72 secs with RW; 11.06 secs no AD  ? Time 4   ? Period Weeks   ? Status Achieved   ?  ? PT SHORT TERM GOAL #3  ? Title Patient will improve gait speed to >/= 2.0 ft/sec w/ LRAD to demo improved community mobility   ? Baseline 1.90 ft/sec; 2.35 ft/sec  ? Time 4   ? Period Weeks   ? Status Achieved  ?  ? PT SHORT TERM GOAL #4  ? Title Pt will improve Berg score to >42/56 for reduced risk of falls   ? Baseline 35/56; 51/56  ? Time 4   ? Period Weeks   ? Status Achieved  ?  ? PT SHORT TERM GOAL #5  ? Title Pt will be able to  ambulate >/= 300 ft w/ LRAD and supervision to demo improved household/community mobility   ? Baseline 120' CGA; 600' without AD with distant S* on 09/26/21   ? Time 4   ? Period Weeks   ? Status Achieved   ? ?  ?  ? ?  ? ? ? PT Long Term Goals  ? ?  ? PT LONG TERM GOAL #1  ? Title Pt will be independent with final HEP for balance/strength   ? Baseline no HEP established   ? Time 8   ? Period Weeks   ? Status New   ? Target Date 10/26/21   ?  ? PT LONG TERM GOAL #2  ? Title Pt will perform >54/56 on Berg for improved dynamic balance and reduced fall risk   ? Baseline 35/56   ? Time 8   ? Period Weeks   ? Status Revised   ?  ? PT LONG TERM GOAL #3  ? Title Pt will improve TUG to </= 10 seconds w/ LRAD to demo improved balance   ? Baseline 22.72 secs w/ RW   ? Time 8   ? Period Weeks   ? Status New   ?  ? PT LONG TERM GOAL #4  ? Title Pt will improve 5x sit <> stand to </= 12 seconds to demo improved balance   ? Baseline 14.22 secs   ? Time 8   ? Period Weeks   ? Status New   ?  ? PT LONG TERM GOAL #5  ? Title Pt will improve gait speed to >/= 2.6 ft/sec to demo improved community ambulator   ? Baseline 1.90 ft/sec   ? Time 8   ? Period Weeks   ? Status New   ?  ? PT LONG TERM GOAL #6  ? Title Pt will be able to ascend/descend 8 stairs with reciprocal pattern and supervision with use of single rail vs. no rail   ? Baseline step to pattern bil rails   ? Time 8   ? Period Weeks   ? Status New   ?  ? PT LONG TERM GOAL #7  ? Title Pt will improve FOTO to >/= 62%   ? Baseline 49%   ? Time 8   ? Period Weeks   ? Status New   ? ?  ?  ? ?  ? ? ? Plan  ? ? Clinical Impression Statement Emphasis of skilled PT session on single leg stability, obstacle navigation and educating pt on safety concerns with driving. Pt under impression she could drive due to obtaining handicap decal, so majority of session spent encouraging pt to make appointment with ophthalmology to  be cleared to drive. Pt continues to demonstrate LLE weakness and  difficulty w/BLE coordination tasks. Continue POC.   ? Personal Factors and Comorbidities Comorbidity 2;Transportation   ? Comorbidities HLD, Prediabetes   ? Examination-Activity Limitations Transfers;Stairs;Stand;Locomotion Level;Squat;Bend   ? Examination-Participation Restrictions Occupation;Cleaning;Community Activity;Driving   ? Stability/Clinical Decision Making Stable/Uncomplicated   ? Rehab Potential Good   ? PT Frequency 2x / week   ? PT Duration 8 weeks   plus eval  ? PT Treatment/Interventions ADLs/Self Care Home Management;Aquatic Therapy;Canalith Repostioning;Cryotherapy;Electrical Stimulation;Moist Heat;DME Instruction;Gait training;Stair training;Functional mobility training;Therapeutic activities;Therapeutic exercise;Balance training;Neuromuscular re-education;Patient/family education;Orthotic Fit/Training;Manual techniques;Passive range of motion;Vestibular;Energy conservation   ? PT Next Visit Plan Continue gait without AD, stairs and curb negotiation. Update HEP as needed, high-level balance, single leg stance, BLE strength, BLE coordination tasks    ? PT Home Exercise Plan [email or web address]   ? Consulted and Agree with Plan of Care Patient   ? ?  ?  ? ?  ? ? ? ? ?Cruzita Lederer Kala Ambriz, PT, DPT ?10/01/2021, 11:29 AM ? ?  ? ?

## 2021-10-03 ENCOUNTER — Ambulatory Visit: Payer: 59 | Admitting: Physical Therapy

## 2021-10-03 ENCOUNTER — Ambulatory Visit: Payer: 59 | Admitting: Speech Pathology

## 2021-10-03 ENCOUNTER — Ambulatory Visit: Payer: 59 | Admitting: Occupational Therapy

## 2021-10-03 ENCOUNTER — Encounter: Payer: Self-pay | Admitting: Speech Pathology

## 2021-10-03 ENCOUNTER — Encounter: Payer: Self-pay | Admitting: Occupational Therapy

## 2021-10-03 ENCOUNTER — Other Ambulatory Visit: Payer: Self-pay

## 2021-10-03 DIAGNOSIS — R41842 Visuospatial deficit: Secondary | ICD-10-CM

## 2021-10-03 DIAGNOSIS — R41841 Cognitive communication deficit: Secondary | ICD-10-CM

## 2021-10-03 DIAGNOSIS — M6281 Muscle weakness (generalized): Secondary | ICD-10-CM

## 2021-10-03 DIAGNOSIS — R278 Other lack of coordination: Secondary | ICD-10-CM | POA: Diagnosis not present

## 2021-10-03 DIAGNOSIS — R2681 Unsteadiness on feet: Secondary | ICD-10-CM

## 2021-10-03 DIAGNOSIS — I69354 Hemiplegia and hemiparesis following cerebral infarction affecting left non-dominant side: Secondary | ICD-10-CM

## 2021-10-03 DIAGNOSIS — R41840 Attention and concentration deficit: Secondary | ICD-10-CM

## 2021-10-03 DIAGNOSIS — R2689 Other abnormalities of gait and mobility: Secondary | ICD-10-CM

## 2021-10-03 NOTE — Therapy (Signed)
[place] ?[place] ?[place], [place], [address] ?Phone: [phone]   Fax:  [phone] ? ?Occupational Therapy Treatment ? ?Patient Details  ?Name: [name] ?MRN: [id number] ?Date of Birth: Jan 28, 1961 ?Referring Provider (OT): Jinger Neighbors, MD ? ? ?Encounter Date: 10/03/2021 ? ? OT End of Session - 10/03/21 0850   ? ? Visit Number 10   ? Number of Visits 17   ? Date for OT Re-Evaluation 11/09/21   ? Authorization Type UHC   ? Authorization Time Period VL: 60 combined (hard max), No Auth Req'd   ? Authorization - Number of Visits 20   ? OT Start Time 0848   ? OT Stop Time 0930   ? OT Time Calculation (min) 42 min   ? Activity Tolerance Patient tolerated treatment well   ? Behavior During Therapy [facility] for tasks assessed/performed   ? ?  ?  ? ?  ? ? ?Past Medical History:  ?Diagnosis Date  ? Blood in stool   ? Hyperlipidemia   ? Vaginal fibroids   ? ? ?History reviewed. No pertinent surgical history. ? ?There were no vitals filed for this visit. ? ? Subjective Assessment - 10/03/21 0849   ? ? Subjective  Pt denies any changes and any pain.   ? Pertinent History hyperlipidemia,  prediabetes and tobacco use.   ? Limitations Fall Risk   ? Currently in Pain? No/denies   ? Pain Score 0-No pain   ? ?  ?  ? ?  ? ? ? ? ?Deductive Reasoning puzzle (store owners) - pt req'd mod cues for solving puzzle and increased time. Pt with increased difficulty with deductive reasoning with puzzle in order to determine answers without it being concrete. Pt would also look to therapist for verification for right answers throughout puzzle with self limiting/doubt.  ? ?36 pc puzzle (bottle caps) with increased time req'd and good attention to detail and sequencing with completing outside pieces first and then middle without cueing.  ? ? ? ? ? ? ? ? ? ? ? ? ? ? ? ? ? ? ? ? OT Short Term Goals - 09/26/21 1023   ? ?  ? OT SHORT TERM GOAL #1  ? Title Pt will be independent  with initial HEP   ? Time 4   ? Period Weeks   ? Status Achieved   independent with cooridnation and putty HEP 09/12/21  ? Target Date 09/28/21   ?  ? OT SHORT TERM GOAL #2  ? Title Pt will verbalize understanding of memory compensatory strategies   ? Time 4   ? Period Weeks   ? Status Achieved   using memory notebook and cell phone 09/12/21  ?  ? OT SHORT TERM GOAL #3  ? Title Pt will perform environmental scanning in moderately distracting environment with 90% accuracy or greater.   ? Time 4   ? Period Weeks   ? Status Achieved   93% accuracy 09/12/21  ?  ? OT SHORT TERM GOAL #4  ? Title Pt will increase grip strength by 5 lbs or greater in LUE.   ? Baseline L 19.6, R 35   ? Time 4   ? Period Weeks   ? Status Achieved   38.8 lbs LUE 09/12/21  ?  ? OT SHORT TERM GOAL #5  ? Title Pt will improve 9 hole peg test by 3 seconds or more in BUE for increase in fine motor  coordination and processing speed   ? Baseline R 54.37s, L 45.37s   ? Time 4   ? Period Weeks   ? Status Achieved   40.59s with LUE 09/12/21  ? ?  ?  ? ?  ? ? ? ? OT Long Term Goals - 09/26/21 1024   ? ?  ? OT LONG TERM GOAL #1  ? Title Pt will be independent with updated HEP   ? Time 10   ? Period Weeks   ? Status On-going   ? Target Date 11/09/21   ?  ? OT LONG TERM GOAL #2  ? Title Pt will increase 9 hole peg test score with BUE by 8 seconds or greater in order to increase fine motor coordination.   ? Baseline R 54.37s, L 45.37s,    3/15 - R - 41.06,  L 32.81   ? Time 10   ? Period Weeks   ? Status Achieved   ?  ? OT LONG TERM GOAL #3  ? Title Pt will be increase grip strength in LUE by 10 lbs or greater   ? Baseline L 19.6, R 35   ? Time 10   ? Period Weeks   ? Status Achieved   38.8 lbs LUE 09/12/21  ?  ? OT LONG TERM GOAL #4  ? Title Pt will complete alternating attention task with 90% accuracy or greater and with appropriate time.   ? Baseline Trail Making B > 5 minutes with 100% accuracy   ? Time 10   ? Period Weeks   ? Status On-going   ?  ? OT LONG TERM  GOAL #5  ? Title Pt will increase shoulder range of motion in LUE to 130 degrees or greater for increasing ability for overhead reach.   ? Baseline 125 degreees   ? Time 10   ? Period Weeks   ? Status Achieved   ?  ? OT LONG TERM GOAL #6  ? Title Complete FOTO at discharge with score of 70% or greater.   ? Baseline 62%   ? Time 10   ? Period Weeks   ? Status On-going   ? ?  ?  ? ?  ? ? ? ? ? ? ? ? Plan - 10/03/21 0855   ? ? Clinical Impression Statement Pt reports therapy is helping and demonstrates much improvement. Pt continues to have difficulty with higher level cognitive tasks and problem solving and alternating attention.   ? OT Occupational Profile and History Problem Focused Assessment - Including review of records relating to presenting problem   ? Occupational performance deficits (Please refer to evaluation for details): ADL's;IADL's;Leisure;Work   ? Body Structure / Function / Physical Skills ADL;Decreased knowledge of precautions;Coordination;Decreased knowledge of use of DME;Strength;GMC;IADL;ROM;FMC;UE functional use;Dexterity;Flexibility;Vision   ? Cognitive Skills Attention;Problem Solve   ? Rehab Potential Good   ? Clinical Decision Making Limited treatment options, no task modification necessary   ? Comorbidities Affecting Occupational Performance: None   ? Modification or Assistance to Complete Evaluation  No modification of tasks or assist necessary to complete eval   ? OT Frequency 2x / week   ? OT Duration Other (comment)   16 visits over 10 weeks  ? OT Treatment/Interventions Aquatic Therapy;Self-care/ADL training;Moist Heat;Fluidtherapy;DME and/or AE instruction;Therapeutic activities;Therapeutic exercise;Cognitive remediation/compensation;Visual/perceptual remediation/compensation;Passive range of motion;Neuromuscular education;Functional Mobility Training;Energy conservation;Patient/family education   ? Plan environmental scanning with cognitive component, alternating / divided attention  tasks, cognitive re-training as related to work  tasks   ? OT Home Exercise Plan FM Coordination issued 09/05/21   ? Consulted and Agree with Plan of Care Patient   ? ?  ?  ? ?  ? ? ?Patient will benefit from skilled therapeutic intervention in order to improve the following deficits and impairments:   ?Body Structure / Function / Physical Skills: ADL, Decreased knowledge of precautions, Coordination, Decreased knowledge of use of DME, Strength, GMC, IADL, ROM, FMC, UE functional use, Dexterity, Flexibility, Vision ?Cognitive Skills: Attention, Problem Solve ?  ? ? ?Visit Diagnosis: ?Unsteadiness on feet ? ?Other lack of coordination ? ?Muscle weakness (generalized) ? ?Hemiplegia and hemiparesis following cerebral infarction affecting left non-dominant side ([place]) ? ?Attention and concentration deficit ? ?Visuospatial deficit ? ? ? ?Problem List ?Patient Active Problem List  ? Diagnosis Date Noted  ? Acute ischemic left PCA stroke ([place]) 08/03/2021  ? 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 Cerebral edema ([place]) 07/30/2021  ? Hyperlipidemia 07/30/2021  ? Prediabetes 07/30/2021  ? ? ?Zachery Conch, OT ?10/03/2021, 9:59 AM ? ?[place] ?[place] ?[place], [place], [address] ?Phone: [phone]   Fax:  [phone] ? ?Name: [name] ?MRN: [id number] ?Date of Birth: 19-Dec-1960 ? ?

## 2021-10-03 NOTE — Therapy (Signed)
?OUTPATIENT PHYSICAL THERAPY TREATMENT NOTE ? ? ?Patient Name: [name] ?MRN: [id number] ?DOB:[birth year], 61 y.o., female ?Today's Date: 10/03/2021 ? ?PCP: Jearld Fenton, NP ?REFERRING PROVIDER: Izora Ribas, MD  ? ? PT End of Session - 10/03/21 0848   ? ? Visit Number 10   ? Number of Visits 17   ? Date for PT Re-Evaluation 11/23/21   pushed out due to scheduling  ? Authorization Type UHC (VL: 60 per discipline)   ? PT Start Time [phone]   Pt arrived late  ? PT Stop Time 2725   ? PT Time Calculation (min) 40 min   ? Activity Tolerance Patient tolerated treatment well   ? Behavior During Therapy [facility] for tasks assessed/performed   ? ?  ?  ? ?  ? ? ? ? ? ?Past Medical History:  ?Diagnosis Date  ? Blood in stool   ? Hyperlipidemia   ? Vaginal fibroids   ? ?No past surgical history on file. ?Patient Active Problem List  ? Diagnosis Date Noted  ? 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 Cerebral edema ([place]) 07/30/2021  ? Hyperlipidemia 07/30/2021  ? Prediabetes 07/30/2021  ? ? ?REFERRING DIAG: I63.50 (ICD-10-CM) - Cerebral artery occlusion with cerebral infarction ([place])  ? ?THERAPY DIAG:  ?Unsteadiness on feet ? ?Muscle weakness (generalized) ? ?Other abnormalities of gait and mobility ? ?PERTINENT HISTORY: HLD, Prediabetes ? ?PRECAUTIONS: Fall ? ?SUBJECTIVE: "I did not sleep at all last night". Reports spitting out blood this morning after brushing her teeth. ? ? ?PAIN:  ?Are you having pain? No ? ?VITALS: All taken in LUE while seated ?BP pre-session: 136/71 mmHg  ?BP after SciFit: 138/50 mmHg ? ?TODAY'S TREATMENT:  ?   ?  Ther Ex  ?SciFit level 5 for 6 minutes w/BUE/BLEs for dynamic cardiovascular warmup and endurance. Min verbal cues to maintain steps/min >95  ?   ?  NMR ?-Alt. Step ups w/contralateral march and 3s isometric hold to 6" step with intermittent UE support and CGA, x20 per side. Pt demonstrated improved confidence with stepping w/ LLE and  improved single leg stability at top of step. Min verbal cues for increased velocity with marches ?-Forward soccer dribbling [phone]' w/BLEs for BLE coordination, anticipatory stepping and visual tracking. CGA-min A throughout for steadying assist. ?-Retro soccer dribbling x115' w/BLEs, CGA throughout. Pt able to increase/decrease speed appropriately to maintain soccer ball close to body without verbal cues. No posterior LOB noted  ? ? ?PATIENT EDUCATION: ?Education details: Continuation of walking program, increasing cardiovascular challenge at home, bringing glasses to ophthalmologist appointment next week  ?Person educated: Patient ?Education method: Explanation and Demonstration ?Education comprehension: verbalized understanding ? ? ?HOME EXERCISE PROGRAM: ?Access Code: [email or web address] ? ? PT Short Term Goals  ? ?  ? PT SHORT TERM GOAL #1  ? Title Pt will be indepdent with initial HEP for balance/strength   ? Baseline no HEP established   ? Time 4   ? Period Weeks   ? Status Achieved  ? Target Date 09/28/21   ?  ? PT SHORT TERM GOAL #2  ? Title Pt will improve TUG to </= 16 seconds with LRAD to demonstrate reduced fall risk   ? Baseline 22.72 secs with RW; 11.06 secs no AD  ? Time 4   ? Period Weeks   ? Status Achieved   ?  ? PT SHORT TERM GOAL #3  ? Title Patient will improve gait  speed to >/= 2.0 ft/sec w/ LRAD to demo improved community mobility   ? Baseline 1.90 ft/sec; 2.35 ft/sec  ? Time 4   ? Period Weeks   ? Status Achieved  ?  ? PT SHORT TERM GOAL #4  ? Title Pt will improve Berg score to >42/56 for reduced risk of falls   ? Baseline 35/56; 51/56  ? Time 4   ? Period Weeks   ? Status Achieved  ?  ? PT SHORT TERM GOAL #5  ? Title Pt will be able to ambulate >/= 300 ft w/ LRAD and supervision to demo improved household/community mobility   ? Baseline 120' CGA; 600' without AD with distant S* on 09/26/21   ? Time 4   ? Period Weeks   ? Status Achieved   ? ?  ?  ? ?  ? ? ? PT Long Term Goals  ? ?  ? PT LONG TERM GOAL  #1  ? Title Pt will be independent with final HEP for balance/strength   ? Baseline no HEP established   ? Time 8   ? Period Weeks   ? Status New   ? Target Date 10/26/21   ?  ? PT LONG TERM GOAL #2  ? Title Pt will perform >54/56 on Berg for improved dynamic balance and reduced fall risk   ? Baseline 35/56   ? Time 8   ? Period Weeks   ? Status Revised   ?  ? PT LONG TERM GOAL #3  ? Title Pt will improve TUG to </= 10 seconds w/ LRAD to demo improved balance   ? Baseline 22.72 secs w/ RW   ? Time 8   ? Period Weeks   ? Status New   ?  ? PT LONG TERM GOAL #4  ? Title Pt will improve 5x sit <> stand to </= 12 seconds to demo improved balance   ? Baseline 14.22 secs   ? Time 8   ? Period Weeks   ? Status New   ?  ? PT LONG TERM GOAL #5  ? Title Pt will improve gait speed to >/= 2.6 ft/sec to demo improved community ambulator   ? Baseline 1.90 ft/sec   ? Time 8   ? Period Weeks   ? Status New   ?  ? PT LONG TERM GOAL #6  ? Title Pt will be able to ascend/descend 8 stairs with reciprocal pattern and supervision with use of single rail vs. no rail   ? Baseline step to pattern bil rails   ? Time 8   ? Period Weeks   ? Status New   ?  ? PT LONG TERM GOAL #7  ? Title Pt will improve FOTO to >/= 62%   ? Baseline 49%   ? Time 8   ? Period Weeks   ? Status New   ? ?  ?  ? ?  ? ? ? Plan  ? ? Clinical Impression Statement Emphasis of skilled PT session on single leg stability, BLE coordination and dynamic walking balance. Pt significantly more confident in step navigation today and demonstrated improved stability while standing on LLE. Pt continues to be limited by impaired peripheral vision and fear-avoidance behavior. Continue POC.   ? Personal Factors and Comorbidities Comorbidity 2;Transportation   ? Comorbidities HLD, Prediabetes   ? Examination-Activity Limitations Transfers;Stairs;Stand;Locomotion Level;Squat;Bend   ? Examination-Participation Restrictions Occupation;Cleaning;Community Activity;Driving   ?  Stability/Clinical Decision Making Stable/Uncomplicated   ?  Rehab Potential Good   ? PT Frequency 2x / week   ? PT Duration 8 weeks   plus eval  ? PT Treatment/Interventions ADLs/Self Care Home Management;Aquatic Therapy;Canalith Repostioning;Cryotherapy;Electrical Stimulation;Moist Heat;DME Instruction;Gait training;Stair training;Functional mobility training;Therapeutic activities;Therapeutic exercise;Balance training;Neuromuscular re-education;Patient/family education;Orthotic Fit/Training;Manual techniques;Passive range of motion;Vestibular;Energy conservation   ? PT Next Visit Plan Continue gait without AD, stairs and curb negotiation. Update HEP as needed, high-level balance, single leg stance, BLE strength, BLE coordination tasks , wall sits    ? PT Home Exercise Plan [email or web address]   ? Consulted and Agree with Plan of Care Patient   ? ?  ?  ? ?  ? ? ? ? ?Cruzita Lederer Myking Sar, PT, DPT ?10/03/2021, 9:00 AM ? ?  ? ?

## 2021-10-03 NOTE — Therapy (Signed)
[facility] ?[place] ?[place], [place], [address] ?Phone: [phone]   Fax:  [phone] ? ?Speech Language Pathology Treatment ? ?Patient Details  ?Name: [name] ?MRN: [id number] ?Date of Birth: Mar 15, 1961 ?Referring Provider (SLP): Kingsley Callander., MD ? ? ?Encounter Date: 10/03/2021 ? ? End of Session - 10/03/21 1017   ? ? Visit Number 9   ? Number of Visits 25   ? Date for SLP Re-Evaluation 11/29/21   ? SLP Start Time 0930   ? SLP Stop Time  1014   ? SLP Time Calculation (min) 44 min   ? Activity Tolerance Patient tolerated treatment well   ? ?  ?  ? ?  ? ? ?Past Medical History:  ?Diagnosis Date  ? Blood in stool   ? Hyperlipidemia   ? Vaginal fibroids   ? ? ?History reviewed. No pertinent surgical history. ? ?There were no vitals filed for this visit. ? ? Subjective Assessment - 10/03/21 1018   ? ? Subjective "This looked familiar" re: HW   ? Currently in Pain? No/denies   ? ?  ?  ? ?  ? ? ? ? ? ? ? ? ADULT SLP TREATMENT - 10/03/21 0916   ? ?  ? General Information  ? Behavior/Cognition Alert;Cooperative;Pleasant mood   ?  ? Treatment Provided  ? Treatment provided Cognitive-Linquistic   ?  ? Cognitive-Linquistic Treatment  ? Treatment focused on Cognition;Patient/family/caregiver education   ? Skilled Treatment [name] verbalized "I didn't pay attentoin" indicating emergent awraeness. She continues to use grocery list on her fridge to recall groceries with success. Mildly complex mental math task required occasional mod A to organize problems and consistent max A for working memory to hold 1-2 simple numbers to complete simple mental math.Due to difficulty level, alternating attention not completed today.   ?  ? Assessment / Recommendations / Plan  ? Plan Continue with current plan of care   ?  ? Progression Toward Goals  ? Progression toward goals Progressing toward goals   ? ?  ?  ? ?  ? ? ? ? ? SLP Short Term Goals - 10/03/21 1019   ? ?  ? SLP  SHORT TERM GOAL #1  ? Title Pt will use comensatory strategies for memory successfully between 3 sessions   ? Baseline 09/19/21, 10/01/21   ? Time 1   ? Period Weeks   ? Status Achieved   ? Target Date 09/28/21   ?  ? SLP SHORT TERM GOAL #2  ? Title Pt will demonstrate adeuqate organizational skills to complete mod complex therapy tasks with modified independence (compensations) in 2 sessions   ? Baseline 10/01/21   ? Time 1   ? Period Weeks   ? Status Partially Met   ? Target Date 09/28/21   ?  ? SLP SHORT TERM GOAL #3  ? Title pt will demonstrate emergent awareness during a therapy task in order to make corrections in 3 sessions   ? Baseline 09/25/30   ? Time 1   ? Period Weeks   ? Status Achieved   ? Target Date 09/28/21   ?  ? SLP SHORT TERM GOAL #4  ? Title pt will complete cognitive testing in first 1-2 sessions   ? Time 2   ? Period --   sessions  ? Status Achieved   ? Target Date 09/14/21   ? ?  ?  ? ?  ? ? ?  SLP Long Term Goals - 10/03/21 1019   ? ?  ? SLP LONG TERM GOAL #1  ? Title Pt will demonstrate adequate organizational skills to complete mod complex/complex therapy tasks with modified independence (compensations) in 4 sessions   ? Time 4   ? Period Weeks   ? Status On-going   ?  ? SLP LONG TERM GOAL #2  ? Title pt will demo anticipatory awareness by pre-planning/organizing a task prior to initiation, independently, in 3 sessions   ? Baseline 10/01/21   ? Time 4   ? Period Weeks   ? Status On-going   ?  ? SLP LONG TERM GOAL #3  ? Title Pt will demonstrate adequate organizational skills to complete mod complex/complex therapy tasks with modified independence (compensations) in 4 sessions   ? Time 8   ? Period Weeks   ? Status On-going   ?  ? SLP LONG TERM GOAL #4  ? Title pt will independently use memory strategies for work-like tasks in 5 sessions   ? Baseline 10/01/21   ? Time 8   ? Period Weeks   ? Status On-going   ? ?  ?  ? ?  ? ? ? Plan - 10/03/21 1018   ? ? 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 [name] is making effort to return to IADL's and exercise. She continues to demonstrate distracablility and reduced working memory with alternating attention tasks and ID and self correcting errors.  [name] is verbalizing anticipatory awareness and demonstrating emergent awareness. Training in compensations and accomodations for attention, memory, and processing for possible return to work. She hopes to return to work - Continue skilled ST to maximize cognition for safety, return to PLOF and possible return to work in some capacity   ? Speech Therapy Frequency 2x / week   ? Duration 12 weeks   ? Treatment/Interventions Compensatory techniques;Environmental controls;SLP instruction and feedback;Cueing hierarchy;Cognitive reorganization;Compensatory strategies;Patient/family education;Internal/external aids   ? Potential to Achieve Goals Good   ? ?  ?  ? ?  ? ? ?Patient will benefit from skilled therapeutic intervention in order to improve the following deficits and impairments:   ?Cognitive communication deficit ? ? ? ?Problem List ?Patient Active Problem List  ? Diagnosis Date Noted  ? Acute ischemic left PCA stroke ([place]) 08/03/2021  ? 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 Cerebral edema ([place]) 07/30/2021  ? Hyperlipidemia 07/30/2021  ? Prediabetes 07/30/2021  ? ? ?[name], [name], [name] ?10/03/2021, 3:09 PM ? ?[facility] ?[address] ?[place], [place], [address] ?Phone: [phone]   Fax:  [phone] ? ? ?Name: [name] ?MRN: [id number] ?Date of Birth: [birth year] ? ?

## 2021-10-08 ENCOUNTER — Encounter: Payer: Self-pay | Admitting: Physical Therapy

## 2021-10-08 ENCOUNTER — Encounter: Payer: Self-pay | Admitting: Occupational Therapy

## 2021-10-08 ENCOUNTER — Encounter: Payer: Self-pay | Admitting: Speech Pathology

## 2021-10-08 ENCOUNTER — Ambulatory Visit: Payer: 59 | Admitting: Speech Pathology

## 2021-10-08 ENCOUNTER — Other Ambulatory Visit: Payer: Self-pay

## 2021-10-08 ENCOUNTER — Ambulatory Visit: Payer: 59 | Admitting: Physical Therapy

## 2021-10-08 ENCOUNTER — Ambulatory Visit: Payer: 59 | Admitting: Occupational Therapy

## 2021-10-08 VITALS — BP 123/72 | HR 62

## 2021-10-08 DIAGNOSIS — M6281 Muscle weakness (generalized): Secondary | ICD-10-CM

## 2021-10-08 DIAGNOSIS — R2681 Unsteadiness on feet: Secondary | ICD-10-CM

## 2021-10-08 DIAGNOSIS — R41840 Attention and concentration deficit: Secondary | ICD-10-CM

## 2021-10-08 DIAGNOSIS — R262 Difficulty in walking, not elsewhere classified: Secondary | ICD-10-CM

## 2021-10-08 DIAGNOSIS — R41841 Cognitive communication deficit: Secondary | ICD-10-CM

## 2021-10-08 DIAGNOSIS — R41842 Visuospatial deficit: Secondary | ICD-10-CM

## 2021-10-08 DIAGNOSIS — R278 Other lack of coordination: Secondary | ICD-10-CM | POA: Diagnosis not present

## 2021-10-08 NOTE — Patient Instructions (Signed)
Access Code: [email or web address] ?URL: [email or web address] ?Date: 10/08/2021 ?Prepared by: Janann August ? ?Exercises ?- Heel to toe walking   - 1 x daily - 7 x weekly - 3 sets - 3 reps ?- Backward heel to toe walking   - 1 x daily - 7 x weekly - 3 sets - 3 reps ?- Mini Squats with Walker and Chair  - 1 x daily - 7 x weekly - 2 sets - 10 reps ?- Standing Marching  - 1-2 x daily - 5 x weekly - 3 sets ?- Romberg Stance Eyes Closed on Foam Pad  - 1-2 x daily - 5 x weekly - 3 sets - 30 hold ?- Side Stepping with Resistance at Thighs  - 1-2 x daily - 5 x weekly - 3 sets ?

## 2021-10-08 NOTE — Patient Instructions (Signed)
? ?  Look up Smoothie recipes ? ?Frozen spinach or kale ? ?Whey protien powder (Vanilla) ? ?Greek yogurt or Kefir  ? ?Review your list of accommodations for work  ? ?Get back to reading a little each day, when you can ?

## 2021-10-08 NOTE — Therapy (Signed)
[facility] ?[place] ?[place], [place], [address] ?Phone: [phone]   Fax:  [phone] ? ?Occupational Therapy Treatment ? ?Patient Details  ?Name: [name] ?MRN: [id number] ?Date of Birth: [birth year] ?Referring Provider (OT): Jinger Neighbors, MD ? ? ?Encounter Date: 10/08/2021 ? ? OT End of Session - 10/08/21 0924   ? ? Visit Number 11   ? Number of Visits 17   ? Date for OT Re-Evaluation 11/09/21   ? Authorization Type UHC   ? Authorization Time Period VL: 60 combined (hard max), No Auth Req'd   ? Authorization - Visit Number 11   ? Authorization - Number of Visits 20   ? OT Start Time [phone]   ? OT Stop Time 1016   ? OT Time Calculation (min) 42 min   ? Activity Tolerance Patient tolerated treatment well   ? Behavior During Therapy [facility] for tasks assessed/performed   ? ?  ?  ? ?  ? ? ?Past Medical History:  ?Diagnosis Date  ? Blood in stool   ? Hyperlipidemia   ? Vaginal fibroids   ? ? ?History reviewed. No pertinent surgical history. ? ?There were no vitals filed for this visit. ? ? Subjective Assessment - 10/08/21 0924   ? ? Subjective  Pt denies any changes and any pain.   ? Pertinent History hyperlipidemia,  prediabetes and tobacco use.   ? Limitations Fall Risk   ? Currently in Pain? No/denies   ? ?  ?  ? ?  ? ? ?Activities performed in busy gym: ? ?Organizing items on shopping list by categories to make shopping easier with incr time/good accuracy. ? ?Functional problem-solving/planning related to time with incr time and 6/10 accurate with remaining 4 partially correct (decr attention to detail) ? ?Constant Therapy--Alternating Symbol Match, level 6 with 94% accuracy with 42.89 sec/average response time.   ? ?Logic Links (green) for mod complex problem solving with min-mod difficulty/cues. ? ? ? ? ? ? ? OT Short Term Goals - 09/26/21 1023   ? ?  ? OT SHORT TERM GOAL #1  ? Title Pt will be independent with initial HEP   ? Time 4   ? Period  Weeks   ? Status Achieved   independent with cooridnation and putty HEP 09/12/21  ? Target Date 09/28/21   ?  ? OT SHORT TERM GOAL #2  ? Title Pt will verbalize understanding of memory compensatory strategies   ? Time 4   ? Period Weeks   ? Status Achieved   using memory notebook and cell phone 09/12/21  ?  ? OT SHORT TERM GOAL #3  ? Title Pt will perform environmental scanning in moderately distracting environment with 90% accuracy or greater.   ? Time 4   ? Period Weeks   ? Status Achieved   93% accuracy 09/12/21  ?  ? OT SHORT TERM GOAL #4  ? Title Pt will increase grip strength by 5 lbs or greater in LUE.   ? Baseline L 19.6, R 35   ? Time 4   ? Period Weeks   ? Status Achieved   38.8 lbs LUE 09/12/21  ?  ? OT SHORT TERM GOAL #5  ? Title Pt will improve 9 hole peg test by 3 seconds or more in BUE for increase in fine motor coordination and processing speed   ? Baseline R 54.37s, L 45.37s   ? Time 4   ? Period Weeks   ?  Status Achieved   40.59s with LUE 09/12/21  ? ?  ?  ? ?  ? ? ? ? OT Long Term Goals - 09/26/21 1024   ? ?  ? OT LONG TERM GOAL #1  ? Title Pt will be independent with updated HEP   ? Time 10   ? Period Weeks   ? Status On-going   ? Target Date 11/09/21   ?  ? OT LONG TERM GOAL #2  ? Title Pt will increase 9 hole peg test score with BUE by 8 seconds or greater in order to increase fine motor coordination.   ? Baseline R 54.37s, L 45.37s,    3/15 - R - 41.06,  L 32.81   ? Time 10   ? Period Weeks   ? Status Achieved   ?  ? OT LONG TERM GOAL #3  ? Title Pt will be increase grip strength in LUE by 10 lbs or greater   ? Baseline L 19.6, R 35   ? Time 10   ? Period Weeks   ? Status Achieved   38.8 lbs LUE 09/12/21  ?  ? OT LONG TERM GOAL #4  ? Title Pt will complete alternating attention task with 90% accuracy or greater and with appropriate time.   ? Baseline Trail Making B > 5 minutes with 100% accuracy   ? Time 10   ? Period Weeks   ? Status On-going   ?  ? OT LONG TERM GOAL #5  ? Title Pt will increase shoulder  range of motion in LUE to 130 degrees or greater for increasing ability for overhead reach.   ? Baseline 125 degreees   ? Time 10   ? Period Weeks   ? Status Achieved   ?  ? OT LONG TERM GOAL #6  ? Title Complete FOTO at discharge with score of 70% or greater.   ? Baseline 62%   ? Time 10   ? Period Weeks   ? Status On-going   ? ?  ?  ? ?  ? ? ? ? ? ? ? ? Plan - 10/08/21 0924   ? ? Clinical Impression Statement Pt is progressing with cognitive tasks and problem solving, but continues to have difficulty with more complex/higher level tasks.   ? OT Occupational Profile and History Problem Focused Assessment - Including review of records relating to presenting problem   ? Occupational performance deficits (Please refer to evaluation for details): ADL's;IADL's;Leisure;Work   ? Body Structure / Function / Physical Skills ADL;Decreased knowledge of precautions;Coordination;Decreased knowledge of use of DME;Strength;GMC;IADL;ROM;FMC;UE functional use;Dexterity;Flexibility;Vision   ? Cognitive Skills Attention;Problem Solve   ? Rehab Potential Good   ? Clinical Decision Making Limited treatment options, no task modification necessary   ? Comorbidities Affecting Occupational Performance: None   ? Modification or Assistance to Complete Evaluation  No modification of tasks or assist necessary to complete eval   ? OT Frequency 2x / week   ? OT Duration Other (comment)   16 visits over 10 weeks  ? OT Treatment/Interventions Aquatic Therapy;Self-care/ADL training;Moist Heat;Fluidtherapy;DME and/or AE instruction;Therapeutic activities;Therapeutic exercise;Cognitive remediation/compensation;Visual/perceptual remediation/compensation;Passive range of motion;Neuromuscular education;Functional Mobility Training;Energy conservation;Patient/family education   ? Plan environmental scanning with cognitive component, alternating/divided attention tasks, cognitive re-training as related to work tasks   ? OT Home Exercise Plan FM  Coordination issued 09/05/21   ? Consulted and Agree with Plan of Care Patient   ? ?  ?  ? ?  ? ? ?  Patient will benefit from skilled therapeutic intervention in order to improve the following deficits and impairments:   ?Body Structure / Function / Physical Skills: ADL, Decreased knowledge of precautions, Coordination, Decreased knowledge of use of DME, Strength, GMC, IADL, ROM, FMC, UE functional use, Dexterity, Flexibility, Vision ?Cognitive Skills: Attention, Problem Solve ?  ? ? ?Visit Diagnosis: ?Attention and concentration deficit ? ?Visuospatial deficit ? ?Unsteadiness on feet ? ? ? ?Problem List ?Patient Active Problem List  ? Diagnosis Date Noted  ? Acute ischemic left PCA stroke ([place]) 08/03/2021  ? 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 Cerebral edema ([place]) 07/30/2021  ? Hyperlipidemia 07/30/2021  ? Prediabetes 07/30/2021  ? ? Vianne Bulls, Frederika ?10/08/2021, 10:23 AM ? ?[facility] ?[place] ?[place], [place], [address] ?Phone: [phone]   Fax:  [phone] ? ?Name: SUKHMAN KOCHER ?MRN: [id number] ?Date of Birth: 12-Dec-1960 ? ? ?Vianne Bulls, OTR/L ?[facility] ?[place], [place]  [address] ?[phone] phone ?[phone] ?10/08/21 10:23 AM ? ? ?

## 2021-10-08 NOTE — Therapy (Signed)
?OUTPATIENT PHYSICAL THERAPY TREATMENT NOTE ? ? ?Patient Name: [name] ?MRN: [id number] ?DOB:1961-04-26, 61 y.o., female ?Today's Date: 10/08/2021 ? ?PCP: Jearld Fenton, NP ?REFERRING PROVIDER: Izora Ribas, MD  ? ? PT End of Session - 10/08/21 0809   ? ? Visit Number 11   ? Number of Visits 17   ? Date for PT Re-Evaluation 11/23/21   pushed out due to scheduling  ? Authorization Type UHC (VL: 60 per discipline)   ? PT Start Time [phone]   pt late to appt  ? PT Stop Time 1700   ? PT Time Calculation (min) 38 min   ? Activity Tolerance Patient tolerated treatment well   ? Behavior During Therapy [facility] for tasks assessed/performed   ? ?  ?  ? ?  ? ? ? ? ? ? ?Past Medical History:  ?Diagnosis Date  ? Blood in stool   ? Hyperlipidemia   ? Vaginal fibroids   ? ?History reviewed. No pertinent surgical history. ?Patient Active Problem List  ? Diagnosis Date Noted  ? 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 Cerebral edema ([place]) 07/30/2021  ? Hyperlipidemia 07/30/2021  ? Prediabetes 07/30/2021  ? ? ?REFERRING DIAG: I63.50 (ICD-10-CM) - Cerebral artery occlusion with cerebral infarction ([place])  ? ?THERAPY DIAG:  ?Unsteadiness on feet ? ?Muscle weakness (generalized) ? ?Difficulty in walking, not elsewhere classified ? ?PERTINENT HISTORY: HLD, Prediabetes ? ?PRECAUTIONS: Fall ? ?SUBJECTIVE: No changes, feels like she is getting better. Reports HEP is getting easy.  ? ? ?PAIN:  ?Are you having pain? No ? ?Vitals:  ? 10/08/21 0813  ?BP: 123/72  ?Pulse: 62  ? ? ? ?TODAY'S TREATMENT:  ?   ?  Ther Ex  ?SciFit level 5 for 5 minutes w/BUE/BLEs for strengthening and endurance. Initial cues cues to maintain steps/min >95  ?   ?  NMR ?-Alt. Step ups w/contralateral march and 3 second hold to incr SLS time to 4" step with air ex on top (6" total) x10 reps each leg, intermittent UE support > none, cues for glute/core activation for stability. Incr difficulty with LLE.  ?-Alt. Step  downs on 4" step with air ex on top for eccentric control for stairs/curbs, intermittent UE support, performed x8 reps each leg.  ? ? ?Updated HEP as pt reports that it is getting too easy at home. ? ?Access Code: [email or web address] ?URL: [email or web address] ?Date: 10/08/2021 ?Prepared by: Janann August ? ?Exercises ?- Heel to toe walking   - 1 x daily - 7 x weekly - 3 sets - 3 reps - with added in head turns/nods ?- Backward heel to toe walking   - 1 x daily - 7 x weekly - 3 sets - 3 reps - with added in head turns/nods ?- Mini Squats with Walker and Chair  - 1 x daily - 7 x weekly - 2 sets - 10 reps ?- Standing Marching  - 1-2 x daily - 5 x weekly - 3 sets - with green tband around thighs with 3 second hold, walking forwards/backwards  ?- Romberg Stance Eyes Closed on Foam Pad  - 1-2 x daily - 5 x weekly - 3 sets - 30 hold - on 2 pillows ?- Side Stepping with Resistance at Thighs  - 1-2 x daily - 5 x weekly - 3 sets - with green tband, cues for technique and keeping toes forwards.  ? ? ?PATIENT EDUCATION: ?Education details: Updated HEP for  strength/balance.  ?Person educated: Patient ?Education method: Explanation and Demonstration ?Education comprehension: verbalized understanding ? ? ?HOME EXERCISE PROGRAM: ?Access Code: [email or web address] ? ? PT Short Term Goals  ? ?  ? PT SHORT TERM GOAL #1  ? Title Pt will be indepdent with initial HEP for balance/strength   ? Baseline no HEP established   ? Time 4   ? Period Weeks   ? Status Achieved  ? Target Date 09/28/21   ?  ? PT SHORT TERM GOAL #2  ? Title Pt will improve TUG to </= 16 seconds with LRAD to demonstrate reduced fall risk   ? Baseline 22.72 secs with RW; 11.06 secs no AD  ? Time 4   ? Period Weeks   ? Status Achieved   ?  ? PT SHORT TERM GOAL #3  ? Title Patient will improve gait speed to >/= 2.0 ft/sec w/ LRAD to demo improved community mobility   ? Baseline 1.90 ft/sec; 2.35 ft/sec  ? Time 4   ? Period Weeks   ? Status Achieved  ?  ? PT SHORT TERM GOAL #4   ? Title Pt will improve Berg score to >42/56 for reduced risk of falls   ? Baseline 35/56; 51/56  ? Time 4   ? Period Weeks   ? Status Achieved  ?  ? PT SHORT TERM GOAL #5  ? Title Pt will be able to ambulate >/= 300 ft w/ LRAD and supervision to demo improved household/community mobility   ? Baseline 120' CGA; 600' without AD with distant S* on 09/26/21   ? Time 4   ? Period Weeks   ? Status Achieved   ? ?  ?  ? ?  ? ? ? PT Long Term Goals  ? ?  ? PT LONG TERM GOAL #1  ? Title Pt will be independent with final HEP for balance/strength   ? Baseline no HEP established   ? Time 8   ? Period Weeks   ? Status New   ? Target Date 10/26/21   ?  ? PT LONG TERM GOAL #2  ? Title Pt will perform >54/56 on Berg for improved dynamic balance and reduced fall risk   ? Baseline 35/56   ? Time 8   ? Period Weeks   ? Status Revised   ?  ? PT LONG TERM GOAL #3  ? Title Pt will improve TUG to </= 10 seconds w/ LRAD to demo improved balance   ? Baseline 22.72 secs w/ RW   ? Time 8   ? Period Weeks   ? Status New   ?  ? PT LONG TERM GOAL #4  ? Title Pt will improve 5x sit <> stand to </= 12 seconds to demo improved balance   ? Baseline 14.22 secs   ? Time 8   ? Period Weeks   ? Status New   ?  ? PT LONG TERM GOAL #5  ? Title Pt will improve gait speed to >/= 2.6 ft/sec to demo improved community ambulator   ? Baseline 1.90 ft/sec   ? Time 8   ? Period Weeks   ? Status New   ?  ? PT LONG TERM GOAL #6  ? Title Pt will be able to ascend/descend 8 stairs with reciprocal pattern and supervision with use of single rail vs. no rail   ? Baseline step to pattern bil rails   ? Time 8   ? Period Weeks   ?  Status New   ?  ? PT LONG TERM GOAL #7  ? Title Pt will improve FOTO to >/= 62%   ? Baseline 49%   ? Time 8   ? Period Weeks   ? Status New   ? ?  ?  ? ?  ? ? ? Plan  ? ? Clinical Impression Statement Updated pt's HEP today to add incr difficulty for balance and strengthening. Remainder of session focused on BLE strengthening and SLS tasks on  compliant surfaces. Pt more challenged with SLS on LLE. Able to progress to no UE support with incr reps. Will continue to progress towards LTGs.  ?  ? Personal Factors and Comorbidities Comorbidity 2;Transportation   ? Comorbidities HLD, Prediabetes   ? Examination-Activity Limitations Transfers;Stairs;Stand;Locomotion Level;Squat;Bend   ? Examination-Participation Restrictions Occupation;Cleaning;Community Activity;Driving   ? Stability/Clinical Decision Making Stable/Uncomplicated   ? Rehab Potential Good   ? PT Frequency 2x / week   ? PT Duration 8 weeks   plus eval  ? PT Treatment/Interventions ADLs/Self Care Home Management;Aquatic Therapy;Canalith Repostioning;Cryotherapy;Electrical Stimulation;Moist Heat;DME Instruction;Gait training;Stair training;Functional mobility training;Therapeutic activities;Therapeutic exercise;Balance training;Neuromuscular re-education;Patient/family education;Orthotic Fit/Training;Manual techniques;Passive range of motion;Vestibular;Energy conservation   ? PT Next Visit Plan Continue gait without AD, stairs and curb negotiation.  high-level balance, single leg stance, BLE strength, BLE coordination tasks , wall sits    ? PT Home Exercise Plan [email or web address]   ? Consulted and Agree with Plan of Care Patient   ? ?  ?  ? ?  ? ? ? ? ?Arliss Journey, PT, DPT ?10/08/2021, 8:53 AM ? ?  ? ?

## 2021-10-08 NOTE — Therapy (Signed)
[facility] ?[address] ?[place], [place], [address] ?Phone: [phone]   Fax:  [phone] ? ?Speech Language Pathology Treatment ? ?Patient Details  ?Name: [name] ?MRN: [id number] ?Date of Birth: Oct 13, 1960 ?Referring Provider (SLP): Kingsley Callander., MD ? ? ?Encounter Date: 10/08/2021 ? ? End of Session - 10/08/21 1126   ? ? Visit Number 10   ? Number of Visits 25   ? Date for SLP Re-Evaluation 11/29/21   ? SLP Start Time 0845   ? SLP Stop Time  0930   ? SLP Time Calculation (min) 45 min   ? Activity Tolerance Patient tolerated treatment well   ? ?  ?  ? ?  ? ? ?Past Medical History:  ?Diagnosis Date  ? Blood in stool   ? Hyperlipidemia   ? Vaginal fibroids   ? ? ?History reviewed. No pertinent surgical history. ? ?There were no vitals filed for this visit. ? ? Subjective Assessment - 10/08/21 0849   ? ? Subjective "I had a snappin' episode this morning"   ? Currently in Pain? No/denies   ? ?  ?  ? ?  ? ? ? ? ? ? ? ? ADULT SLP TREATMENT - 10/08/21 0856   ? ?  ? General Information  ? Behavior/Cognition Alert;Cooperative;Pleasant mood   ?  ? Treatment Provided  ? Treatment provided Cognitive-Linquistic   ?  ? Cognitive-Linquistic Treatment  ? Treatment focused on Cognition;Patient/family/caregiver education   ? Skilled Treatment [name] forgot HW in the car. She completed it and recalled correct answers. Targeted error awareness and organization seqeuncing errands according to clue and giving directions. She correctly ID'd sequence of errands, required occasional min to mod A to give directions accurately. She ID'd 4 compensations/accomodations she may need upon return to work with frequent questioning cues. I instructed her to review the handout about this for [name] Garden organization and deduction for [employer]   ?  ? Assessment / Recommendations / Plan  ? Plan Continue with current plan of care   ?  ? Progression Toward Goals  ? Progression toward goals  Progressing toward goals   ? ?  ?  ? ?  ? ? ? SLP Education - 10/08/21 1127   ? ? Education Details review handout of work accommodations,   ? Person(s) Educated Patient   ? Methods Explanation;Verbal cues;Handout   ? Comprehension Verbalized understanding;Verbal cues required   ? ?  ?  ? ?  ? ? ? SLP Short Term Goals - 10/08/21 1128   ? ?  ? SLP SHORT TERM GOAL #1  ? Title Pt will use comensatory strategies for memory successfully between 3 sessions   ? Baseline 09/19/21, 10/01/21   ? Time 1   ? Period Weeks   ? Status Achieved   ? Target Date 09/28/21   ?  ? SLP SHORT TERM GOAL #2  ? Title Pt will demonstrate adeuqate organizational skills to complete mod complex therapy tasks with modified independence (compensations) in 2 sessions   ? Baseline 10/01/21   ? Time 1   ? Period Weeks   ? Status Partially Met   ? Target Date 09/28/21   ?  ? SLP SHORT TERM GOAL #3  ? Title pt will demonstrate emergent awareness during a therapy task in order to make corrections in 3 sessions   ? Baseline 09/25/30   ? Time 1   ? Period Weeks   ? Status Achieved   ?  Target Date 09/28/21   ?  ? SLP SHORT TERM GOAL #4  ? Title pt will complete cognitive testing in first 1-2 sessions   ? Time 2   ? Period --   sessions  ? Status Achieved   ? Target Date 09/14/21   ? ?  ?  ? ?  ? ? ? SLP Long Term Goals - 10/08/21 1128   ? ?  ? SLP LONG TERM GOAL #1  ? Title Pt will demonstrate adequate organizational skills to complete mod complex/complex therapy tasks with modified independence (compensations) in 4 sessions   ? Time 3   ? Period Weeks   ? Status On-going   ?  ? SLP LONG TERM GOAL #2  ? Title pt will demo anticipatory awareness by pre-planning/organizing a task prior to initiation, independently, in 3 sessions   ? Baseline 10/01/21; 10/08/21   ? Time 3   ? Period Weeks   ? Status On-going   ?  ? SLP LONG TERM GOAL #4  ? Title pt will independently use memory strategies for work-like tasks in 5 sessions   ? Baseline 10/01/21; 10/08/21   ? Time 7   ?  Period Weeks   ? Status On-going   ? ?  ?  ? ?  ? ? ? Plan - 10/08/21 1127   ? ? 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 [name] is making effort to return to IADL's and exercise. She continues to demonstrate distracablility and reduced working memory with alternating attention tasks and ID and self correcting errors.  [name] is verbalizing anticipatory awareness and demonstrating emergent awareness. Training in compensations and accomodations for attention, memory, and processing for possible return to work. She hopes to return to work - Continue skilled ST to maximize cognition for safety, return to PLOF and possible return to work in some capacity   ? Speech Therapy Frequency 2x / week   ? Duration 12 weeks   ? Treatment/Interventions Compensatory techniques;Environmental controls;SLP instruction and feedback;Cueing hierarchy;Cognitive reorganization;Compensatory strategies;Patient/family education;Internal/external aids   ? Potential to Achieve Goals Good   ? ?  ?  ? ?  ? ? ?Patient will benefit from skilled therapeutic intervention in order to improve the following deficits and impairments:   ?Cognitive communication deficit ? ? ? ?Problem List ?Patient Active Problem List  ? Diagnosis Date Noted  ? Acute ischemic left PCA stroke ([place]) 08/03/2021  ? 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 Cerebral edema ([place]) 07/30/2021  ? Hyperlipidemia 07/30/2021  ? Prediabetes 07/30/2021  ? ? ?Jodiann Ognibene, Annye Rusk, CCC-SLP ?10/08/2021, 11:30 AM ? ?[facility] ?[address] ?[place], [place], [address] ?Phone: [phone]   Fax:  [phone] ? ? ?Name: [name] ?MRN: [id number] ?Date of Birth: [birth year] ? ?

## 2021-10-10 ENCOUNTER — Ambulatory Visit: Payer: 59 | Admitting: Occupational Therapy

## 2021-10-10 ENCOUNTER — Ambulatory Visit: Payer: 59 | Admitting: Physical Therapy

## 2021-10-10 ENCOUNTER — Encounter: Payer: Self-pay | Admitting: Physical Therapy

## 2021-10-10 ENCOUNTER — Encounter: Payer: Self-pay | Admitting: Occupational Therapy

## 2021-10-10 ENCOUNTER — Ambulatory Visit: Payer: 59

## 2021-10-10 ENCOUNTER — Encounter: Payer: Self-pay | Admitting: Speech Pathology

## 2021-10-10 ENCOUNTER — Other Ambulatory Visit: Payer: Self-pay

## 2021-10-10 ENCOUNTER — Ambulatory Visit: Payer: 59 | Admitting: Speech Pathology

## 2021-10-10 VITALS — BP 126/73 | HR 56

## 2021-10-10 DIAGNOSIS — R278 Other lack of coordination: Secondary | ICD-10-CM

## 2021-10-10 DIAGNOSIS — R41840 Attention and concentration deficit: Secondary | ICD-10-CM

## 2021-10-10 DIAGNOSIS — R41841 Cognitive communication deficit: Secondary | ICD-10-CM

## 2021-10-10 DIAGNOSIS — I69354 Hemiplegia and hemiparesis following cerebral infarction affecting left non-dominant side: Secondary | ICD-10-CM

## 2021-10-10 DIAGNOSIS — R2689 Other abnormalities of gait and mobility: Secondary | ICD-10-CM

## 2021-10-10 DIAGNOSIS — M6281 Muscle weakness (generalized): Secondary | ICD-10-CM

## 2021-10-10 DIAGNOSIS — R2681 Unsteadiness on feet: Secondary | ICD-10-CM

## 2021-10-10 DIAGNOSIS — R41842 Visuospatial deficit: Secondary | ICD-10-CM

## 2021-10-10 DIAGNOSIS — R262 Difficulty in walking, not elsewhere classified: Secondary | ICD-10-CM

## 2021-10-10 NOTE — Patient Instructions (Signed)
? ?  [name], you are getting more agitated and lashing out more since the stroke ? ?This happens and is somewhat normal. After a stroke You need to self advocate and let others know you aren't handling their big emotions, whether it is excited positive energy or big negative energy - the both can overwhelm your brain causing your frustration ? ?Explain to people it is best if they speak calmly to you and not ask too many questions as your brain is trying to heal ? ?This will be especially important at work  ? ?Remember that you look great and people will forget that your brain is healing ? ?Limit calls to 10-15 minutes ?

## 2021-10-10 NOTE — Therapy (Signed)
?OUTPATIENT OCCUPATIONAL THERAPY TREATMENT NOTE ? ? ?Patient Name: [name] ?MRN: [id number] ?DOB:[birth year], 61 y.o., female ?Today's Date: 10/10/2021 ? ?PCP: Jearld Fenton, NP ?REFERRING PROVIDER: Jearld Fenton, NP ? ? OT End of Session - 10/10/21 0940   ? ? Visit Number 12   ? Number of Visits 17   ? Date for OT Re-Evaluation 11/09/21   ? Authorization Type UHC   ? Authorization Time Period VL: 60 combined (hard max), No Auth Req'd   ? Authorization - Visit Number 12   ? Authorization - Number of Visits 20   ? OT Start Time 0930   ? OT Stop Time 1015   ? OT Time Calculation (min) 45 min   ? Activity Tolerance Patient tolerated treatment well   ? Behavior During Therapy [facility] for tasks assessed/performed   ? ?  ?  ? ?  ? ? ?Past Medical History:  ?Diagnosis Date  ? Blood in stool   ? Hyperlipidemia   ? Vaginal fibroids   ? ?History reviewed. No pertinent surgical history. ?Patient Active Problem List  ? Diagnosis Date Noted  ? Acute ischemic left PCA stroke ([place]) 08/03/2021  ? 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 Cerebral edema ([place]) 07/30/2021  ? Hyperlipidemia 07/30/2021  ? Prediabetes 07/30/2021  ? ? ?ONSET DATE: 07/28/21 ? ?REFERRING DIAG: L CVA ? ?THERAPY DIAG:  ?Hemiplegia and hemiparesis following cerebral infarction affecting left non-dominant side ([place]) ? ?Attention and concentration deficit ? ?Other abnormalities of gait and mobility ? ?Visuospatial deficit ? ?Unsteadiness on feet ? ?Muscle weakness (generalized) ? ?Difficulty in walking, not elsewhere classified ? ?Other lack of coordination ? ? ?PERTINENT HISTORY: hyperlipidemia,  prediabetes and tobacco use. ? ?PRECAUTIONS: Fall Risk ? ?SUBJECTIVE: "I drove here today - the eye doctor cleared me to do so yesterday." ? ?PAIN:  ?Are you having pain? No ? ? ? ? ?OBJECTIVE:  ? ?TODAY'S TREATMENT:  ? ?10/10/21 ?Logic Puzzle (Halloween/Candy puzzle) - with max cues and assistance for solving puzzle. Pt with difficulty with deductive reasoning ? ?IQ  Fit #3 with min/mod cues for setting up puzzle and spatial organization of pieces and mod cues for solving. Pt req'd increased time for solving puzzle. ? ? ? ? OT Short Term Goals -  ALL STGs MET   ? ?  ? OT SHORT TERM GOAL #1  ? Title Pt will be independent with initial HEP   ? Time 4   ? Period Weeks   ? Status Achieved   independent with cooridnation and putty HEP 09/12/21  ? Target Date 09/28/21   ?  ? OT SHORT TERM GOAL #2  ? Title Pt will verbalize understanding of memory compensatory strategies   ? Time 4   ? Period Weeks   ? Status Achieved   using memory notebook and cell phone 09/12/21  ?  ? OT SHORT TERM GOAL #3  ? Title Pt will perform environmental scanning in moderately distracting environment with 90% accuracy or greater.   ? Time 4   ? Period Weeks   ? Status Achieved   93% accuracy 09/12/21  ?  ? OT SHORT TERM GOAL #4  ? Title Pt will increase grip strength by 5 lbs or greater in LUE.   ? Baseline L 19.6, R 35   ? Time 4   ? Period Weeks   ? Status Achieved   38.8 lbs LUE 09/12/21  ?  ? OT SHORT TERM GOAL #5  ? Title  Pt will improve 9 hole peg test by 3 seconds or more in BUE for increase in fine motor coordination and processing speed   ? Baseline R 54.37s, L 45.37s   ? Time 4   ? Period Weeks   ? Status Achieved   40.59s with LUE 09/12/21  ? ?  ?  ? ?  ? ? ? OT Long Term Goals   ? ?  ? OT LONG TERM GOAL #1  ? Title Pt will be independent with updated HEP   ? Time 10   ? Period Weeks   ? Status On-going   ? Target Date 11/09/21   ?  ? OT LONG TERM GOAL #2  ? Title Pt will increase 9 hole peg test score with BUE by 8 seconds or greater in order to increase fine motor coordination.   ? Baseline R 54.37s, L 45.37s,    3/15 - R - 41.06,  L 32.81   ? Time 10   ? Period Weeks   ? Status Achieved   ?  ? OT LONG TERM GOAL #3  ? Title Pt will be increase grip strength in LUE by 10 lbs or greater   ? Baseline L 19.6, R 35   ? Time 10   ? Period Weeks   ? Status Achieved   38.8 lbs LUE 09/12/21  ?  ? OT LONG TERM GOAL #4   ? Title Pt will complete alternating attention task with 90% accuracy or greater and with appropriate time.   ? Baseline Trail Making B > 5 minutes with 100% accuracy   ? Time 10   ? Period Weeks   ? Status On-going   ?  ? OT LONG TERM GOAL #5  ? Title Pt will increase shoulder range of motion in LUE to 130 degrees or greater for increasing ability for overhead reach.   ? Baseline 125 degreees   ? Time 10   ? Period Weeks   ? Status Achieved   ?  ? OT LONG TERM GOAL #6  ? Title Complete FOTO at discharge with score of 70% or greater.   ? Baseline 62%   ? Time 10   ? Period Weeks   ? Status On-going   ? ?  ?  ? ?  ? ? ? Plan - 10/10/21 0939   ? ? Clinical Impression Statement Anticipate d/c at end of scheduled visits. Pt continues to have difficulty with higher level and more complex cognitive tasks but is making progress.   ? OT Occupational Profile and History Problem Focused Assessment - Including review of records relating to presenting problem   ? Occupational performance deficits (Please refer to evaluation for details): ADL's;IADL's;Leisure;Work   ? Body Structure / Function / Physical Skills ADL;Decreased knowledge of precautions;Coordination;Decreased knowledge of use of DME;Strength;GMC;IADL;ROM;FMC;UE functional use;Dexterity;Flexibility;Vision   ? Cognitive Skills Attention;Problem Solve   ? Rehab Potential Good   ? Clinical Decision Making Limited treatment options, no task modification necessary   ? Comorbidities Affecting Occupational Performance: None   ? Modification or Assistance to Complete Evaluation  No modification of tasks or assist necessary to complete eval   ? OT Frequency 2x / week   ? OT Duration Other (comment)   16 visits over 10 weeks  ? OT Treatment/Interventions Aquatic Therapy;Self-care/ADL training;Moist Heat;Fluidtherapy;DME and/or AE instruction;Therapeutic activities;Therapeutic exercise;Cognitive remediation/compensation;Visual/perceptual remediation/compensation;Passive range  of motion;Neuromuscular education;Functional Mobility Training;Energy conservation;Patient/family education   ? Plan anticipate d/c at end of scheduled visits.   ?  OT Home Exercise Plan FM Coordination issued 09/05/21   ? Consulted and Agree with Plan of Care Patient   ? ?  ?  ? ?  ? ? ? ?Zachery Conch, OT ?10/10/2021, 9:51 AM ? ?  ? ? ? ?

## 2021-10-10 NOTE — Therapy (Signed)
?OUTPATIENT PHYSICAL THERAPY TREATMENT NOTE ? ? ?Patient Name: [name] ?MRN: [id number] ?DOB:06/20/1961, 61 y.o., female ?Today's Date: 10/10/2021 ? ?PCP: Jearld Fenton, NP ?REFERRING PROVIDER: Izora Ribas, MD  ? ? PT End of Session - 10/10/21 1108   ? ? Visit Number 12   ? Number of Visits 17   ? Date for PT Re-Evaluation 11/23/21   pushed out due to scheduling  ? Authorization Type UHC (VL: 60 per discipline)   ? PT Start Time 1107   pt in restroom at start of session  ? PT Stop Time 1147   ? PT Time Calculation (min) 40 min   ? Activity Tolerance Patient tolerated treatment well   ? Behavior During Therapy [facility] for tasks assessed/performed   ? ?  ?  ? ?  ? ? ? ? ? ? ?Past Medical History:  ?Diagnosis Date  ? Blood in stool   ? Hyperlipidemia   ? Vaginal fibroids   ? ?History reviewed. No pertinent surgical history. ?Patient Active Problem List  ? Diagnosis Date Noted  ? 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 Cerebral edema ([place]) 07/30/2021  ? Hyperlipidemia 07/30/2021  ? Prediabetes 07/30/2021  ? ? ?REFERRING DIAG: I63.50 (ICD-10-CM) - Cerebral artery occlusion with cerebral infarction ([place])  ? ?THERAPY DIAG:  ?Other abnormalities of gait and mobility ? ?Muscle weakness (generalized) ? ?Unsteadiness on feet ? ?PERTINENT HISTORY: HLD, Prediabetes ? ?PRECAUTIONS: Fall ? ?SUBJECTIVE: Saw the optometrist and pt reports that she has been cleared to drive and that she has no issues with her vision.  ? ? ?PAIN:  ?Are you having pain? No ? ?Vitals:  ? 10/10/21 1146  ?BP: 126/73  ?Pulse: (!) 56  ? ? ? ?TODAY'S TREATMENT:  ?   ?  Ther Ex  ?SciFit level 5 for 8 minutes w/BUE/BLEs for strengthening and endurance. Initial cues cues to maintain steps/min >95  ?   ?  NMR ?-On trampoline: jumping in and out x10 reps for balance/coordination, cues to see if pt can slightly incr height of jump. Slow marching for SLS x10 reps each side and cues for hip hip flexion,  performed an additional x5 reps each side with cues for pt to slightly incr the speed, min guard at times and pt needing to grab onto handrail for balance.  ? ?-On rockerboard: alternating stepping up and then tapping forwards cone (on 6" step for incr height) and then progressed to cross body cone tap, added difficulty by placing air ex on rockerboard for additional ankle strategy, multiple reps, incr difficulty on LLE.  ? ?-Forwards/retro marching with ball toss with PT tech for balance,coordination,and dual tasking. Performed down and back over 30' x2 reps. Min guard for balance, cues to maintain marching throughout vs. Taking steps in between marches. Pt improved with incr reps.  ? ? ? [place] Adult PT Treatment/Exercise - 10/10/21 1143   ? ?  ? Ambulation/Gait  ? Stairs Yes   ? Stairs Assistance 5: Supervision   ? Stairs Assistance Details (indicate cue type and reason) Cues for keeping hips in neutral position when descending vs. rotating hips to descend. Pt initially apprehensive when descending, but pt improved confidence with incr reps   ? Stair Management Technique No rails;Alternating pattern   ? Number of Stairs 20   4 sets of 5 reps  ? Height of Stairs 6   ? ?  ?  ? ?  ? ? ? ? ? ? ? ? ?  PATIENT EDUCATION: ?Education details: Continue HEP.  ?Person educated: Patient ?Education method: Explanation and Demonstration ?Education comprehension: verbalized understanding ? ? ?HOME EXERCISE PROGRAM: ?Access Code: [email or web address] ? ? PT Short Term Goals  ? ?  ? PT SHORT TERM GOAL #1  ? Title Pt will be indepdent with initial HEP for balance/strength   ? Baseline no HEP established   ? Time 4   ? Period Weeks   ? Status Achieved  ? Target Date 09/28/21   ?  ? PT SHORT TERM GOAL #2  ? Title Pt will improve TUG to </= 16 seconds with LRAD to demonstrate reduced fall risk   ? Baseline 22.72 secs with RW; 11.06 secs no AD  ? Time 4   ? Period Weeks   ? Status Achieved   ?  ? PT SHORT TERM GOAL #3  ? Title Patient will improve  gait speed to >/= 2.0 ft/sec w/ LRAD to demo improved community mobility   ? Baseline 1.90 ft/sec; 2.35 ft/sec  ? Time 4   ? Period Weeks   ? Status Achieved  ?  ? PT SHORT TERM GOAL #4  ? Title Pt will improve Berg score to >42/56 for reduced risk of falls   ? Baseline 35/56; 51/56  ? Time 4   ? Period Weeks   ? Status Achieved  ?  ? PT SHORT TERM GOAL #5  ? Title Pt will be able to ambulate >/= 300 ft w/ LRAD and supervision to demo improved household/community mobility   ? Baseline 120' CGA; 600' without AD with distant S* on 09/26/21   ? Time 4   ? Period Weeks   ? Status Achieved   ? ?  ?  ? ?  ? ? ? PT Long Term Goals  ? ?  ? PT LONG TERM GOAL #1  ? Title Pt will be independent with final HEP for balance/strength   ? Baseline no HEP established   ? Time 8   ? Period Weeks   ? Status New   ? Target Date 10/26/21   ?  ? PT LONG TERM GOAL #2  ? Title Pt will perform >54/56 on Berg for improved dynamic balance and reduced fall risk   ? Baseline 35/56   ? Time 8   ? Period Weeks   ? Status Revised   ?  ? PT LONG TERM GOAL #3  ? Title Pt will improve TUG to </= 10 seconds w/ LRAD to demo improved balance   ? Baseline 22.72 secs w/ RW   ? Time 8   ? Period Weeks   ? Status New   ?  ? PT LONG TERM GOAL #4  ? Title Pt will improve 5x sit <> stand to </= 12 seconds to demo improved balance   ? Baseline 14.22 secs   ? Time 8   ? Period Weeks   ? Status New   ?  ? PT LONG TERM GOAL #5  ? Title Pt will improve gait speed to >/= 2.6 ft/sec to demo improved community ambulator   ? Baseline 1.90 ft/sec   ? Time 8   ? Period Weeks   ? Status New   ?  ? PT LONG TERM GOAL #6  ? Title Pt will be able to ascend/descend 8 stairs with reciprocal pattern and supervision with use of single rail vs. no rail   ? Baseline step to pattern bil rails   ? Time 8   ?  Period Weeks   ? Status New   ?  ? PT LONG TERM GOAL #7  ? Title Pt will improve FOTO to >/= 62%   ? Baseline 49%   ? Time 8   ? Period Weeks   ? Status New   ? ?  ?  ? ?  ? ? ?  Plan  ? ? Clinical Impression Statement Pt able to perform alternating pattern with stairs with supervision, but pt initially more hesitant with descending, but improved with incr reps. Remainder of session focused on dynamic balance/gait tasks with focus on SLS. Pt continues to be more challenged with SLS on LLE.Will continue to progress towards LTGs.  ?  ? Personal Factors and Comorbidities Comorbidity 2;Transportation   ? Comorbidities HLD, Prediabetes   ? Examination-Activity Limitations Transfers;Stairs;Stand;Locomotion Level;Squat;Bend   ? Examination-Participation Restrictions Occupation;Cleaning;Community Activity;Driving   ? Stability/Clinical Decision Making Stable/Uncomplicated   ? Rehab Potential Good   ? PT Frequency 2x / week   ? PT Duration 8 weeks   plus eval  ? PT Treatment/Interventions ADLs/Self Care Home Management;Aquatic Therapy;Canalith Repostioning;Cryotherapy;Electrical Stimulation;Moist Heat;DME Instruction;Gait training;Stair training;Functional mobility training;Therapeutic activities;Therapeutic exercise;Balance training;Neuromuscular re-education;Patient/family education;Orthotic Fit/Training;Manual techniques;Passive range of motion;Vestibular;Energy conservation   ? PT Next Visit Plan Continue gait without AD with dual tasking, high-level balance, single leg stance, BLE strength, BLE coordination tasks , wall sits    ? PT Home Exercise Plan [email or web address]   ? Consulted and Agree with Plan of Care Patient   ? ?  ?  ? ?  ? ? ? ? ?Arliss Journey, PT, DPT ?10/10/2021, 11:51 AM ? ?  ? ?

## 2021-10-10 NOTE — Therapy (Signed)
[facility] [address] ?[place], [place], [address] ?Phone: [phone]   Fax:  [phone] ? ?Speech Language Pathology Treatment ? ?Patient Details  ?Name: [name] ?MRN: [id number] ?Date of Birth: 1960/11/30 ?Referring Provider (SLP): Kingsley Callander., MD ? ? ?Encounter Date: 10/10/2021 ? ? End of Session - 10/10/21 1131   ? ? Visit Number 11   ? Number of Visits 25   ? Date for SLP Re-Evaluation 11/29/21   ? SLP Start Time 0845   ? SLP Stop Time  0930   ? SLP Time Calculation (min) 45 min   ? ?  ?  ? ?  ? ? ?Past Medical History:  ?Diagnosis Date  ? Blood in stool   ? Hyperlipidemia   ? Vaginal fibroids   ? ? ?History reviewed. No pertinent surgical history. ? ?There were no vitals filed for this visit. ? ? ? ? ? ? ? ? ? ADULT SLP TREATMENT - 10/10/21 0907   ? ?  ? General Information  ? Behavior/Cognition Alert;Cooperative;Pleasant mood   ?  ? Treatment Provided  ? Treatment provided Cognitive-Linquistic   ?  ? Cognitive-Linquistic Treatment  ? Treatment focused on Cognition;Patient/family/caregiver education   ? Skilled Treatment [name] continues to report getting agitated and lashing out at her family and co-worker who called. Ongoing education re: agitation after CVA and she will need to self advocate that you need others to speak calmly and slowly to you and if you lash out it is because your brain is overwhelmed. Organization HW returned incomplete with errors. She required usual mod A to follow detailed directions and deduce to place items in chart correctly. She verbalized 3 strategies for accomodations she may need if she returns to work with rare min A as she reviewed the list for [facility]. She did HW of looking up and preparing a smoothie recipe with success indicating planning and organization in kitchen and research task   ?  ? Assessment / Recommendations / Plan  ? Plan Continue with current plan of care   ?  ? Progression Toward Goals  ?  Progression toward goals Progressing toward goals   ? ?  ?  ? ?  ? ? ? SLP Education - 10/10/21 1126   ? ? Education Details self advocate before you lash out   ? Person(s) Educated Patient   ? Methods Explanation;Verbal cues;Handout   ? ?  ?  ? ?  ? ? ? SLP Short Term Goals - 10/10/21 1130   ? ?  ? SLP SHORT TERM GOAL #1  ? Title Pt will use comensatory strategies for memory successfully between 3 sessions   ? Baseline 09/19/21, 10/01/21   ? Time 1   ? Period Weeks   ? Status Achieved   ? Target Date 09/28/21   ?  ? SLP SHORT TERM GOAL #2  ? Title Pt will demonstrate adeuqate organizational skills to complete mod complex therapy tasks with modified independence (compensations) in 2 sessions   ? Baseline 10/01/21   ? Time 1   ? Period Weeks   ? Status Partially Met   ? Target Date 09/28/21   ?  ? SLP SHORT TERM GOAL #3  ? Title pt will demonstrate emergent awareness during a therapy task in order to make corrections in 3 sessions   ? Baseline 09/25/30   ? Time 1   ? Period Weeks   ? Status Achieved   ? Target  Date 09/28/21   ?  ? SLP SHORT TERM GOAL #4  ? Title pt will complete cognitive testing in first 1-2 sessions   ? Time 2   ? Period --   sessions  ? Status Achieved   ? Target Date 09/14/21   ? ?  ?  ? ?  ? ? ? SLP Long Term Goals - 10/10/21 1130   ? ?  ? SLP LONG TERM GOAL #1  ? Title Pt will demonstrate adequate organizational skills to complete mod complex/complex therapy tasks with modified independence (compensations) in 4 sessions   ? Time 3   ? Period Weeks   ? Status On-going   ?  ? SLP LONG TERM GOAL #2  ? Title pt will demo anticipatory awareness by pre-planning/organizing a task prior to initiation, independently, in 3 sessions   ? Baseline 10/01/21; 10/08/21   ? Time 3   ? Period Weeks   ? Status On-going   ?  ? SLP LONG TERM GOAL #4  ? Title pt will independently use memory strategies for work-like tasks in 5 sessions   ? Baseline 10/01/21; 10/08/21   ? Time 7   ? Period Weeks   ? Status On-going   ? ?  ?   ? ?  ? ? ? Plan - 10/10/21 1128   ? ? 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 [name] is making effort to return to IADL's and exercise. Distractability has improved and she is alternating attention, however she continues to require mod A for complex organization wich may affect return to work.  [name] is verbalizing anticipatory awareness and demonstrating emergent awareness. She verbalizes work Music therapist for attention, memory, and processing for possible return to work. She hopes to return to work - Continue skilled ST to maximize cognition for safety, return to PLOF and possible return to work in some capacity   ? Speech Therapy Frequency 2x / week   ? Duration 12 weeks   ? Treatment/Interventions Compensatory techniques;Environmental controls;SLP instruction and feedback;Cueing hierarchy;Cognitive reorganization;Compensatory strategies;Patient/family education;Internal/external aids   ? Potential to Achieve Goals Good   ? Consulted and Agree with Plan of Care Patient   ? ?  ?  ? ?  ? ? ?Patient will benefit from skilled therapeutic intervention in order to improve the following deficits and impairments:   ?Cognitive communication deficit ? ? ? ?Problem List ?Patient Active Problem List  ? Diagnosis Date Noted  ? Acute ischemic left PCA stroke ([place]) 08/03/2021  ? 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 Cerebral edema ([place]) 07/30/2021  ? Hyperlipidemia 07/30/2021  ? Prediabetes 07/30/2021  ? ? ?[name], [name], CCC-SLP ?10/10/2021, 11:31 AM ? ?[facility] ?[address] ?[place], [place], [address] ?Phone: [phone]   Fax:  [phone] ? ? ?Name: [name] ?MRN: [id number] ?Date of Birth: 12/20/1960 ? ?

## 2021-10-15 ENCOUNTER — Ambulatory Visit: Payer: 59 | Admitting: Occupational Therapy

## 2021-10-15 ENCOUNTER — Encounter: Payer: Self-pay | Admitting: Occupational Therapy

## 2021-10-15 ENCOUNTER — Encounter: Payer: Self-pay | Admitting: Speech Pathology

## 2021-10-15 ENCOUNTER — Ambulatory Visit: Payer: 59 | Attending: Internal Medicine

## 2021-10-15 ENCOUNTER — Ambulatory Visit: Payer: 59 | Admitting: Speech Pathology

## 2021-10-15 VITALS — BP 114/70 | HR 64

## 2021-10-15 DIAGNOSIS — R2681 Unsteadiness on feet: Secondary | ICD-10-CM

## 2021-10-15 DIAGNOSIS — R41840 Attention and concentration deficit: Secondary | ICD-10-CM

## 2021-10-15 DIAGNOSIS — R41841 Cognitive communication deficit: Secondary | ICD-10-CM | POA: Diagnosis present

## 2021-10-15 DIAGNOSIS — R278 Other lack of coordination: Secondary | ICD-10-CM

## 2021-10-15 DIAGNOSIS — M6281 Muscle weakness (generalized): Secondary | ICD-10-CM

## 2021-10-15 DIAGNOSIS — I69354 Hemiplegia and hemiparesis following cerebral infarction affecting left non-dominant side: Secondary | ICD-10-CM | POA: Insufficient documentation

## 2021-10-15 DIAGNOSIS — R41842 Visuospatial deficit: Secondary | ICD-10-CM | POA: Diagnosis present

## 2021-10-15 DIAGNOSIS — R2689 Other abnormalities of gait and mobility: Secondary | ICD-10-CM | POA: Insufficient documentation

## 2021-10-15 NOTE — Therapy (Signed)
?OUTPATIENT OCCUPATIONAL THERAPY TREATMENT NOTE ? ? ?Patient Name: [name] ?MRN: [id number] ?DOB:Oct 01, 1960, 61 y.o., female ?Today's Date: 10/15/2021 ? ?PCP: Jearld Fenton, NP ?REFERRING PROVIDER: Jearld Fenton, NP ? ? OT End of Session - 10/15/21 5400   ? ? Visit Number 13   ? Number of Visits 17   ? Date for OT Re-Evaluation 11/09/21   ? Authorization Type UHC   ? Authorization Time Period VL: 60 combined (hard max), No Auth Req'd   ? Authorization - Visit Number 13   ? Authorization - Number of Visits 20   ? OT Start Time 0845   ? OT Stop Time 0930   ? OT Time Calculation (min) 45 min   ? Activity Tolerance Patient tolerated treatment well   ? Behavior During Therapy [facility] for tasks assessed/performed   ? ?  ?  ? ?  ? ? ? ?Past Medical History:  ?Diagnosis Date  ? Blood in stool   ? Hyperlipidemia   ? Vaginal fibroids   ? ?History reviewed. No pertinent surgical history. ?Patient Active Problem List  ? Diagnosis Date Noted  ? Acute ischemic left PCA stroke ([place]) 08/03/2021  ? 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 Cerebral edema ([place]) 07/30/2021  ? Hyperlipidemia 07/30/2021  ? Prediabetes 07/30/2021  ? ? ?ONSET DATE: 07/28/21 ? ?REFERRING DIAG: L CVA ? ?THERAPY DIAG:  ?Muscle weakness (generalized) ? ?Unsteadiness on feet ? ?Other abnormalities of gait and mobility ? ?Hemiplegia and hemiparesis following cerebral infarction affecting left non-dominant side ([place]) ? ?Attention and concentration deficit ? ?Visuospatial deficit ? ?Other lack of coordination ? ? ?PERTINENT HISTORY: hyperlipidemia,  prediabetes and tobacco use. ? ?PRECAUTIONS: Fall Risk ? ?SUBJECTIVE: "I've come a long ways" ? ?PAIN:  ?Are you having pain? No ? ? ? ? ?OBJECTIVE:  ? ?TODAY'S TREATMENT:  ? ?10/15/21 ?Sudoku (beginner level) with max cues for how to solve puzzle and instruction at beginning. Pt continued to solve puzzle and req'd mod cues for strategies. Increased time. ? ?Constant Therapy Alphabetize Alternating Words level 1 with  90% accuracy and 68.6s response time. ? ? ? ? ?10/10/21 ?Logic Puzzle (Halloween/Candy puzzle) - with max cues and assistance for solving puzzle. Pt with difficulty with deductive reasoning ? ?IQ Fit #3 with min/mod cues for setting up puzzle and spatial organization of pieces and mod cues for solving. Pt req'd increased time for solving puzzle. ? ? ? ? OT Short Term Goals -  ALL STGs MET   ? ?  ? OT SHORT TERM GOAL #1  ? Title Pt will be independent with initial HEP   ? Time 4   ? Period Weeks   ? Status Achieved   independent with cooridnation and putty HEP 09/12/21  ? Target Date 09/28/21   ?  ? OT SHORT TERM GOAL #2  ? Title Pt will verbalize understanding of memory compensatory strategies   ? Time 4   ? Period Weeks   ? Status Achieved   using memory notebook and cell phone 09/12/21  ?  ? OT SHORT TERM GOAL #3  ? Title Pt will perform environmental scanning in moderately distracting environment with 90% accuracy or greater.   ? Time 4   ? Period Weeks   ? Status Achieved   93% accuracy 09/12/21  ?  ? OT SHORT TERM GOAL #4  ? Title Pt will increase grip strength by 5 lbs or greater in LUE.   ? Baseline L 19.6, R 35   ?  Time 4   ? Period Weeks   ? Status Achieved   38.8 lbs LUE 09/12/21  ?  ? OT SHORT TERM GOAL #5  ? Title Pt will improve 9 hole peg test by 3 seconds or more in BUE for increase in fine motor coordination and processing speed   ? Baseline R 54.37s, L 45.37s   ? Time 4   ? Period Weeks   ? Status Achieved   40.59s with LUE 09/12/21  ? ?  ?  ? ?  ? ? ? OT Long Term Goals   ? ?  ? OT LONG TERM GOAL #1  ? Title Pt will be independent with updated HEP   ? Time 10   ? Period Weeks   ? Status On-going   ? Target Date 11/09/21   ?  ? OT LONG TERM GOAL #2  ? Title Pt will increase 9 hole peg test score with BUE by 8 seconds or greater in order to increase fine motor coordination.   ? Baseline R 54.37s, L 45.37s,    3/15 - R - 41.06,  L 32.81   ? Time 10   ? Period Weeks   ? Status Achieved   ?  ? OT LONG TERM GOAL #3   ? Title Pt will be increase grip strength in LUE by 10 lbs or greater   ? Baseline L 19.6, R 35   ? Time 10   ? Period Weeks   ? Status Achieved   38.8 lbs LUE 09/12/21  ?  ? OT LONG TERM GOAL #4  ? Title Pt will complete alternating attention task with 90% accuracy or greater and with appropriate time.   ? Baseline Trail Making B > 5 minutes with 100% accuracy   ? Time 10   ? Period Weeks   ? Status On-going   ?  ? OT LONG TERM GOAL #5  ? Title Pt will increase shoulder range of motion in LUE to 130 degrees or greater for increasing ability for overhead reach.   ? Baseline 125 degreees   ? Time 10   ? Period Weeks   ? Status Achieved   ?  ? OT LONG TERM GOAL #6  ? Title Complete FOTO at discharge with score of 70% or greater.   ? Baseline 62%   ? Time 10   ? Period Weeks   ? Status On-going   ? ?  ?  ? ?  ? ? ? Plan   ? ? Clinical Impression Statement Pt continues to progress with higher level processing and problem solving.   ? OT Occupational Profile and History Problem Focused Assessment - Including review of records relating to presenting problem   ? Occupational performance deficits (Please refer to evaluation for details): ADL's;IADL's;Leisure;Work   ? Body Structure / Function / Physical Skills ADL;Decreased knowledge of precautions;Coordination;Decreased knowledge of use of DME;Strength;GMC;IADL;ROM;FMC;UE functional use;Dexterity;Flexibility;Vision   ? Cognitive Skills Attention;Problem Solve   ? Rehab Potential Good   ? Clinical Decision Making Limited treatment options, no task modification necessary   ? Comorbidities Affecting Occupational Performance: None   ? Modification or Assistance to Complete Evaluation  No modification of tasks or assist necessary to complete eval   ? OT Frequency 2x / week   ? OT Duration Other (comment)   16 visits over 10 weeks  ? OT Treatment/Interventions Aquatic Therapy;Self-care/ADL training;Moist Heat;Fluidtherapy;DME and/or AE instruction;Therapeutic  activities;Therapeutic exercise;Cognitive remediation/compensation;Visual/perceptual remediation/compensation;Passive range of motion;Neuromuscular education;Functional Mobility  Training;Energy conservation;Patient/family education   ? Plan anticipate d/c at next visit.   ? OT Home Exercise Plan FM Coordination issued 09/05/21   ? Consulted and Agree with Plan of Care Patient   ? ?  ?  ? ?  ? ? ? ? ?Zachery Conch, OT ?10/15/2021, 9:30 AM ? ?  ? ? ? ?

## 2021-10-15 NOTE — Therapy (Signed)
[facility], [place], [address] ?Phone: [phone]   Fax:  [phone] ? ?Speech Language Pathology Treatment ? ?Patient Details  ?Name: [name] ?MRN: [id number] ?Date of Birth: Nov 09, 1960 ?Referring Provider (SLP): Kingsley Callander., MD ? ? ?Encounter Date: 10/15/2021 ? ? End of Session - 10/15/21 1454   ? ? Visit Number 12   ? Number of Visits 25   ? Date for SLP Re-Evaluation 11/29/21   ? SLP Start Time 0930   ? SLP Stop Time  1015   ? SLP Time Calculation (min) 45 min   ? Activity Tolerance Patient tolerated treatment well   ? ?  ?  ? ?  ? ? ?Past Medical History:  ?Diagnosis Date  ? Blood in stool   ? Hyperlipidemia   ? Vaginal fibroids   ? ? ?History reviewed. No pertinent surgical history. ? ?There were no vitals filed for this visit. ? ? ? ? ? ? ? ? ? ADULT SLP TREATMENT - 10/15/21 0936   ? ?  ? General Information  ? Behavior/Cognition Alert;Cooperative;Pleasant mood   ?  ? Treatment Provided  ? Treatment provided Cognitive-Linquistic   ?  ? Cognitive-Linquistic Treatment  ? Treatment focused on Cognition;Patient/family/caregiver education   ? Skilled Treatment [name] continues to self advocate for her family to carryover strategies to [place] with her memory and processing.Nyna continues to not smoke, she has changed her diet to no processed, low sodium foods and increased her fruits and veggies. Meal planning and cooking is successful with mod I.  She returns with [facility] completed and correct. Targeted organization, error awareness and alternating attention organizing stores with deduction cues. She required occasional min A for attention to details - task completed with 85% accuracy. She carried over strategies of packing a little at a time, double checking clothes and toiletries, and will go home and review her suitcase 1 more time before leaving for her vacation today.   ?  ? Assessment / Recommendations / Plan  ? Plan  Continue with current plan of care   ?  ? Progression Toward Goals  ? Progression toward goals Progressing toward goals   ? ?  ?  ? ?  ? ? ? ? ? SLP Short Term Goals - 10/15/21 1017   ? ?  ? SLP SHORT TERM GOAL #1  ? Title Pt will use comensatory strategies for memory successfully between 3 sessions   ? Baseline 09/19/21, 10/01/21   ? Time 1   ? Period Weeks   ? Status Achieved   ? Target Date 09/28/21   ?  ? SLP SHORT TERM GOAL #2  ? Title Pt will demonstrate adeuqate organizational skills to complete mod complex therapy tasks with modified independence (compensations) in 2 sessions   ? Baseline 10/01/21   ? Time 1   ? Period Weeks   ? Status Partially Met   ? Target Date 09/28/21   ?  ? SLP SHORT TERM GOAL #3  ? Title pt will demonstrate emergent awareness during a therapy task in order to make corrections in 3 sessions   ? Baseline 09/25/30   ? Time 1   ? Period Weeks   ? Status Achieved   ? Target Date 09/28/21   ?  ? SLP SHORT TERM GOAL #4  ? Title pt will complete cognitive testing in first 1-2 sessions   ? Time 2   ? Period --  sessions  ? Status Achieved   ? Target Date 09/14/21   ? ?  ?  ? ?  ? ? ? SLP Long Term Goals - 10/15/21 1453   ? ?  ? SLP LONG TERM GOAL #1  ? Title Pt will demonstrate adequate organizational skills to complete mod complex/complex therapy tasks with modified independence (compensations) in 4 sessions   ? Baseline 10/15/21;   ? Time 2   ? Period Weeks   ? Status On-going   ?  ? SLP LONG TERM GOAL #2  ? Title pt will demo anticipatory awareness by pre-planning/organizing a task prior to initiation, independently, in 3 sessions   ? Baseline 10/01/21; 10/08/21; 10/15/21   ? Time 3   ? Period Weeks   ? Status Achieved   ?  ? SLP LONG TERM GOAL #4  ? Title pt will independently use memory strategies for work-like tasks in 5 sessions   ? Baseline 10/01/21; 10/08/21; 10/15/21   ? Time 6   ? Period Weeks   ? Status On-going   ? ?  ?  ? ?  ? ? ? Plan - 10/15/21 1013   ? ? Clinical Impression Statement  Improving mild cognitive communication impairments. [name] is successfully carrying over compensations for attention, memory and processing including breaking tasks up into smaller parts, taking breaks, self advocating, and using lists. She verbalizes accomodations she may benefit from upon return to work with min A. She continues to report some diffculty alternating and dividing her attention but uses strategies for this. Continue skilled ST 1 more time to maximize carryover of compensations for cognition for safety, independence and possible return to work. She  has returned to driving short distances with success. Will complete PROM last session   ? Speech Therapy Frequency 2x / week   ? Duration 12 weeks   ? Treatment/Interventions Compensatory techniques;Environmental controls;SLP instruction and feedback;Cueing hierarchy;Cognitive reorganization;Compensatory strategies;Patient/family education;Internal/external aids   ? Potential to Achieve Goals Good   ? ?  ?  ? ?  ? ? ?Patient will benefit from skilled therapeutic intervention in order to improve the following deficits and impairments:   ?Cognitive communication deficit ? ? ? ?Problem List ?Patient Active Problem List  ? Diagnosis Date Noted  ? 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 Cerebral edema ([place]) 07/30/2021  ? Hyperlipidemia 07/30/2021  ? Prediabetes 07/30/2021  ? ? ?Jonice Cerra, Annye Rusk, CCC-SLP ?10/15/2021, 2:55 PM ? ?[facility] ?[place], [place], [address] ?Phone: [phone]   Fax:  [phone] ? ? ?Name: [name] ?MRN: [id number] ?Date of Birth: August 21, 1960 ? ?

## 2021-10-15 NOTE — Therapy (Signed)
?OUTPATIENT PHYSICAL THERAPY TREATMENT NOTE ? ? ?Patient Name: [name] ?MRN: [id number] ?DOB:1960/11/22, 61 y.o., female ?Today's Date: 10/15/2021 ? ?PCP: Jearld Fenton, NP ?REFERRING PROVIDER: Izora Ribas, MD  ? ? PT End of Session - 10/15/21 0809   ? ? Visit Number 13   ? Number of Visits 17   ? Date for PT Re-Evaluation 11/23/21   pushed out due to scheduling  ? Authorization Type [employer] (VL: 60 per discipline)   ? PT Start Time [phone]   pt arriving late  ? PT Stop Time 0844   ? PT Time Calculation (min) 37 min   ? Activity Tolerance Patient tolerated treatment well   ? Behavior During Therapy [facility] for tasks assessed/performed   ? ?  ?  ? ?  ? ? ? ? ? ? ?Past Medical History:  ?Diagnosis Date  ? Blood in stool   ? Hyperlipidemia   ? Vaginal fibroids   ? ?History reviewed. No pertinent surgical history. ?Patient Active Problem List  ? Diagnosis Date Noted  ? Acute ischemic left PCA stroke ([place]) 08/03/2021  ? 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 Cerebral edema ([place]) 07/30/2021  ? Hyperlipidemia 07/30/2021  ? Prediabetes 07/30/2021  ? ? ?REFERRING DIAG: I63.50 (ICD-10-CM) - Cerebral artery occlusion with cerebral infarction ([place])  ? ?THERAPY DIAG:  ?Other abnormalities of gait and mobility ? ?Muscle weakness (generalized) ? ?Unsteadiness on feet ? ?PERTINENT HISTORY: HLD, Prediabetes ? ?PRECAUTIONS: Fall ? ?SUBJECTIVE: Patient reports she did not sleep well last night. Reports driving is going well. No other new changes. Will not be able to be here Wednesday.  ? ?PAIN:  ?Are you having pain? No ? ?Vitals:  ? 10/15/21 0812  ?BP: 114/70  ?Pulse: 64  ? ? ?TODAY'S TREATMENT:  ?   ?  Ther Ex  ?NuStep on level 5 for 6 minutes w/ BUE/BLEs for strengthening and endurance. Initial cues cues to maintain steps/min >95  ?   ?  NMR ?On inverted BOSU: without UE support holding steady 2 x 30 seconds, then transitioned to weight shift to R/L and Ant/Post x 15 reps. PT providing cues for weight shift through LE vs  significant posterior trunk lean,  intermittent UE support required. Then transitioned to holding BOSU steady and horizontal/vertical head turns x 15 reps each direction. Then completed mini squat on inverted BOSU without UE support x 10 reps. Intermittent rest breaks required due to fatigue in BLE.  ? ?Completed ambulation forward with toe tap to cone prior to step over to promote SLS, added in dual tasking naming fruit/vegetable with each tap. Increased time required with dual tasking.  ? ?Completed ambulation forward with self ball toss with PT stating letter of alphabet, with patient having to name state that started with the specific letter. Completed x 230 ft around gym, 1-2 instances of imbalance catching toes on ground but able to maintain balance without assistance from PT.  ? ? ? ? ? ?PATIENT EDUCATION: ?Education details: Continue HEP ?Person educated: Patient ?Education method: Explanation and Demonstration ?Education comprehension: verbalized understanding ? ? ?HOME EXERCISE PROGRAM: ?Access Code: [email or web address] ? ? PT Short Term Goals  ? ?  ? PT SHORT TERM GOAL #1  ? Title Pt will be indepdent with initial HEP for balance/strength   ? Baseline no HEP established   ? Time 4   ? Period Weeks   ? Status Achieved  ? Target Date 09/28/21   ?  ? PT SHORT TERM GOAL #  2  ? Title Pt will improve TUG to </= 16 seconds with LRAD to demonstrate reduced fall risk   ? Baseline 22.72 secs with RW; 11.06 secs no AD  ? Time 4   ? Period Weeks   ? Status Achieved   ?  ? PT SHORT TERM GOAL #3  ? Title Patient will improve gait speed to >/= 2.0 ft/sec w/ LRAD to demo improved community mobility   ? Baseline 1.90 ft/sec; 2.35 ft/sec  ? Time 4   ? Period Weeks   ? Status Achieved  ?  ? PT SHORT TERM GOAL #4  ? Title Pt will improve Berg score to >42/56 for reduced risk of falls   ? Baseline 35/56; 51/56  ? Time 4   ? Period Weeks   ? Status Achieved  ?  ? PT SHORT TERM GOAL #5  ? Title Pt will be able to ambulate >/= 300 ft w/  LRAD and supervision to demo improved household/community mobility   ? Baseline 120' CGA; 600' without AD with distant S* on 09/26/21   ? Time 4   ? Period Weeks   ? Status Achieved   ? ?  ?  ? ?  ? ? ? PT Long Term Goals  ? ?  ? PT LONG TERM GOAL #1  ? Title Pt will be independent with final HEP for balance/strength   ? Baseline no HEP established   ? Time 8   ? Period Weeks   ? Status New   ? Target Date 10/26/21   ?  ? PT LONG TERM GOAL #2  ? Title Pt will perform >54/56 on Berg for improved dynamic balance and reduced fall risk   ? Baseline 35/56   ? Time 8   ? Period Weeks   ? Status Revised   ?  ? PT LONG TERM GOAL #3  ? Title Pt will improve TUG to </= 10 seconds w/ LRAD to demo improved balance   ? Baseline 22.72 secs w/ RW   ? Time 8   ? Period Weeks   ? Status New   ?  ? PT LONG TERM GOAL #4  ? Title Pt will improve 5x sit <> stand to </= 12 seconds to demo improved balance   ? Baseline 14.22 secs   ? Time 8   ? Period Weeks   ? Status New   ?  ? PT LONG TERM GOAL #5  ? Title Pt will improve gait speed to >/= 2.6 ft/sec to demo improved community ambulator   ? Baseline 1.90 ft/sec   ? Time 8   ? Period Weeks   ? Status New   ?  ? PT LONG TERM GOAL #6  ? Title Pt will be able to ascend/descend 8 stairs with reciprocal pattern and supervision with use of single rail vs. no rail   ? Baseline step to pattern bil rails   ? Time 8   ? Period Weeks   ? Status New   ?  ? PT LONG TERM GOAL #7  ? Title Pt will improve FOTO to >/= 62%   ? Baseline 49%   ? Time 8   ? Period Weeks   ? Status New   ? ?  ?  ? ?  ? ? ? Plan  ? ? Clinical Impression Statement Continued today's skilled session focused on high level balance on unlevel/complaint surfaces, as well as adding addition of dual tasking for  further cognitive challenge. Pt tolerating activities well. Will continue per POC.  ?  ? Personal Factors and Comorbidities Comorbidity 2;Transportation   ? Comorbidities HLD, Prediabetes   ? Examination-Activity Limitations  Transfers;Stairs;Stand;Locomotion Level;Squat;Bend   ? Examination-Participation Restrictions Occupation;Cleaning;Community Activity;Driving   ? Stability/Clinical Decision Making Stable/Uncomplicated   ? Rehab Potential Good   ? PT Frequency 2x / week   ? PT Duration 8 weeks   plus eval  ? PT Treatment/Interventions ADLs/Self Care Home Management;Aquatic Therapy;Canalith Repostioning;Cryotherapy;Electrical Stimulation;Moist Heat;DME Instruction;Gait training;Stair training;Functional mobility training;Therapeutic activities;Therapeutic exercise;Balance training;Neuromuscular re-education;Patient/family education;Orthotic Fit/Training;Manual techniques;Passive range of motion;Vestibular;Energy conservation   ? PT Next Visit Plan Continue gait without AD with dual tasking, high-level balance, single leg stance, BLE strength, BLE coordination tasks , wall sits    ? PT Home Exercise Plan [email or web address]   ? Consulted and Agree with Plan of Care Patient   ? ?  ?  ? ?  ? ? ?Jones Bales, PT, DPT ?10/15/2021, 8:54 AM ? ?  ? ?

## 2021-10-15 NOTE — Patient Instructions (Signed)
[name], you are getting more agitated and lashing out more since the stroke ?  ?This happens and is somewhat normal. After a stroke You need to self advocate and let others know you aren't handling their big emotions, whether it is excited positive energy or big negative energy - the both can overwhelm your brain causing your frustration ?  ?Explain to people it is best if they speak calmly to you and not ask too many questions as your brain is trying to heal ?  ?This will be especially important at work  ?  ?Remember that you look great and people will forget that your brain is healing ?  ?Limit calls to 10-15 minutes  ?  ?  ?  ?Electronically signed by Cliffton Asters, CCC-SLP at 10/10/2021  9:29 AM ? ? ?

## 2021-10-17 ENCOUNTER — Encounter: Payer: 59 | Admitting: Occupational Therapy

## 2021-10-17 ENCOUNTER — Encounter: Payer: 59 | Admitting: Speech Pathology

## 2021-10-17 ENCOUNTER — Ambulatory Visit: Payer: 59 | Admitting: Physical Therapy

## 2021-10-22 ENCOUNTER — Ambulatory Visit: Payer: 59 | Admitting: Occupational Therapy

## 2021-10-22 ENCOUNTER — Ambulatory Visit: Payer: 59

## 2021-10-22 ENCOUNTER — Encounter: Payer: Self-pay | Admitting: Occupational Therapy

## 2021-10-22 VITALS — BP 112/73 | HR 60

## 2021-10-22 DIAGNOSIS — R41841 Cognitive communication deficit: Secondary | ICD-10-CM

## 2021-10-22 DIAGNOSIS — R41840 Attention and concentration deficit: Secondary | ICD-10-CM

## 2021-10-22 DIAGNOSIS — I69354 Hemiplegia and hemiparesis following cerebral infarction affecting left non-dominant side: Secondary | ICD-10-CM

## 2021-10-22 DIAGNOSIS — R41842 Visuospatial deficit: Secondary | ICD-10-CM

## 2021-10-22 DIAGNOSIS — R2681 Unsteadiness on feet: Secondary | ICD-10-CM

## 2021-10-22 DIAGNOSIS — M6281 Muscle weakness (generalized): Secondary | ICD-10-CM

## 2021-10-22 DIAGNOSIS — R2689 Other abnormalities of gait and mobility: Secondary | ICD-10-CM | POA: Diagnosis not present

## 2021-10-22 DIAGNOSIS — R278 Other lack of coordination: Secondary | ICD-10-CM

## 2021-10-22 NOTE — Patient Instructions (Addendum)
Regarding your agitation, practice your techniques (mumble to yourself, take 5 calming breathes, or walk away) with our family members. ? ?Write down any questions/concerns you have for the doctor so you don't forget  ? -Tingling  ? -Agitation ? ? ? ?

## 2021-10-22 NOTE — Therapy (Signed)
[facility], [place], [address] ?Phone: [phone]   Fax:  [phone] ? ?Speech Language Pathology Treatment/Discharge ? ?Patient Details  ?Name: [name] ?MRN: [id number] ?Date of Birth: 06-10-1961 ?Referring Provider (SLP): Kingsley Callander., MD ? ? ?Encounter Date: 10/22/2021 ? ? End of Session - 10/22/21 0859   ? ? Visit Number 13   ? Number of Visits 25   ? Date for SLP Re-Evaluation 11/29/21   ? SLP Start Time [phone]   ? SLP Stop Time  0941   ? SLP Time Calculation (min) 30 min   ? Activity Tolerance Patient tolerated treatment well   ? ?  ?  ? ?  ? ? ?Past Medical History:  ?Diagnosis Date  ? Blood in stool   ? Hyperlipidemia   ? Vaginal fibroids   ? ? ?History reviewed. No pertinent surgical history. ? ?There were no vitals filed for this visit. ? ? Subjective Assessment - 10/22/21 0908   ? ? Subjective "trying to handle multitasking"   ? Currently in Pain? No/denies   ? ?  ?  ? ?  ? ?SPEECH THERAPY DISCHARGE SUMMARY ? ?Visits from Start of Care: 13 ? ?Current functional level related to goals / functional outcomes: ?Per Primary SLP: ?Improving mild cognitive communication impairments. [name] is successfully carrying over compensations for attention, memory and processing including breaking tasks up into smaller parts, taking breaks, self advocating, and using lists. She verbalizes accommodations she may benefit from upon return to work with min A. She continues to report some difficulty alternating and dividing her attention but uses strategies for this.? Pt is pleased with current progress and requested ST discharge this date.  ? ?Remaining deficits: ?Higher level attention deficits, new onset agitation s/p stroke ?  ?Education / Equipment: ?Compensatory training, external aids, self-advocacy techniques  ? ?Patient agrees to discharge. Patient goals were partially met. Patient is being discharged due to being pleased with the  current functional level. ? ? ? ? ? ? ADULT SLP TREATMENT - 10/22/21 0827   ? ?  ? General Information  ? Behavior/Cognition Alert;Cooperative;Pleasant mood   ?  ? Treatment Provided  ? Treatment provided Cognitive-Linquistic   ?  ? Cognitive-Linquistic Treatment  ? Treatment focused on Cognition;Patient/family/caregiver education   ? Skilled Treatment Pt reports good carryover of cognitive lingusitic recommendations given ST education and training with primary SLP. Some ongoing agitation reported. SLP recommended f/u with MD re: new onset agtiation following CVA. Cognitive Function - Short Form PROM completed today with score of 22. SLP provided additional education re: recommendations and compensatory techniques to aid daily functioning. Handout provided. Pt verbalized understanding. Pt is pleased with current progress and requested ST discharge today.   ?  ? Assessment / Recommendations / Plan  ? Plan Discharge SLP treatment due to (comment)   pleased with current progress  ?  ? Progression Toward Goals  ? Progression toward goals Goals partially met, education completed, patient discharged from SLP   ? ?  ?  ? ?  ? ? ? SLP Education - 10/22/21 0859   ? ? Education Details discharge summary, SLP recommendations   ? Person(s) Educated Patient   ? Methods Explanation;Demonstration   ? Comprehension Verbalized understanding;Returned demonstration   ? ?  ?  ? ?  ? ? ? SLP Short Term Goals - 10/22/21 0826   ? ?  ? SLP SHORT TERM GOAL #1  ? Title Pt will  use comensatory strategies for memory successfully between 3 sessions   ? Baseline 09/19/21, 10/01/21   ? Status Achieved   ? Target Date 09/28/21   ?  ? SLP SHORT TERM GOAL #2  ? Title Pt will demonstrate adeuqate organizational skills to complete mod complex therapy tasks with modified independence (compensations) in 2 sessions   ? Baseline 10/01/21   ? Status Partially Met   ? Target Date 09/28/21   ?  ? SLP SHORT TERM GOAL #3  ? Title pt will demonstrate emergent  awareness during a therapy task in order to make corrections in 3 sessions   ? Baseline 09/25/30   ? Status Achieved   ? Target Date 09/28/21   ?  ? SLP SHORT TERM GOAL #4  ? Title pt will complete cognitive testing in first 1-2 sessions   ? Status Achieved   ? Target Date 09/14/21   ? ?  ?  ? ?  ? ? ? SLP Long Term Goals - 10/22/21 0827   ? ?  ? SLP LONG TERM GOAL #1  ? Title Pt will demonstrate adequate organizational skills to complete mod complex/complex therapy tasks with modified independence (compensations) in 4 sessions   ? Baseline 10/15/21;   ? Status Partially Met   ?  ? SLP LONG TERM GOAL #2  ? Title pt will demo anticipatory awareness by pre-planning/organizing a task prior to initiation, independently, in 3 sessions   ? Baseline 10/01/21; 10/08/21; 10/15/21   ? Status Achieved   ?  ? SLP LONG TERM GOAL #3  ? Title Pt will demonstrate adequate organizational skills to complete mod complex/complex therapy tasks with modified independence (compensations) in 4 sessions   ? Status Deferred   goal not addressed over last several weeks; deferred at discharge  ?  ? SLP LONG TERM GOAL #4  ? Title pt will independently use memory strategies for work-like tasks in 5 sessions   ? Baseline 10/01/21; 10/08/21; 10/15/21   ? Status Partially Met   ? ?  ?  ? ?  ? ? ? Plan - 10/22/21 0859   ? ? Clinical Impression Statement Improving mild cognitive communication impairments. [name] is successfully carrying over compensations for attention, memory and processing including breaking tasks up into smaller parts, taking breaks, self advocating, and using lists. She verbalizes accomodations she may benefit from upon return to work with min A. She continues to report some diffculty alternating and dividing her attention but uses strategies for this. Pt is pleased with current progress and agreeable to ST discharge on last scheduled visit.   ? Speech Therapy Frequency 2x / week   ? Duration 12 weeks   ? Treatment/Interventions Compensatory  techniques;Environmental controls;SLP instruction and feedback;Cueing hierarchy;Cognitive reorganization;Compensatory strategies;Patient/family education;Internal/external aids   ? Potential to Achieve Goals Good   ? ?  ?  ? ?  ? ? ?Patient will benefit from skilled therapeutic intervention in order to improve the following deficits and impairments:   ?Cognitive communication deficit ? ? ? ?Problem List ?Patient Active Problem List  ? Diagnosis Date Noted  ? Acute ischemic left PCA stroke ([place]) 08/03/2021  ? 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 Cerebral edema ([place]) 07/30/2021  ? Hyperlipidemia 07/30/2021  ? Prediabetes 07/30/2021  ? ? ?[name], CCC-SLP ?10/22/2021, 9:41 AM ? ?[facility] ?[place], [place], [address] ?Phone: [phone]   Fax:  [phone] ? ? ?Name: YUKARI FLAX ?MRN: [id number] ?  Date of Birth: [birth year] ? ?

## 2021-10-22 NOTE — Therapy (Signed)
?OUTPATIENT PHYSICAL THERAPY TREATMENT NOTE/DISCHARGE SUMMARY ? ? ?Patient Name: [name] ?MRN: [id number] ?DOB:[birth year], 61 y.o., female ?Today's Date: 10/22/2021 ? ?PCP: Jearld Fenton, NP ?REFERRING PROVIDER: Izora Ribas, MD  ? ?PHYSICAL THERAPY DISCHARGE SUMMARY ? ?Visits from Start of Care: 14 ? ?Current functional level related to goals / functional outcomes: ?See Clinical Impression Statements ?  ?Remaining deficits: ?None ?  ?Education / Equipment: ?HEP Provided  ? ?Patient agrees to discharge. Patient goals were met. Patient is being discharged due to meeting the stated rehab goals. ? ? PT End of Session - 10/22/21 0809   ? ? Visit Number 14   ? Number of Visits 17   ? Date for PT Re-Evaluation 11/23/21   pushed out due to scheduling  ? Authorization Type UHC (VL: 60 per discipline)   ? PT Start Time 0809   Pt arriving late  ? PT Stop Time [phone]   ? PT Time Calculation (min) 30 min   ? Activity Tolerance Patient tolerated treatment well   ? Behavior During Therapy [facility] for tasks assessed/performed   ? ?  ?  ? ?  ? ? ? ? ? ? ?Past Medical History:  ?Diagnosis Date  ? Blood in stool   ? Hyperlipidemia   ? Vaginal fibroids   ? ?No past surgical history on file. ?Patient Active Problem List  ? Diagnosis Date Noted  ? Acute ischemic left PCA stroke ([place]) 08/03/2021  ? 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 Cerebral edema ([place]) 07/30/2021  ? Hyperlipidemia 07/30/2021  ? Prediabetes 07/30/2021  ? ? ?REFERRING DIAG: I63.50 (ICD-10-CM) - Cerebral artery occlusion with cerebral infarction ([place])  ? ?THERAPY DIAG:  ?Other abnormalities of gait and mobility ? ?Muscle weakness (generalized) ? ?Unsteadiness on feet ? ?PERTINENT HISTORY: HLD, Prediabetes ? ?PRECAUTIONS: Fall ? ?SUBJECTIVE: No other new changes. Reports continues to drive with no issues. No falls to report.  ? ?PAIN:  ?Are you having pain? No ? ?Vitals:  ? 10/22/21 0813  ?BP: 112/73  ?Pulse: 60  ? ? ? ?TODAY'S TREATMENT:  ? ?STAIRS: ? Level of  Assistance: Complete Independence ? Stair Negotiation Technique: Alternating Pattern  with No Rails ? Number of Stairs: 12  ? Height of Stairs: 6  ?Comments: no instances of imbalance/unsteadiness noted with reciprocal pattern and no use of rails ? ?GAIT: ?Gait pattern: WFL ?Distance walked: throughout clinic with activities ?Assistive device utilized: None ?Level of assistance: Complete Independence ?Gait Speed: 8.72 secs = 3.76 ft/sec without use of AD ? ?  Outcome Measures:  ?  TUG: 6.72 sec's without AD.  ?  5x Sit to Stand: 9.22 secs without UE support ?    ? ? [facility] PT Assessment - 10/22/21 0001   ? ?  ? Standardized Balance Assessment  ? Standardized Balance Assessment Berg Balance Test   ?  ? Berg Balance Test  ? Sit to Stand Able to stand without using hands and stabilize independently   ? Standing Unsupported Able to stand safely 2 minutes   ? Sitting with Back Unsupported but Feet Supported on Floor or Stool Able to sit safely and securely 2 minutes   ? Stand to Sit Sits safely with minimal use of hands   ? Transfers Able to transfer safely, minor use of hands   ? Standing Unsupported with Eyes Closed Able to stand 10 seconds safely   ? Standing Unsupported with Feet Together Able to place feet together independently and stand 1 minute safely   ?  From Standing, Reach Forward with Outstretched Arm Can reach confidently >25 cm (10")   ? From Standing Position, Pick up Object from [place] to pick up shoe safely and easily   ? From Standing Position, Turn to Look Behind Over each Shoulder Looks behind from both sides and weight shifts well   ? Turn 360 Degrees Able to turn 360 degrees safely in 4 seconds or less   ? Standing Unsupported, Alternately Place Feet on Step/Stool Able to stand independently and safely and complete 8 steps in 20 seconds   ? Standing Unsupported, One Foot in [place] to place foot tandem independently and hold 30 seconds   ? Standing on One Leg Able to lift leg independently and  hold 5-10 seconds   ? Total Score 55   ? Merrilee Jansky comment: 55/56   ? ?  ?  ? ?  ? ?FOTO: Stroke LE: 79% (at Visit 14) ? ?Verbal Review of HEP: ?Access Code: [email or web address] ?URL: [email or web address] ?Date: 10/08/2021 ?Prepared by: Janann August ?  ?Exercises ?- Heel to toe walking   - 1 x daily - 7 x weekly - 3 sets - 3 reps ?- Backward heel to toe walking   - 1 x daily - 7 x weekly - 3 sets - 3 reps ?- Mini Squats with Walker and Chair  - 1 x daily - 7 x weekly - 2 sets - 10 reps ?- Standing Marching  - 1-2 x daily - 5 x weekly - 3 sets ?- Romberg Stance Eyes Closed on Foam Pad  - 1-2 x daily - 5 x weekly - 3 sets - 30 hold ?- Side Stepping with Resistance at Thighs  - 1-2 x daily - 5 x weekly - 3 sets ? ? ?PATIENT EDUCATION: ?Education details: Progress toward LTGs; HEP Compliance Upon d/c ?Person educated: Patient ?Education method: Explanation and Demonstration ?Education comprehension: verbalized understanding ? ? ?HOME EXERCISE PROGRAM: ?Access Code: [email or web address] ? ? PT Short Term Goals  ? ?  ? PT SHORT TERM GOAL #1  ? Title Pt will be indepdent with initial HEP for balance/strength   ? Baseline no HEP established   ? Time 4   ? Period Weeks   ? Status Achieved  ? Target Date 09/28/21   ?  ? PT SHORT TERM GOAL #2  ? Title Pt will improve TUG to </= 16 seconds with LRAD to demonstrate reduced fall risk   ? Baseline 22.72 secs with RW; 11.06 secs no AD  ? Time 4   ? Period Weeks   ? Status Achieved   ?  ? PT SHORT TERM GOAL #3  ? Title Patient will improve gait speed to >/= 2.0 ft/sec w/ LRAD to demo improved community mobility   ? Baseline 1.90 ft/sec; 2.35 ft/sec  ? Time 4   ? Period Weeks   ? Status Achieved  ?  ? PT SHORT TERM GOAL #4  ? Title Pt will improve Berg score to >42/56 for reduced risk of falls   ? Baseline 35/56; 51/56  ? Time 4   ? Period Weeks   ? Status Achieved  ?  ? PT SHORT TERM GOAL #5  ? Title Pt will be able to ambulate >/= 300 ft w/ LRAD and supervision to demo improved  household/community mobility   ? Baseline 120' CGA; 600' without AD with distant S* on 09/26/21   ? Time 4   ? Period Weeks   ? Status Achieved   ? ?  ?  ? ?  ? ? ?  PT Long Term Goals  ? ?  ? PT LONG TERM GOAL #1  ? Title Pt will be independent with final HEP for balance/strength   ? Baseline no HEP established; reports independence with HEP  ? Time 8   ? Period Weeks   ? Status Achieved  ? Target Date 10/26/21   ?  ? PT LONG TERM GOAL #2  ? Title Pt will perform >54/56 on Berg for improved dynamic balance and reduced fall risk   ? Baseline 35/56; 55/56  ? Time 8   ? Period Weeks   ? Status Achieved  ?  ? PT LONG TERM GOAL #3  ? Title Pt will improve TUG to </= 10 seconds w/ LRAD to demo improved balance   ? Baseline 22.72 secs w/ RW; 6.72 secs without AD  ? Time 8   ? Period Weeks   ? Status Achieved  ?  ? PT LONG TERM GOAL #4  ? Title Pt will improve 5x sit <> stand to </= 12 seconds to demo improved balance   ? Baseline 14.22 secs; 9.22 secs  ? Time 8   ? Period Weeks   ? Status Achieved  ?  ? PT LONG TERM GOAL #5  ? Title Pt will improve gait speed to >/= 2.6 ft/sec to demo improved community ambulator   ? Baseline 1.90 ft/sec with RW; 3.76 ft/sec without use of AD  ? Time 8   ? Period Weeks   ? Status Achieved  ?  ? PT LONG TERM GOAL #6  ? Title Pt will be able to ascend/descend 8 stairs with reciprocal pattern and supervision with use of single rail vs. no rail   ? Baseline step to pattern bil rails; 12 stairs with reciprocal  pattern and no rail Independent  ? Time 8   ? Period Weeks   ? Status Achieved  ?  ? PT LONG TERM GOAL #7  ? Title Pt will improve FOTO to >/= 62%   ? Baseline 49%; 79%  ? Time 8   ? Period Weeks   ? Status Achieved   ? ?  ?  ? ?  ? ? ? Plan  ? ? Clinical Impression Statement Completed assessment of patient's progress toward LTGs with patient able to meet all LTGs demonstrating significant progress with PT services. Patient is ambulating at 3.72 ft/sec without use of AD. Patient demo low  fall risk with TUG at 6.77 seconds, 5x sit to stand of 9.22 seconds and Berg Balance score of 55/56. Patient able to meet all LTGs at this time and demo readiness to d/c from PT services with patient agreeable.

## 2021-10-22 NOTE — Therapy (Signed)
?OUTPATIENT OCCUPATIONAL THERAPY TREATMENT NOTE & DISCHARGE ? ? ?Patient Name: [name] ?MRN: [id number] ?DOB:10/31/1960, 61 y.o., female ?Today's Date: 10/22/2021 ? ?PCP: Baity, Regina W, NP ?REFERRING PROVIDER: Baity, Regina W, NP ? ? OT End of Session - 10/22/21 0848   ? ? Visit Number 14   ? Number of Visits 17   ? Date for OT Re-Evaluation 11/09/21   ? Authorization Type UHC   ? Authorization Time Period VL: 60 combined (hard max), No Auth Req'd   ? Authorization - Visit Number 14   ? Authorization - Number of Visits 20   ? OT Start Time 0847   ? OT Stop Time 0915   ? OT Time Calculation (min) 28 min   ? Activity Tolerance Patient tolerated treatment well   ? Behavior During Therapy WFL for tasks assessed/performed   ? ?  ?  ? ?  ? ?OCCUPATIONAL THERAPY DISCHARGE SUMMARY ? ?Visits from Start of Care: 14 ? ?Current functional level related to goals / functional outcomes: ?Pt has met all goals and is functioning at a mod I level. ?  ?Remaining deficits: ?Continues with some difficulty with higher level processing and problem solving ?  ?Education / Equipment: ?HEPs and strategies  ? ?Patient agrees to discharge. Patient goals were met. Patient is being discharged due to meeting the stated rehab goals.. ? ? ? ? ?Past Medical History:  ?Diagnosis Date  ? Blood in stool   ? Hyperlipidemia   ? Vaginal fibroids   ? ?History reviewed. No pertinent surgical history. ?Patient Active Problem List  ? Diagnosis Date Noted  ? Acute ischemic left PCA stroke ([facility]) 08/03/2021  ? Cerebellar infarction ([facility]) 07/30/2021  ? Cerebral edema ([facility]) 07/30/2021  ? Hyperlipidemia 07/30/2021  ? Prediabetes 07/30/2021  ? ? ?ONSET DATE: 07/28/21 ? ?REFERRING DIAG: L CVA ? ?THERAPY DIAG:  ?Other abnormalities of gait and mobility ? ?Other lack of coordination ? ?Muscle weakness (generalized) ? ?Unsteadiness on feet ? ?Attention and concentration deficit ? ?Hemiplegia and hemiparesis following cerebral infarction affecting left non-dominant  side ([facility]) ? ?Visuospatial deficit ? ? ?PERTINENT HISTORY: hyperlipidemia,  prediabetes and tobacco use. ? ?PRECAUTIONS: Fall Risk ? ?SUBJECTIVE: "I've come a long ways" ? ?PAIN:  ?Are you having pain? No ? ? ? ? ?OBJECTIVE:  ? ?TODAY'S TREATMENT:  ? ?10/22/21 ?Sudoku pt took a few puzzles home and has bought a book - working through the puzzles and solved all the take home ones. Strategies provided for taking breaks and small sections at a time. ? ?[place] beginner levels with mod cues for getting started and breaking it down for problem solving but was able to complete next 2 puzzles independently.  ? ? ? ?10/15/21 ?Sudoku (beginner level) with max cues for how to solve puzzle and instruction at beginning. Pt continued to solve puzzle and req'd mod cues for strategies. Increased time. ? ?Constant Therapy Alphabetize Alternating Words level 1 with 90% accuracy and 68.6s response time. ? ? ? ? ? OT Short Term Goals -  ALL STGs MET   ? ?  ? OT SHORT TERM GOAL #1  ? Title Pt will be independent with initial HEP   ? Time 4   ? Period Weeks   ? Status Achieved   independent with cooridnation and putty HEP 09/12/21  ? Target Date 09/28/21   ?  ? OT SHORT TERM GOAL #2  ? Title Pt will verbalize understanding of memory compensatory strategies   ? Time   4   ? Period Weeks   ? Status Achieved   using memory notebook and cell phone 09/12/21  ?  ? OT SHORT TERM GOAL #3  ? Title Pt will perform environmental scanning in moderately distracting environment with 90% accuracy or greater.   ? Time 4   ? Period Weeks   ? Status Achieved   93% accuracy 09/12/21  ?  ? OT SHORT TERM GOAL #4  ? Title Pt will increase grip strength by 5 lbs or greater in LUE.   ? Baseline L 19.6, R 35   ? Time 4   ? Period Weeks   ? Status Achieved   38.8 lbs LUE 09/12/21  ?  ? OT SHORT TERM GOAL #5  ? Title Pt will improve 9 hole peg test by 3 seconds or more in BUE for increase in fine motor coordination and processing speed   ? Baseline R 54.37s, L 45.37s   ?  Time 4   ? Period Weeks   ? Status Achieved   40.59s with LUE 09/12/21  ? ?  ?  ? ?  ? ? ? OT Long Term Goals   ? ?  ? OT LONG TERM GOAL #1  ? Title Pt will be independent with updated HEP   ? Time 10   ? Period Weeks   ? Status Achieved  ? Target Date 11/09/21   ?  ? OT LONG TERM GOAL #2  ? Title Pt will increase 9 hole peg test score with BUE by 8 seconds or greater in order to increase fine motor coordination.   ? Baseline R 54.37s, L 45.37s,    3/15 - R - 41.06,  L 32.81   ? Time 10   ? Period Weeks   ? Status Achieved   ?  ? OT LONG TERM GOAL #3  ? Title Pt will be increase grip strength in LUE by 10 lbs or greater   ? Baseline L 19.6, R 35   ? Time 10   ? Period Weeks   ? Status Achieved   38.8 lbs LUE 09/12/21  ?  ? OT LONG TERM GOAL #4  ? Title Pt will complete alternating attention task with 90% accuracy or greater and with appropriate time.   ? Baseline Trail Making B > 5 minutes with 100% accuracy   ? Time 10   ? Period Weeks   ? Status Achieved  ?  ? OT LONG TERM GOAL #5  ? Title Pt will increase shoulder range of motion in LUE to 130 degrees or greater for increasing ability for overhead reach.   ? Baseline 125 degreees   ? Time 10   ? Period Weeks   ? Status Achieved   ?  ? OT LONG TERM GOAL #6  ? Title Complete FOTO at discharge with score of 70% or greater.   ? Baseline 62%   ? Time 10   ? Period Weeks   ? Status Achieved 82%  ? ?  ?  ? ?  ? ? ? Plan   ? ? Clinical Impression Statement Pt has met all goals and is discharging from OT at this time.  ? OT Occupational Profile and History Problem Focused Assessment - Including review of records relating to presenting problem   ? Occupational performance deficits (Please refer to evaluation for details): ADL's;IADL's;Leisure;Work   ? Body Structure / Function / Physical Skills ADL;Decreased knowledge of precautions;Coordination;Decreased knowledge of use of   DME;Strength;GMC;IADL;ROM;FMC;UE functional use;Dexterity;Flexibility;Vision   ? Cognitive Skills  Attention;Problem Solve   ? Rehab Potential Good   ? Clinical Decision Making Limited treatment options, no task modification necessary   ? Comorbidities Affecting Occupational Performance: None   ? Modification or Assistance to Complete Evaluation  No modification of tasks or assist necessary to complete eval   ? OT Frequency 2x / week   ? OT Duration Other (comment)   16 visits over 10 weeks  ? OT Treatment/Interventions Aquatic Therapy;Self-care/ADL training;Moist Heat;Fluidtherapy;DME and/or AE instruction;Therapeutic activities;Therapeutic exercise;Cognitive remediation/compensation;Visual/perceptual remediation/compensation;Passive range of motion;Neuromuscular education;Functional Mobility Training;Energy conservation;Patient/family education   ? Plan OT d/c  ? OT Home Exercise Plan FM Coordination issued 09/05/21   ? Consulted and Agree with Plan of Care Patient   ? ?  ?  ? ?  ? ? ? ? ?Zachery Conch, OT ?10/22/2021, 9:14 AM ? ?  ? ? ? ?

## 2021-10-24 ENCOUNTER — Telehealth: Payer: Self-pay | Admitting: *Deleted

## 2021-10-24 NOTE — Telephone Encounter (Signed)
Ms [name] called and asks when she can go back to work? Her disability runs out on the 14th but she does not return for appt until 03/12/22. ?

## 2021-10-25 ENCOUNTER — Encounter: Payer: 59 | Attending: Physical Medicine and Rehabilitation | Admitting: Physical Medicine and Rehabilitation

## 2021-10-25 DIAGNOSIS — I63532 Cerebral infarction due to unspecified occlusion or stenosis of left posterior cerebral artery: Secondary | ICD-10-CM | POA: Insufficient documentation

## 2021-10-25 NOTE — Progress Notes (Signed)
? ?Subjective:  ? ? Patient ID: [name], female    DOB: 1960/09/28, 61 y.o.   MRN: [id number] ? ?HPI ?An audio/video tele-health visit is felt to be the most appropriate encounter for this patient at this time. This is a follow up tele-visit via phone. The patient is at home. MD is at office. Prior to scheduling this appointment, our staff discussed the limitations of evaluation and management by telemedicine and the availability of in-person appointments. The patient expressed understanding and agreed to proceed.  ? ?1) CVA ?-using walker in the halls but her hallways are too small so she often has to use the walls.  ?-she uses the rolling walker outside ?-she has been able to throw trash and start fixing breakfasts ?-boy friend has been fixing her other meals. ?-she feels ready to return to work ?-she works at night ?-she usually works 10-12 hours and feels she would be able to start at 7-8 hour shifts ?-she needs a document to be sent to her case manager with any restrictions ?-she has an eye doctor appointment on Monday ? ?2) Red eyes ?-went to the eye doctor and got neomycin and this helps ? ?3) Anxiety: ?-felt like she couldn't focus when she was in the grocery store ? ?4) Dizziness ?-more present when she bends forward to look downward.  ?-associated with blurry vision ? ? ? ?Pain Inventory ?Average Pain 3 ?Pain Right Now 3 ?My pain is intermittent and soreness ? ?LOCATION OF PAIN  neck ? ?BOWEL ?Number of stools per week: 7 ? ? ?BLADDER ?Normal ?I ? ? ?Mobility ?use a walker ?how many minutes can you walk? unknown ?ability to climb steps?  yes ?do you drive?  no ?Do you have any goals in this area?  yes ? ?Function ?employed # of hrs/week on medical leave since 2/12/223 ?I need assistance with the following:  shopping ?Do you have any goals in this area?  yes ? ?Neuro/Psych ?trouble walking ?dizziness ?confusion ? ?Prior Studies ?Any changes since last visit?  no ? ?Physicians involved in your care ?Any  changes since last visit?  no ? ? ?Family History  ?Problem Relation Age of Onset  ? Diabetes Father   ? Hypertension Father   ? Heart disease Father   ? Hyperlipidemia Father   ? Stroke Father   ? Cancer Neg Hx   ? ?Social History  ? ?Socioeconomic History  ? Marital status: Single  ?  Spouse name: Not on file  ? Number of children: Not on file  ? Years of education: 46  ? Highest education level: Not on file  ?Occupational History  ?  Employer: [employer]  ?Tobacco Use  ? Smoking status: Former  ? Smokeless tobacco: Not on file  ?Vaping Use  ? Vaping Use: Never used  ?Substance and Sexual Activity  ? Alcohol use: No  ? Drug use: No  ? Sexual activity: Yes  ?  Birth control/protection: Condom  ?Other Topics Concern  ? Not on file  ?Social History Narrative  ? Regular exercise-yes  ? Caffeine Use-yes  ? ?Social Determinants of Health  ? ?Financial Resource Strain: Not on file  ?Food Insecurity: Not on file  ?Transportation Needs: Not on file  ?Physical Activity: Not on file  ?Stress: Not on file  ?Social Connections: Not on file  ? ?No past surgical history on file. ?Past Medical History:  ?Diagnosis Date  ? Blood in stool   ? Hyperlipidemia   ? Vaginal  fibroids   ? ?LMP 07/15/2009  ? ?Opioid Risk Score:   ?Fall Risk Score:  `1 ? ?Depression screen PHQ 2/9 ? ? ?  09/10/2021  ? 10:50 AM 06/17/2012  ? 11:14 AM  ?Depression screen PHQ 2/9  ?Decreased Interest 3 0  ?Down, Depressed, Hopeless 2 0  ?PHQ - 2 Score 5 0  ?Altered sleeping 1   ?Tired, decreased energy 2   ?Change in appetite 0   ?Feeling bad or failure about yourself  2   ?Trouble concentrating 2   ?Moving slowly or fidgety/restless 2   ?Suicidal thoughts 0   ?PHQ-9 Score 14   ? ? ?Review of Systems  ?Musculoskeletal:  Positive for gait problem and neck pain.  ?Neurological:  Positive for dizziness.  ?Psychiatric/Behavioral:  Positive for confusion.   ?All other systems reviewed and are negative. ? ?   ?Objective:  ? Physical Exam ?Not performed as patient  was seen via phone ? ?   ?Assessment & Plan:  ? ?1) CVA ?-continue HEP ?-would benefit from handicap placard to increase mobility in the community ?-reviewed all medications and provided necessary refills. ?-completed form listing her acommodations for work ?-discussed plan for 7-8 hours per shift right now with rest breaks as needed ?-we have sent form to her case manager ?-start Monday after neuro-ophthalmologist appointment ?-advised her to let me know how work is going after she starts and if she needs further acommodations ? ?2) Blurry vision ?-discussed potential CN 4 dysfunction ?-referred to neuro-ophthalmology ? ?3) HLD ?-refilled crestor ?-reviewed lipid panel with her ?Provided with list of supplements that can help with dyslipidemia: ?1) Vitamin B3 500-4,'000mg'$  in divided doses daily (would recommend starting low as can cause uncomfortable facial flushing if started at too high a dose) ?2) Phytosterols 2.15 grams daily ?3) Fermented soy 30-50 grams daily ?4) EGCG (found in green tea): 500-'1000mg'$  daily ?5) Omega-3 fatty acids 3000-5,'000mg'$  daily ?6) Flax seed 40 grams daily ?7) Monounsaturated fats 20-40 grams daily (olives, olive oil, nuts), also reduces cardiovascular disease ?8) Sesame: 40 grams daily ?9) Gamma/delta tocotrienols- a family of unsaturated forms of Vitamin E- '200mg'$  with dinner ?10) Pantethine '900mg'$  daily in divided doses ?11) Resveratrol '250mg'$  daily ?12) N Acetyl Cysteine '2000mg'$  daily in divided doses ?13) Curcumin 2000-'5000mg'$  in divided doses daily ?14) Pomegranate juice: 8 ounces daily, also helps to lower blood pressure ?15) Pomegranate seeds one cup daily, also helps to lower blood pressure ?16) Citrus Bergamot '1000mg'$  daily, also helps with glucose control and weight loss ?17) Vitamin C '500mg'$  daily ?18) Quercetin 500-'1000mg'$  daily ?19) Glutathione ?20) Probiotics 60-100 billion organisms per day ?21) Fiber ?22) Oats ?23) Aged garlic (can eat as food or supplement of 600-'900mg'$  per  day) ?24) Chia seeds 25 grams per day ?25) Lycopene- carotenoid found in high concentrations in tomatoes. ?26) Alpha linolenic acid ?27) Flavonoids and anthocyanins ?28) Wogonin- flavanoid that enhances reverse cholesterol transport ?29) Coenzyme Q10 ?30) Pantethine- derivative of Vitamin B5: '300mg'$  three times per day or '450mg'$  twice per day with or without food ?31) Barley and other whole grains ?32) Orange juice ?33) L- carnitine ?34) L- Lysine ?35) L- Arginine ?36) Almonds ?2) Morin ?38) Rutin ?39) Carnosine ?40) Histidine  ?41) Kaempferol  ?42) Organosulfur compounds ?43) Vitamin E ?44) Oleic acid ?45) RBO (ferulic acid gammaoryzanol) ?46) grape seed extract ?47) Red wine ?48) Berberine HCL '500mg'$  daily or twice per day- more effective and with fewer adverse effects that ezetimibe monotherapy ?49) red yeast rice  2400- 4800 mg/day ?50) chlorella ?51) Licorice  ? ?5 minutes spent in discussion of the type of work she does, her typical length of work day, how much she feels she can handle at this time, what accommodations she needs, discussion that I will complete her form for her ?

## 2021-10-29 ENCOUNTER — Ambulatory Visit (INDEPENDENT_AMBULATORY_CARE_PROVIDER_SITE_OTHER): Payer: 59 | Admitting: Neurology

## 2021-10-29 ENCOUNTER — Other Ambulatory Visit: Payer: Self-pay | Admitting: Internal Medicine

## 2021-10-29 ENCOUNTER — Encounter: Payer: Self-pay | Admitting: Neurology

## 2021-10-29 VITALS — BP 123/73 | HR 61 | Ht 63.0 in | Wt 153.8 lb

## 2021-10-29 DIAGNOSIS — I639 Cerebral infarction, unspecified: Secondary | ICD-10-CM

## 2021-10-29 DIAGNOSIS — I6503 Occlusion and stenosis of bilateral vertebral arteries: Secondary | ICD-10-CM

## 2021-10-29 DIAGNOSIS — Z1231 Encounter for screening mammogram for malignant neoplasm of breast: Secondary | ICD-10-CM

## 2021-10-29 DIAGNOSIS — E782 Mixed hyperlipidemia: Secondary | ICD-10-CM

## 2021-10-29 NOTE — Patient Instructions (Signed)
I had a long d/w patient about her recent stroke and vertebral artery stenosis,, risk for recurrent stroke/TIAs, personally independently reviewed imaging studies and stroke evaluation results and answered questions.Continue aspirin 81 mg daily  for secondary stroke prevention and maintain strict control of hypertension with blood pressure goal below 130/90, diabetes with hemoglobin A1c goal below 6.5% and lipids with LDL cholesterol goal below 70 mg/dL. I also advised the patient to eat a healthy diet with plenty of whole grains, cereals, fruits and vegetables, exercise regularly and maintain ideal body weight Followup in the future with my nurse practitioner in 6 months or call earlier if needed. ?Stroke Prevention ?Some medical conditions and behaviors can lead to a higher chance of having a stroke. You can help prevent a stroke by eating healthy, exercising, not smoking, and managing any medical conditions you have. ?Stroke is a leading cause of functional impairment. Primary prevention is particularly important because a majority of strokes are first-time events. Stroke changes the lives of not only those who experience a stroke but also their family and other caregivers. ?How can this condition affect me? ?A stroke is a medical emergency and should be treated right away. A stroke can lead to brain damage and can sometimes be life-threatening. If a person gets medical treatment right away, there is a better chance of surviving and recovering from a stroke. ?What can increase my risk? ?The following medical conditions may increase your risk of a stroke: ?Cardiovascular disease. ?High blood pressure (hypertension). ?Diabetes. ?High cholesterol. ?Sickle cell disease. ?Blood clotting disorders (hypercoagulable state). ?Obesity. ?Sleep disorders (obstructive sleep apnea). ?Other risk factors include: ?Being older than age 57. ?Having a history of blood clots, stroke, or mini-stroke (transient ischemic attack,  TIA). ?Genetic factors, such as race, ethnicity, or a family history of stroke. ?Smoking cigarettes or using other tobacco products. ?Taking birth control pills, especially if you also use tobacco. ?Heavy use of alcohol or drugs, especially cocaine and methamphetamine. ?Physical inactivity. ?What actions can I take to prevent this? ?Manage your health conditions ?High cholesterol levels. ?Eating a healthy diet is important for preventing high cholesterol. If cholesterol cannot be managed through diet alone, you may need to take medicines. ?Take any prescribed medicines to control your cholesterol as told by your health care provider. ?Hypertension. ?To reduce your risk of stroke, try to keep your blood pressure below 130/80. ?Eating a healthy diet and exercising regularly are important for controlling blood pressure. If these steps are not enough to manage your blood pressure, you may need to take medicines. ?Take any prescribed medicines to control hypertension as told by your health care provider. ?Ask your health care provider if you should monitor your blood pressure at home. ?Have your blood pressure checked every year, even if your blood pressure is normal. Blood pressure increases with age and some medical conditions. ?Diabetes. ?Eating a healthy diet and exercising regularly are important parts of managing your blood sugar (glucose). If your blood sugar cannot be managed through diet and exercise, you may need to take medicines. ?Take any prescribed medicines to control your diabetes as told by your health care provider. ?Get evaluated for obstructive sleep apnea. Talk to your health care provider about getting a sleep evaluation if you snore a lot or have excessive sleepiness. ?Make sure that any other medical conditions you have, such as atrial fibrillation or atherosclerosis, are managed. ?Nutrition ?Follow instructions from your health care provider about what to eat or drink to help manage your health  condition. These instructions may include: ?Reducing your daily calorie intake. ?Limiting how much salt (sodium) you use to 1,500 milligrams (mg) each day. ?Using only healthy fats for cooking, such as olive oil, canola oil, or sunflower oil. ?Eating healthy foods. You can do this by: ?Choosing foods that are high in fiber, such as whole grains, and fresh fruits and vegetables. ?Eating at least 5 servings of fruits and vegetables a day. Try to fill one-half of your plate with fruits and vegetables at each meal. ?Choosing lean protein foods, such as lean cuts of meat, poultry without skin, fish, tofu, beans, and nuts. ?Eating low-fat dairy products. ?Avoiding foods that are high in sodium. This can help lower blood pressure. ?Avoiding foods that have saturated fat, trans fat, and cholesterol. This can help prevent high cholesterol. ?Avoiding processed and prepared foods. ?Counting your daily carbohydrate intake. ? ?Lifestyle ?If you drink alcohol: ?Limit how much you have to: ?0-1 drink a day for women who are not pregnant. ?0-2 drinks a day for men. ?Know how much alcohol is in your drink. In the [place] one drink equals one 12 oz bottle of beer (336m), one 5 oz glass of wine (1424m, or one 1? oz glass of hard liquor (4436m ?Do not use any products that contain nicotine or tobacco. These products include cigarettes, chewing tobacco, and vaping devices, such as e-cigarettes. If you need help quitting, ask your health care provider. ?Avoid secondhand smoke. ?Do not use drugs. ?Activity ? ?Try to stay at a healthy weight. ?Get at least 30 minutes of exercise on most days, such as: ?Fast walking. ?Biking. ?Swimming. ?Medicines ?Take over-the-counter and prescription medicines only as told by your health care provider. Aspirin or blood thinners (antiplatelets or anticoagulants) may be recommended to reduce your risk of forming blood clots that can lead to stroke. ?Avoid taking birth control pills. Talk to your health  care provider about the risks of taking birth control pills if: ?You are over 35 71ars old. ?You smoke. ?You get very bad headaches. ?You have had a blood clot. ?Where to find more information ?American Stroke Association: www.strokeassociation.org ?Get help right away if: ?You or a loved one has any symptoms of a stroke. "BE FAST" is an easy way to remember the main warning signs of a stroke: ?B - Balance. Signs are dizziness, sudden trouble walking, or loss of balance. ?E - Eyes. Signs are trouble seeing or a sudden change in vision. ?F - Face. Signs are sudden weakness or numbness of the face, or the face or eyelid drooping on one side. ?A - Arms. Signs are weakness or numbness in an arm. This happens suddenly and usually on one side of the body. ?S - Speech. Signs are sudden trouble speaking, slurred speech, or trouble understanding what people say. ?T - Time. Time to call emergency services. Write down what time symptoms started. ?You or a loved one has other signs of a stroke, such as: ?A sudden, severe headache with no known cause. ?Nausea or vomiting. ?Seizure. ?These symptoms may represent a serious problem that is an emergency. Do not wait to see if the symptoms will go away. Get medical help right away. Call your local emergency services (911 in the [place] Do not drive yourself to the hospital. ?Summary ?You can help to prevent a stroke by eating healthy, exercising, not smoking, limiting alcohol intake, and managing any medical conditions you may have. ?Do not use any products that contain nicotine or tobacco. These include cigarettes, chewing  tobacco, and vaping devices, such as e-cigarettes. If you need help quitting, ask your health care provider. ?Remember "BE FAST" for warning signs of a stroke. Get help right away if you or a loved one has any of these signs. ?This information is not intended to replace advice given to you by your health care provider. Make sure you discuss any questions you have  with your health care provider. ?Document Revised: 01/31/2020 Document Reviewed: 01/31/2020 ?Elsevier Patient Education ? [place]. ? ?

## 2021-10-29 NOTE — Progress Notes (Signed)
?[facility] ?[address] ?[place]. [id number] ?[phone] ? ?     OFFICE FOLLOW-UP NOTE ? ?[name] ?Date of Birth:  [birth year] ?Medical Record Number:  [id number]  ? ?HPI: [name] is a pleasant 61 year old African-American lady seen today for initial office visit following hospital admission for stroke in January 2023.  History is obtained from the patient and review of electronic medical records and I personally reviewed pertinent available imaging films in PACS. [name] is a 61 y.o. female with a medical history significant for hyperlipidemia, tobacco use, and vaginal fibroids who was seen on 07/28/21 at urgent care for complaints of headache, nausea and vomiting, and fever without relief with BC powder at home and during evaluation she came lightheaded and was sent to the emergency room for evaluation of dehydration. On evaluation in the ED, patient was complaining of lethargy, nausea and vomiting without response to medication.  CT head was obtained without acute abnormality. She was also noted to be unsteady with standing with a staggering gait and she was sent to [facility] for evaluation of possible central process. MRI brain was obtained on arrival to [facility] revealing acute infarcts in the right cerebellum and left occipital cortex suggesting posterior circulation emboli and neurology was consulted for further evaluation.  Patient presented outside time window for thrombolysis did not have an LVO for thrombectomy.  CT angiogram of the head and neck showed high-grade narrowing of the nondominant right vertebral 70% at the origin and greater at the V2 segment.  There was 55% narrowing of the left vertebral origin.  2D echo showed ejection fraction of 55 to 60%.  Follow-up repeat CT scan showed increased cytotoxic edema the patient was admitted to the ICU and started on hypertonic daily which remained stable and did not need any neurosurgical intervention.  LDL  cholesterol was elevated at 161 mg percent.  Hemoglobin A1c was 6.1.  Echocardiogram showed ejection fraction of 65 to 70%.  Patient was started on aspirin and has done well.  Quickly recovered.  Plans to go back to work part-time today and hopefully to prevent stone.  Patient had trouble tolerating Crestor developed some eyebrow rashes and her primary care physician switched her to Repatha injections which she is tolerating well without side effects.  She is tolerating aspirin without bleeding or bruising.  Blood pressure is good today it is 123/73.  She has no new complaints. ? ?Past Medical History:  ?Diagnosis Date  ? Blood in stool   ? Hyperlipidemia   ? Vaginal fibroids   ? ? ?History reviewed. No pertinent surgical history. ? ?Family History  ?Problem Relation Age of Onset  ? Diabetes Father   ? Hypertension Father   ? Heart disease Father   ? Hyperlipidemia Father   ? Stroke Father   ? Cancer Neg Hx   ? ? ?Social History  ? ?Socioeconomic History  ? Marital status: Single  ?  Spouse name: Not on file  ? Number of children: Not on file  ? Years of education: 74  ? Highest education level: Not on file  ?Occupational History  ?  Employer: [employer]  ?Tobacco Use  ? Smoking status: Former  ? Smokeless tobacco: Not on file  ?Vaping Use  ? Vaping Use: Never used  ?Substance and Sexual Activity  ? Alcohol use: No  ? Drug use: No  ? Sexual activity: Yes  ?  Birth control/protection: Condom  ?Other Topics Concern  ?  Not on file  ?Social History Narrative  ? Regular exercise-yes  ? Caffeine Use-yes  ? ?Social Determinants of Health  ? ?Financial Resource Strain: Not on file  ?Food Insecurity: Not on file  ?Transportation Needs: Not on file  ?Physical Activity: Not on file  ?Stress: Not on file  ?Social Connections: Not on file  ?Intimate Partner Violence: Not on file  ? ? ?Outpatient Medications Prior to Visit  ?Medication Sig Dispense Refill  ? acetaminophen (TYLENOL) 325 MG tablet Take 2 tablets (650 mg total)  by mouth every 6 (six) hours as needed for mild pain (or Fever >/= 101).    ? aspirin 81 MG EC tablet Take 1 tablet (81 mg total) by mouth daily. Swallow whole. 90 tablet 11  ? Evolocumab (REPATHA [place]) Inject into the skin.    ? magnesium gluconate (MAGONATE) 500 MG tablet Take 0.5 tablets (250 mg total) by mouth at bedtime. 30 tablet 0  ? neomycin-polymyxin b-dexamethasone (MAXITROL) 3.5-10000-0.1 SUSP 1 drop 4 (four) times daily.    ? senna-docusate (SENOKOT-S) 8.6-50 MG tablet Take 1 tablet by mouth 2 (two) times daily.    ? Tetrahydroz-Dextran-PEG-Povid (EYE DROPS ADVANCED RELIEF) 0.05-0.1-1-1 % SOLN Apply 1 drop to eye 2 (two) times daily. 8 mL 0  ? Vitamin D, Ergocalciferol, (DRISDOL) 1.25 MG (50000 UNIT) CAPS capsule Take 1 capsule (50,000 Units total) by mouth every 7 (seven) days. 5 capsule 0  ? rosuvastatin (CRESTOR) 20 MG tablet Take 1 tablet (20 mg total) by mouth daily. 90 tablet 3  ? ?No facility-administered medications prior to visit.  ? ? ?Allergies  ?Allergen Reactions  ? Crestor [Rosuvastatin] Itching  ?  Hair loss of eyebrows  ? Amoxicillin-Pot Clavulanate Itching and Rash  ? ? ?ROS ?Review of Systems ? ?  ?Objective:  ?  ?Physical Exam ? ?BP 123/73   Pulse 61   Ht '5\' 3"'$  (1.6 m)   Wt 153 lb 12.8 oz (69.8 kg)   LMP 07/15/2009   BMI 27.24 kg/m?  ?Wt Readings from Last 3 Encounters:  ?10/29/21 153 lb 12.8 oz (69.8 kg)  ?09/10/21 154 lb (69.9 kg)  ?08/03/21 152 lb 9.6 oz (69.2 kg)  ? ? ? ?Health Maintenance Due  ?Topic Date Due  ? Hepatitis C Screening  Never done  ? COLONOSCOPY (Pts 45-67yr Insurance coverage will need to be confirmed)  Never done  ? Zoster Vaccines- Shingrix (1 of 2) Never done  ? PAP SMEAR-Modifier  08/22/2013  ? COVID-19 Vaccine (3 - Booster for Pfizer series) 01/11/2020  ? ? ?There are no preventive care reminders to display for this patient. ? ?Lab Results  ?Component Value Date  ? TSH 0.524 07/30/2021  ? ?Lab Results  ?Component Value Date  ? WBC 6.8 08/06/2021  ? HGB  14.6 08/06/2021  ? HCT 43.4 08/06/2021  ? MCV 91.2 08/06/2021  ? PLT 228 08/06/2021  ? ?Lab Results  ?Component Value Date  ? NA 139 08/06/2021  ? K 4.4 08/06/2021  ? CO2 29 08/06/2021  ? GLUCOSE 95 08/06/2021  ? BUN 13 08/06/2021  ? CREATININE 0.87 08/06/2021  ? BILITOT 0.5 08/06/2021  ? ALKPHOS 93 08/06/2021  ? AST 24 08/06/2021  ? ALT 25 08/06/2021  ? PROT 7.7 08/06/2021  ? ALBUMIN 3.6 08/06/2021  ? CALCIUM 9.5 08/06/2021  ? ANIONGAP 9 08/06/2021  ? GFR 118.18 06/17/2012  ? ?Lab Results  ?Component Value Date  ? CHOL 215 (H) 07/29/2021  ? ?Lab Results  ?Component Value Date  ?  HDL 39 (L) 07/29/2021  ? ?Lab Results  ?Component Value Date  ? LDLCALC 161 (H) 07/29/2021  ? ?Lab Results  ?Component Value Date  ? TRIG 76 07/29/2021  ? ?Lab Results  ?Component Value Date  ? CHOLHDL 5.5 07/29/2021  ? ?Lab Results  ?Component Value Date  ? HGBA1C 6.1 (H) 07/29/2021  ? ? ?  ?Assessment & Plan:  ? ?Problem List Items Addressed This Visit   ? ? Cerebellar infarction [facility]) - Primary  ? Hyperlipidemia  ? Relevant Medications  ? Evolocumab (REPATHA [place])  ? ?Other Visit Diagnoses   ? ? Vertebral artery stenosis/occlusion, bilateral      ? Relevant Medications  ? Evolocumab (REPATHA [place])  ? ?  ? ? ?No orders of the defined types were placed in this encounter. ? ? ?Follow-up: Return in about 6 months (around 04/30/2022).  ? ? ?Antony Contras, MDdid not need any neurosurgical intervention ? ?ROS:   ?14 system review of systems is positive for dizziness, eyebrow rash, off-balance and all other systems negative ? ?PMH:  ?Past Medical History:  ?Diagnosis Date  ? Blood in stool   ? Hyperlipidemia   ? Vaginal fibroids   ? ? ?Social History:  ?Social History  ? ?Socioeconomic History  ? Marital status: Single  ?  Spouse name: Not on file  ? Number of children: Not on file  ? Years of education: 16  ? Highest education level: Not on file  ?Occupational History  ?  Employer: [employer]  ?Tobacco Use  ? Smoking status: Former  ? Smokeless  tobacco: Not on file  ?Vaping Use  ? Vaping Use: Never used  ?Substance and Sexual Activity  ? Alcohol use: No  ? Drug use: No  ? Sexual activity: Yes  ?  Birth control/protection: Condom  ?Other Topics Con

## 2021-11-29 ENCOUNTER — Ambulatory Visit
Admission: RE | Admit: 2021-11-29 | Discharge: 2021-11-29 | Disposition: A | Discharge status: Home / Self Care | Payer: 59 | Source: Ambulatory Visit | Attending: Internal Medicine | Admitting: Internal Medicine

## 2021-11-29 DIAGNOSIS — Z1231 Encounter for screening mammogram for malignant neoplasm of breast: Secondary | ICD-10-CM

## 2022-03-12 ENCOUNTER — Encounter: Payer: 59 | Attending: Physical Medicine and Rehabilitation | Admitting: Physical Medicine and Rehabilitation

## 2022-03-12 ENCOUNTER — Encounter: Payer: Self-pay | Admitting: Physical Medicine and Rehabilitation

## 2022-03-12 ENCOUNTER — Other Ambulatory Visit (HOSPITAL_COMMUNITY): Payer: Self-pay

## 2022-03-12 VITALS — BP 134/82 | HR 62 | Ht 63.0 in | Wt 142.6 lb

## 2022-03-12 DIAGNOSIS — I6310 Cerebral infarction due to embolism of unspecified precerebral artery: Secondary | ICD-10-CM | POA: Diagnosis present

## 2022-03-12 DIAGNOSIS — E785 Hyperlipidemia, unspecified: Secondary | ICD-10-CM | POA: Insufficient documentation

## 2022-03-12 DIAGNOSIS — M7918 Myalgia, other site: Secondary | ICD-10-CM | POA: Diagnosis present

## 2022-03-12 DIAGNOSIS — E559 Vitamin D deficiency, unspecified: Secondary | ICD-10-CM | POA: Diagnosis present

## 2022-03-12 DIAGNOSIS — E663 Overweight: Secondary | ICD-10-CM | POA: Insufficient documentation

## 2022-03-12 MED ORDER — VITAMIN D (ERGOCALCIFEROL) 1.25 MG (50000 UNIT) PO CAPS: 50000.0000 [IU] | ORAL_CAPSULE | ORAL | 0 refills | Status: DC

## 2022-03-12 MED ORDER — VITAMIN D (ERGOCALCIFEROL) 1.25 MG (50000 UNIT) PO CAPS
50000.0000 [IU] | ORAL_CAPSULE | ORAL | 0 refills | Status: DC
  Filled 2022-03-12: qty 4, 28d supply, fill #0

## 2022-03-12 NOTE — Addendum Note (Signed)
Addended by: Izora Ribas on: 03/12/2022 10:30 AM   Modules accepted: Orders

## 2022-03-12 NOTE — Patient Instructions (Addendum)
Fish oil [place] of Tea: [place] with ginko biloba and black currant  Provided with list of supplements that can help with dyslipidemia: 1) Vitamin B3 500-4,'000mg'$  in divided doses daily (would recommend starting low as can cause uncomfortable facial flushing if started at too high a dose) 2) Phytosterols 2.15 grams daily 3) Fermented soy 30-50 grams daily 4) EGCG (found in green tea): 500-'1000mg'$  daily 5) Omega-3 fatty acids 3000-5,'000mg'$  daily 6) Flax seed 40 grams daily 7) Monounsaturated fats 20-40 grams daily (olives, olive oil, nuts), also reduces cardiovascular disease 8) Sesame: 40 grams daily 9) Gamma/delta tocotrienols- a family of unsaturated forms of Vitamin E- '200mg'$  with dinner 10) Pantethine '900mg'$  daily in divided doses 11) Resveratrol '250mg'$  daily 12) N Acetyl Cysteine '2000mg'$  daily in divided doses 13) Curcumin 2000-'5000mg'$  in divided doses daily 14) Pomegranate juice: 8 ounces daily, also helps to lower blood pressure 15) Pomegranate seeds one cup daily, also helps to lower blood pressure 16) Citrus Bergamot '1000mg'$  daily, also helps with glucose control and weight loss 17) Vitamin C '500mg'$  daily 18) Quercetin 500-'1000mg'$  daily 19) Glutathione 20) Probiotics 60-100 billion organisms per day 21) Fiber 22) Oats 23) Aged garlic (can eat as food or supplement of 600-'900mg'$  per day) 24) Chia seeds 25 grams per day 25) Lycopene- carotenoid found in high concentrations in tomatoes. 26) Alpha linolenic acid 27) Flavonoids and anthocyanins 28) Wogonin- flavanoid that enhances reverse cholesterol transport 29) Coenzyme Q10 30) Pantethine- derivative of Vitamin B5: '300mg'$  three times per day or '450mg'$  twice per day with or without food 31) Barley and other whole grains 32) Orange juice 33) L- carnitine 34) L- Lysine 35) L- Arginine 36) Almonds 37) Morin 38) Rutin 39) Carnosine 40) Histidine  41) Kaempferol  42) Organosulfur compounds 43) Vitamin E 44)  Oleic acid 45) RBO (ferulic acid gammaoryzanol) 46) grape seed extract 47) Red wine 48) Berberine HCL '500mg'$  daily or twice per day- more effective and with fewer adverse effects that ezetimibe monotherapy 49) red yeast rice 2400- 4800 mg/day 50) chlorella 51) Licorice

## 2022-03-12 NOTE — Progress Notes (Signed)
Subjective:    Patient ID: [name], female    DOB: 07/13/1961, 61 y.o.   MRN: [id number]  HPI  1) CVA -using walker in the halls but her hallways are too small so she often has to use the walls.  -she uses the rolling walker outside -she has been able to throw trash and start fixing breakfasts -boy friend has been fixing her other meals. -she feels ready to return to work -she works at night -she usually works 10-12 hours and feels she would be able to start at 7-8 hour shifts -she needs a document to be sent to her case manager with any restrictions -she has an eye doctor appointment on Monday -been walking a lot -eating only 2 meals per day.  -started with work at 7 hours and it has turned out ok but after 8 hours she starts to feel it  2) Red eyes -went to the eye doctor and got neomycin and this helps  3) Anxiety: -felt like she couldn't focus when she was in the grocery store -she is using magnesium glycinate   4) Dizziness -more present when she bends forward to look downward.  -associated with blurry vision  5) Overweight -lost 11 bs since April -eating eggs and grits for breakfast, now eating bananas and peanut butter toast -she eats a grilled chicken    Pain Inventory Average Pain 3 Pain Right Now 3 My pain is intermittent and soreness  LOCATION OF PAIN  neck  BOWEL Number of stools per week: 7   BLADDER Normal I   Mobility walk without assistance how many minutes can you walk? unknown ability to climb steps?  yes do you drive?  yes Do you have any goals in this area?  yes  Function employed  Neuro/Psych trouble walking depression  Prior Studies Any changes since last visit?  no  Physicians involved in your care Any changes since last visit?  no   Family History  Problem Relation Age of Onset   Diabetes Father    Hypertension Father    Heart disease Father    Hyperlipidemia Father    Stroke Father    Cancer Neg Hx     Social History   Socioeconomic History   Marital status: Single    Spouse name: Not on file   Number of children: Not on file   Years of education: 12   Highest education level: Not on file  Occupational History    Employer: [employer]  Tobacco Use   Smoking status: Former   Smokeless tobacco: Not on file  Vaping Use   Vaping Use: Never used  Substance and Sexual Activity   Alcohol use: No   Drug use: No   Sexual activity: Yes    Birth control/protection: Condom  Other Topics Concern   Not on file  Social History Narrative   Regular exercise-yes   Caffeine Use-yes   Social Determinants of Health   Financial Resource Strain: Not on file  Food Insecurity: Not on file  Transportation Needs: Not on file  Physical Activity: Not on file  Stress: Not on file  Social Connections: Not on file   No past surgical history on file. Past Medical History:  Diagnosis Date   Blood in stool    Hyperlipidemia    Vaginal fibroids    BP 134/82   Pulse 62   Ht '5\' 3"'$  (1.6 m)   Wt 142 lb 9.6 oz (64.7 kg)   LMP  07/15/2009   BMI 25.26 kg/m   Opioid Risk Score:   Fall Risk Score:  `1  Depression screen [facility] 2/9     03/12/2022    9:22 AM 09/10/2021   10:50 AM 06/17/2012   11:14 AM  Depression screen PHQ 2/9  Decreased Interest 1 3 0  Down, Depressed, Hopeless 1 2 0  PHQ - 2 Score 2 5 0  Altered sleeping  1   Tired, decreased energy  2   Change in appetite  0   Feeling bad or failure about yourself   2   Trouble concentrating  2   Moving slowly or fidgety/restless  2   Suicidal thoughts  0   PHQ-9 Score  14     Review of Systems  Constitutional:  Positive for unexpected weight change.       Loss  HENT: Negative.    Eyes: Negative.   Respiratory: Negative.    Cardiovascular: Negative.   Gastrointestinal: Negative.   Endocrine: Negative.   Genitourinary: Negative.   Musculoskeletal:  Positive for gait problem and neck pain.  Skin: Negative.    Allergic/Immunologic: Negative.   Hematological: Negative.   Psychiatric/Behavioral:  Positive for dysphoric mood.   All other systems reviewed and are negative.     Objective:   Physical Exam Gen: no distress, normal appearing, BMI 25.26 HEENT: oral mucosa pink and moist, NCAT Cardio: Reg rate Chest: normal effort, normal rate of breathing Abd: soft, non-distended Ext: no edema Psych: pleasant, normal affect Skin: intact Neuro: Normal gait, AOx3     Assessment & Plan:   1) CVA -continue HEP -would benefit from handicap placard to increase mobility in the community, provided renewal and advised that this would likely be the last renewal she needs -provided note for work with accomodation requests -continue aspirin '81mg'$  -reviewed all medications and provided necessary refills. -completed form listing her acommodations for work -discussed plan for 7-8 hours per shift right now with rest breaks as needed -we have sent form to her case manager -start Monday after neuro-ophthalmologist appointment -advised her to let me know how work is going after she starts and if she needs further acommodations  2) Blurry vision -discussed potential CN 4 dysfunction -referred to neuro-ophthalmology  3) HLD -refilled crestor -reviewed lipid panel with her -ordered new lipid panel for her Provided with list of supplements that can help with dyslipidemia: 1) Vitamin B3 500-4,'000mg'$  in divided doses daily (would recommend starting low as can cause uncomfortable facial flushing if started at too high a dose) 2) Phytosterols 2.15 grams daily 3) Fermented soy 30-50 grams daily 4) EGCG (found in green tea): 500-'1000mg'$  daily 5) Omega-3 fatty acids 3000-5,'000mg'$  daily 6) Flax seed 40 grams daily 7) Monounsaturated fats 20-40 grams daily (olives, olive oil, nuts), also reduces cardiovascular disease 8) Sesame: 40 grams daily 9) Gamma/delta tocotrienols- a family of unsaturated forms of Vitamin E-  '200mg'$  with dinner 10) Pantethine '900mg'$  daily in divided doses 11) Resveratrol '250mg'$  daily 12) N Acetyl Cysteine '2000mg'$  daily in divided doses 13) Curcumin 2000-'5000mg'$  in divided doses daily 14) Pomegranate juice: 8 ounces daily, also helps to lower blood pressure 15) Pomegranate seeds one cup daily, also helps to lower blood pressure 16) Citrus Bergamot '1000mg'$  daily, also helps with glucose control and weight loss 17) Vitamin C '500mg'$  daily 18) Quercetin 500-'1000mg'$  daily 19) Glutathione 20) Probiotics 60-100 billion organisms per day 21) Fiber 22) Oats 23) Aged garlic (can eat as food or supplement of 600-'900mg'$  per day)  24) Chia seeds 25 grams per day 25) Lycopene- carotenoid found in high concentrations in tomatoes. 26) Alpha linolenic acid 27) Flavonoids and anthocyanins 28) Wogonin- flavanoid that enhances reverse cholesterol transport 29) Coenzyme Q10 30) Pantethine- derivative of Vitamin B5: '300mg'$  three times per day or '450mg'$  twice per day with or without food 31) Barley and other whole grains 32) Orange juice 33) L- carnitine 34) L- Lysine 35) L- Arginine 36) Almonds 37) Morin 38) Rutin 39) Carnosine 40) Histidine  41) Kaempferol  42) Organosulfur compounds 43) Vitamin E 44) Oleic acid 45) RBO (ferulic acid gammaoryzanol) 46) grape seed extract 47) Red wine 48) Berberine HCL '500mg'$  daily or twice per day- more effective and with fewer adverse effects that ezetimibe monotherapy 49) red yeast rice 2400- 4800 mg/day 50) chlorella 51) Licorice   4) Vitamin D deficiency -prescribed ergocalciferol 50,000U once per week for 7 weeks  5) Overweight -provided dietary education

## 2022-03-13 LAB — LIPID PANEL
Chol/HDL Ratio: 3 ratio (ref 0.0–4.4)
Cholesterol, Total: 155 mg/dL (ref 100–199)
HDL: 52 mg/dL (ref >39)
LDL Chol Calc (NIH): 89 mg/dL (ref 0–99)
Triglycerides: 70 mg/dL (ref 0–149)
VLDL Cholesterol Cal: 14 mg/dL (ref 5–40)

## 2022-03-13 LAB — VITAMIN D 25 HYDROXY (VIT D DEFICIENCY, FRACTURES): Vit D, 25-Hydroxy: 21 ng/mL — ABNORMAL LOW (ref 30.0–100.0)

## 2022-04-04 ENCOUNTER — Other Ambulatory Visit: Payer: Self-pay | Admitting: Physical Medicine and Rehabilitation

## 2022-05-06 ENCOUNTER — Encounter: Payer: Self-pay | Admitting: Adult Health

## 2022-05-06 ENCOUNTER — Ambulatory Visit (INDEPENDENT_AMBULATORY_CARE_PROVIDER_SITE_OTHER): Payer: 59 | Admitting: Adult Health

## 2022-05-06 VITALS — BP 119/68 | HR 71 | Ht 63.0 in | Wt 146.4 lb

## 2022-05-06 DIAGNOSIS — I639 Cerebral infarction, unspecified: Secondary | ICD-10-CM | POA: Diagnosis not present

## 2022-05-06 NOTE — Progress Notes (Signed)
PATIENT: [name] DOB: [birth year]  REASON FOR VISIT: follow up HISTORY FROM: patient PRIMARY NEUROLOGIST: Dr. Leonie Man  Chief Complaint  Patient presents with   Follow-up    Rm 8, alone. Doing well, just tired working night shift.      HISTORY OF PRESENT ILLNESS: Today 05/06/22  [name] is a 61 y.o. female who has been followed in this office for cerebellar stroke. Returns today for follow-up.  She reports that overall she has not been left with any residual deficits.  Occasionally she may have some dizziness but nothing consistently.  She continues to take aspirin daily.  Remains on Crestor for her cholesterol.  Tries to maintain a healthy diet.  She works full-time night shift.  HISTORY [name] is a pleasant 61 year old African-American lady seen today for initial office visit following hospital admission for stroke in January 2023.  History is obtained from the patient and review of electronic medical records and I personally reviewed pertinent available imaging films in PACS. [name] is a 61 y.o. female with a medical history significant for hyperlipidemia, tobacco use, and vaginal fibroids who was seen on 07/28/21 at urgent care for complaints of headache, nausea and vomiting, and fever without relief with BC powder at home and during evaluation she came lightheaded and was sent to the emergency room for evaluation of dehydration. On evaluation in the ED, patient was complaining of lethargy, nausea and vomiting without response to medication.  CT head was obtained without acute abnormality. She was also noted to be unsteady with standing with a staggering gait and she was sent to [facility] for evaluation of possible central process. MRI brain was obtained on arrival to [facility] revealing acute infarcts in the right cerebellum and left occipital cortex suggesting posterior circulation emboli and neurology was consulted for further evaluation.  Patient presented outside  time window for thrombolysis did not have an LVO for thrombectomy.  CT angiogram of the head and neck showed high-grade narrowing of the nondominant right vertebral 70% at the origin and greater at the V2 segment.  There was 55% narrowing of the left vertebral origin.  2D echo showed ejection fraction of 55 to 60%.  Follow-up repeat CT scan showed increased cytotoxic edema the patient was admitted to the ICU and started on hypertonic daily which remained stable and did not need any neurosurgical intervention.  LDL cholesterol was elevated at 161 mg percent.  Hemoglobin A1c was 6.1.  Echocardiogram showed ejection fraction of 65 to 70%.  Patient was started on aspirin and has done well.  Quickly recovered.  Plans to go back to work part-time today and hopefully to prevent stone.  Patient had trouble tolerating Crestor developed some eyebrow rashes and her primary care physician switched her to Repatha injections which she is tolerating well without side effects.  She is tolerating aspirin without bleeding or bruising.  Blood pressure is good today it is 123/73.  She has no new complaints.   REVIEW OF SYSTEMS: Out of a complete 14 system review of symptoms, the patient complains only of the following symptoms, and all other reviewed systems are negative.  ALLERGIES: Allergies  Allergen Reactions   Crestor [Rosuvastatin] Itching    Hair loss of eyebrows   Amoxicillin-Pot Clavulanate Itching and Rash    HOME MEDICATIONS: Outpatient Medications Prior to Visit  Medication Sig Dispense Refill   acetaminophen (TYLENOL) 325 MG tablet Take 2 tablets (650 mg total) by mouth every 6 (  six) hours as needed for mild pain (or Fever >/= 101).     aspirin 81 MG EC tablet Take 1 tablet (81 mg total) by mouth daily. Swallow whole. 90 tablet 11   magnesium gluconate (MAGONATE) 500 MG tablet Take 0.5 tablets (250 mg total) by mouth at bedtime. 30 tablet 0   rosuvastatin (CRESTOR) 20 MG tablet Take 20 mg by mouth daily.      senna-docusate (SENOKOT-S) 8.6-50 MG tablet Take 1 tablet by mouth 2 (two) times daily.     Vitamin D, Ergocalciferol, (DRISDOL) 1.25 MG (50000 UNIT) CAPS capsule TAKE 1 CAPSULE (50,000 UNITS TOTAL) BY MOUTH EVERY 7 (SEVEN) DAYS 4 capsule 1   Evolocumab (REPATHA [place]) Inject into the skin.     Tetrahydroz-Dextran-PEG-Povid (EYE DROPS ADVANCED RELIEF) 0.05-0.1-1-1 % SOLN Apply 1 drop to eye 2 (two) times daily. 8 mL 0   No facility-administered medications prior to visit.    PAST MEDICAL HISTORY: Past Medical History:  Diagnosis Date   Blood in stool    Hyperlipidemia    Vaginal fibroids     PAST SURGICAL HISTORY: History reviewed. No pertinent surgical history.  FAMILY HISTORY: Family History  Problem Relation Age of Onset   Diabetes Father    Hypertension Father    Heart disease Father    Hyperlipidemia Father    Stroke Father    Cancer Neg Hx     SOCIAL HISTORY: Social History   Socioeconomic History   Marital status: Single    Spouse name: Not on file   Number of children: Not on file   Years of education: 12   Highest education level: Not on file  Occupational History    Employer: [employer]  Tobacco Use   Smoking status: Former   Smokeless tobacco: Not on file  Vaping Use   Vaping Use: Never used  Substance and Sexual Activity   Alcohol use: No   Drug use: No   Sexual activity: Yes    Birth control/protection: Condom  Other Topics Concern   Not on file  Social History Narrative   Regular exercise-yes   Caffeine Use-yes   Social Determinants of Health   Financial Resource Strain: Not on file  Food Insecurity: Not on file  Transportation Needs: Not on file  Physical Activity: Not on file  Stress: Not on file  Social Connections: Not on file  Intimate Partner Violence: Not on file      PHYSICAL EXAM  Vitals:   05/06/22 0953  BP: 119/68  Pulse: 71  Weight: 146 lb 6.4 oz (66.4 kg)  Height: '5\' 3"'$  (1.6 m)   Body mass index is 25.93  kg/m.  Generalized: Well developed, in no acute distress   Neurological examination  Mentation: Alert oriented to time, place, history taking. Follows all commands speech and language fluent Cranial nerve II-XII: Pupils were equal round reactive to light. Extraocular movements were full, visual field were full on confrontational test. Facial sensation and strength were normal. . Head turning and shoulder shrug  were normal and symmetric. Motor: The motor testing reveals 5 over 5 strength of all 4 extremities. Good symmetric motor tone is noted throughout.  Sensory: Sensory testing is intact to soft touch on all 4 extremities. No evidence of extinction is noted.  Coordination: Cerebellar testing reveals good finger-nose-finger and heel-to-shin bilaterally.  Gait and station: Gait is normal.  DIAGNOSTIC DATA (LABS, IMAGING, TESTING) - I reviewed patient records, labs, notes, testing and imaging myself where available.  Lab  Results  Component Value Date   WBC 6.8 08/06/2021   HGB 14.6 08/06/2021   HCT 43.4 08/06/2021   MCV 91.2 08/06/2021   PLT 228 08/06/2021      Component Value Date/Time   NA 139 08/06/2021 0632   K 4.4 08/06/2021 0632   CL 101 08/06/2021 0632   CO2 29 08/06/2021 0632   GLUCOSE 95 08/06/2021 0632   BUN 13 08/06/2021 0632   CREATININE 0.87 08/06/2021 0632   CALCIUM 9.5 08/06/2021 0632   PROT 7.7 08/06/2021 0632   ALBUMIN 3.6 08/06/2021 0632   AST 24 08/06/2021 0632   ALT 25 08/06/2021 0632   ALKPHOS 93 08/06/2021 0632   BILITOT 0.5 08/06/2021 0632   GFRNONAA >60 08/06/2021 0632   Lab Results  Component Value Date   CHOL 155 03/12/2022   HDL 52 03/12/2022   LDLCALC 89 03/12/2022   LDLDIRECT 196.4 06/17/2012   TRIG 70 03/12/2022   CHOLHDL 3.0 03/12/2022   Lab Results  Component Value Date   HGBA1C 6.1 (H) 07/29/2021   No results found for: "VITAMINB12" Lab Results  Component Value Date   TSH 0.524 07/30/2021      ASSESSMENT AND PLAN 61 y.o.  year old female  has a past medical history of Blood in stool, Hyperlipidemia, and Vaginal fibroids. here with  Cerebellar infarct  Continue aspirin 81 mg daily  for secondary stroke prevention.  Discussed secondary stroke prevention measures and importance of close PCP follow up for aggressive stroke risk factor management. I have gone over the pathophysiology of stroke, warning signs and symptoms, risk factors and their management in some detail with instructions to go to the closest emergency room for symptoms of concern. HTN: BP goal <130/90.   HLD: LDL goal <70. Recent LDL 89. On Crestor DMII: A1c goal<7.0. Recent A1c .6.1 Encouraged patient to monitor diet and encouraged exercise FU with our office PRN     Ward Givens, MSN, NP-C 05/06/2022, 10:17 AM [facility] [address] [place], [place] [address] [phone]

## 2022-06-07 ENCOUNTER — Other Ambulatory Visit: Payer: Self-pay | Admitting: Physical Medicine and Rehabilitation

## 2022-06-11 ENCOUNTER — Encounter: Payer: 59 | Admitting: Physical Medicine and Rehabilitation

## 2022-06-28 ENCOUNTER — Other Ambulatory Visit (HOSPITAL_BASED_OUTPATIENT_CLINIC_OR_DEPARTMENT_OTHER): Payer: Self-pay

## 2022-08-12 ENCOUNTER — Encounter
Payer: No Typology Code available for payment source | Attending: Physical Medicine and Rehabilitation | Admitting: Physical Medicine and Rehabilitation

## 2022-08-12 ENCOUNTER — Other Ambulatory Visit: Payer: Self-pay | Admitting: Physical Medicine and Rehabilitation

## 2022-08-12 DIAGNOSIS — E663 Overweight: Secondary | ICD-10-CM | POA: Diagnosis present

## 2022-08-12 DIAGNOSIS — E785 Hyperlipidemia, unspecified: Secondary | ICD-10-CM | POA: Insufficient documentation

## 2022-08-12 DIAGNOSIS — F411 Generalized anxiety disorder: Secondary | ICD-10-CM | POA: Diagnosis present

## 2022-08-12 DIAGNOSIS — M7918 Myalgia, other site: Secondary | ICD-10-CM | POA: Diagnosis present

## 2022-08-12 DIAGNOSIS — E559 Vitamin D deficiency, unspecified: Secondary | ICD-10-CM | POA: Insufficient documentation

## 2022-08-12 NOTE — Progress Notes (Signed)
Subjective:    Patient ID: [name], female    DOB: 14-Mar-1961, 62 y.o.   MRN: [id number]  HPI  1) CVA -using walker in the halls but her hallways are too small so she often has to use the walls.  -she uses the rolling walker outside -she has been able to throw trash and start fixing breakfasts -boy friend has been fixing her other meals. -she feels ready to return to work -she works at night -she usually works 10-12 hours and feels she would be able to start at 7-8 hour shifts -she needs a document to be sent to her case manager with any restrictions -she has an eye doctor appointment on Monday -been walking a lot -eating only 2 meals per day.  -started with work at 7 hours and it has turned out ok but after 8 hours she starts to feel it  2) Red eyes -went to the eye doctor and got neomycin and this helps  3) Anxiety: -felt like she couldn't focus when she was in the grocery store -she is using magnesium glycinate, she feels that this relaxes her  4) Dizziness -more present when she bends forward to look downward.  -associated with blurry vision -improved  5) Overweight -lost 11 bs since April -eating eggs and grits for breakfast, now eating bananas and peanut butter toast -she eats a grilled chicken  6) Myofascial pain -when she overextends herself at work -she has been trying to work past 8 hours but finds this is difficult for her.     Pain Inventory Average Pain 3 Pain Right Now 3 My pain is intermittent and soreness  LOCATION OF PAIN  neck  BOWEL Number of stools per week: 7   BLADDER Normal I   Mobility walk without assistance how many minutes can you walk? unknown ability to climb steps?  yes do you drive?  yes Do you have any goals in this area?  yes  Function employed  Neuro/Psych trouble walking depression  Prior Studies Any changes since last visit?  no  Physicians involved in your care Any changes since last visit?   no   Family History  Problem Relation Age of Onset   Diabetes Father    Hypertension Father    Heart disease Father    Hyperlipidemia Father    Stroke Father    Cancer Neg Hx    Social History   Socioeconomic History   Marital status: Single    Spouse name: Not on file   Number of children: Not on file   Years of education: 12   Highest education level: Not on file  Occupational History    Employer: [employer]  Tobacco Use   Smoking status: Former   Smokeless tobacco: Not on file  Vaping Use   Vaping Use: Never used  Substance and Sexual Activity   Alcohol use: No   Drug use: No   Sexual activity: Yes    Birth control/protection: Condom  Other Topics Concern   Not on file  Social History Narrative   Regular exercise-yes   Caffeine Use-yes   Social Determinants of Health   Financial Resource Strain: Not on file  Food Insecurity: Not on file  Transportation Needs: Not on file  Physical Activity: Not on file  Stress: Not on file  Social Connections: Not on file   No past surgical history on file. Past Medical History:  Diagnosis Date   Blood in stool    Hyperlipidemia  Vaginal fibroids    LMP 07/15/2009   Opioid Risk Score:   Fall Risk Score:  `1  Depression screen [facility] 2/9     03/12/2022    9:22 AM 09/10/2021   10:50 AM 06/17/2012   11:14 AM  Depression screen PHQ 2/9  Decreased Interest 1 3 0  Down, Depressed, Hopeless 1 2 0  PHQ - 2 Score 2 5 0  Altered sleeping  1   Tired, decreased energy  2   Change in appetite  0   Feeling bad or failure about yourself   2   Trouble concentrating  2   Moving slowly or fidgety/restless  2   Suicidal thoughts  0   PHQ-9 Score  14     Review of Systems  Constitutional:  Positive for unexpected weight change.       Loss  HENT: Negative.    Eyes: Negative.   Respiratory: Negative.    Cardiovascular: Negative.   Gastrointestinal: Negative.   Endocrine: Negative.   Genitourinary: Negative.    Musculoskeletal:  Positive for gait problem and neck pain.  Skin: Negative.   Allergic/Immunologic: Negative.   Hematological: Negative.   Psychiatric/Behavioral:  Positive for dysphoric mood.   All other systems reviewed and are negative.     Objective:   Physical Exam Gen: no distress, normal appearing, BMI 25.26 HEENT: oral mucosa pink and moist, NCAT Cardio: Reg rate Chest: normal effort, normal rate of breathing Abd: soft, non-distended Ext: no edema Psych: pleasant, normal affect Skin: intact Neuro: Normal gait, AOx3     Assessment & Plan:   1) CVA -continue HEP -provided note for work with work restrictions requested -would benefit from handicap placard to increase mobility in the community, provided renewal and advised that this would likely be the last renewal she needs -continue aspirin '81mg'$  -reviewed all medications and provided necessary refills. -completed form listing her acommodations for work -discussed plan for 7-8 hours per shift right now with rest breaks as needed -we have sent form to her case manager -start Monday after neuro-ophthalmologist appointment -advised her to let me know how work is going after she starts and if she needs further acommodations  2) Blurry vision -discussed potential CN 4 dysfunction -referred to neuro-ophthalmology  3) HLD -refilled crestor -repeat lipid panel today Provided with list of supplements that can help with dyslipidemia: 1) Vitamin B3 500-4,'000mg'$  in divided doses daily (would recommend starting low as can cause uncomfortable facial flushing if started at too high a dose) 2) Phytosterols 2.15 grams daily 3) Fermented soy 30-50 grams daily 4) EGCG (found in green tea): 500-'1000mg'$  daily 5) Omega-3 fatty acids 3000-5,'000mg'$  daily 6) Flax seed 40 grams daily 7) Monounsaturated fats 20-40 grams daily (olives, olive oil, nuts), also reduces cardiovascular disease 8) Sesame: 40 grams daily 9) Gamma/delta  tocotrienols- a family of unsaturated forms of Vitamin E- '200mg'$  with dinner 10) Pantethine '900mg'$  daily in divided doses 11) Resveratrol '250mg'$  daily 12) N Acetyl Cysteine '2000mg'$  daily in divided doses 13) Curcumin 2000-'5000mg'$  in divided doses daily 14) Pomegranate juice: 8 ounces daily, also helps to lower blood pressure 15) Pomegranate seeds one cup daily, also helps to lower blood pressure 16) Citrus Bergamot '1000mg'$  daily, also helps with glucose control and weight loss 17) Vitamin C '500mg'$  daily 18) Quercetin 500-'1000mg'$  daily 19) Glutathione 20) Probiotics 60-100 billion organisms per day 21) Fiber 22) Oats 23) Aged garlic (can eat as food or supplement of 600-'900mg'$  per day) 24) Chia seeds 25 grams  per day 25) Lycopene- carotenoid found in high concentrations in tomatoes. 26) Alpha linolenic acid 27) Flavonoids and anthocyanins 28) Wogonin- flavanoid that enhances reverse cholesterol transport 29) Coenzyme Q10 30) Pantethine- derivative of Vitamin B5: '300mg'$  three times per day or '450mg'$  twice per day with or without food 31) Barley and other whole grains 32) Orange juice 33) L- carnitine 34) L- Lysine 35) L- Arginine 36) Almonds 37) Morin 38) Rutin 39) Carnosine 40) Histidine  41) Kaempferol  42) Organosulfur compounds 43) Vitamin E 44) Oleic acid 45) RBO (ferulic acid gammaoryzanol) 46) grape seed extract 47) Red wine 48) Berberine HCL '500mg'$  daily or twice per day- more effective and with fewer adverse effects that ezetimibe monotherapy 49) red yeast rice 2400- 4800 mg/day 50) chlorella 51) Licorice   4) Vitamin D deficiency -prescribed ergocalciferol 50,000U once per week for 7 weeks -discussed benefits of supplementing/sunlight exposure  5) Overweight -provided dietary education  6) Anxiety: -continue magnesium

## 2022-08-13 LAB — LIPID PANEL
Chol/HDL Ratio: 3 ratio (ref 0.0–4.4)
Cholesterol, Total: 172 mg/dL (ref 100–199)
HDL: 58 mg/dL (ref >39)
LDL Chol Calc (NIH): 99 mg/dL (ref 0–99)
Triglycerides: 81 mg/dL (ref 0–149)
VLDL Cholesterol Cal: 15 mg/dL (ref 5–40)

## 2022-08-15 ENCOUNTER — Encounter
Payer: No Typology Code available for payment source | Attending: Physical Medicine and Rehabilitation | Admitting: Physical Medicine and Rehabilitation

## 2022-08-15 DIAGNOSIS — E785 Hyperlipidemia, unspecified: Secondary | ICD-10-CM | POA: Insufficient documentation

## 2022-08-15 NOTE — Progress Notes (Signed)
Left VM to discuss her lipid panel results

## 2022-08-27 ENCOUNTER — Telehealth: Payer: Self-pay | Admitting: Physical Medicine and Rehabilitation

## 2022-08-27 ENCOUNTER — Other Ambulatory Visit: Payer: Self-pay | Admitting: Physical Medicine and Rehabilitation

## 2022-08-27 MED ORDER — ROSUVASTATIN CALCIUM 10 MG PO TABS: 10.0000 mg | ORAL_TABLET | Freq: Every day | ORAL | 3 refills | Status: DC

## 2022-08-27 NOTE — Telephone Encounter (Signed)
Patient asking for 10 mg of rosuvastatin to be sent in as you had discussed with her.

## 2022-09-02 ENCOUNTER — Telehealth: Payer: Self-pay | Admitting: Physical Medicine and Rehabilitation

## 2022-09-02 ENCOUNTER — Encounter: Payer: Self-pay | Admitting: Physical Medicine and Rehabilitation

## 2022-09-02 NOTE — Telephone Encounter (Signed)
You previously wrote a letter for 8 hour shifts. However, there is not an ending date. The employer is requesting another letter with an ending date.

## 2023-02-10 ENCOUNTER — Encounter
Payer: No Typology Code available for payment source | Attending: Physical Medicine and Rehabilitation | Admitting: Physical Medicine and Rehabilitation

## 2023-02-21 ENCOUNTER — Encounter: Payer: Self-pay | Admitting: Physical Medicine and Rehabilitation

## 2023-02-21 ENCOUNTER — Encounter
Payer: No Typology Code available for payment source | Attending: Physical Medicine and Rehabilitation | Admitting: Physical Medicine and Rehabilitation

## 2023-02-21 VITALS — BP 138/79 | HR 60 | Ht 63.0 in | Wt 158.2 lb

## 2023-02-21 DIAGNOSIS — Z6828 Body mass index (BMI) 28.0-28.9, adult: Secondary | ICD-10-CM | POA: Insufficient documentation

## 2023-02-21 DIAGNOSIS — F419 Anxiety disorder, unspecified: Secondary | ICD-10-CM

## 2023-02-21 DIAGNOSIS — I10 Essential (primary) hypertension: Secondary | ICD-10-CM | POA: Insufficient documentation

## 2023-02-21 DIAGNOSIS — H538 Other visual disturbances: Secondary | ICD-10-CM | POA: Diagnosis not present

## 2023-02-21 DIAGNOSIS — W19XXXS Unspecified fall, sequela: Secondary | ICD-10-CM | POA: Diagnosis not present

## 2023-02-21 DIAGNOSIS — E559 Vitamin D deficiency, unspecified: Secondary | ICD-10-CM | POA: Diagnosis not present

## 2023-02-21 DIAGNOSIS — M542 Cervicalgia: Secondary | ICD-10-CM | POA: Insufficient documentation

## 2023-02-21 DIAGNOSIS — F411 Generalized anxiety disorder: Secondary | ICD-10-CM | POA: Insufficient documentation

## 2023-02-21 DIAGNOSIS — E785 Hyperlipidemia, unspecified: Secondary | ICD-10-CM | POA: Insufficient documentation

## 2023-02-21 DIAGNOSIS — I2510 Atherosclerotic heart disease of native coronary artery without angina pectoris: Secondary | ICD-10-CM | POA: Insufficient documentation

## 2023-02-21 DIAGNOSIS — M7918 Myalgia, other site: Secondary | ICD-10-CM | POA: Insufficient documentation

## 2023-02-21 DIAGNOSIS — Z8673 Personal history of transient ischemic attack (TIA), and cerebral infarction without residual deficits: Secondary | ICD-10-CM | POA: Diagnosis not present

## 2023-02-21 DIAGNOSIS — X58XXXS Exposure to other specified factors, sequela: Secondary | ICD-10-CM | POA: Diagnosis not present

## 2023-02-21 DIAGNOSIS — E663 Overweight: Secondary | ICD-10-CM | POA: Insufficient documentation

## 2023-02-21 DIAGNOSIS — R262 Difficulty in walking, not elsewhere classified: Secondary | ICD-10-CM | POA: Insufficient documentation

## 2023-02-21 DIAGNOSIS — Z7982 Long term (current) use of aspirin: Secondary | ICD-10-CM | POA: Insufficient documentation

## 2023-02-21 DIAGNOSIS — Z79899 Other long term (current) drug therapy: Secondary | ICD-10-CM | POA: Insufficient documentation

## 2023-02-21 DIAGNOSIS — R296 Repeated falls: Secondary | ICD-10-CM

## 2023-02-21 NOTE — Progress Notes (Signed)
Subjective:    Patient ID: [name], female    DOB: 09-Nov-1960, 62 y.o.   MRN: [id number]  HPI  1) CVA -using walker in the halls but her hallways are too small so she often has to use the walls.  -she uses the rolling walker outside -she has been able to throw trash and start fixing breakfasts -boy friend has been fixing her other meals. -she feels ready to return to work -she works at night -she usually works 10-12 hours and feels she would be able to start at 7-8 hour shifts -she needs a document to be sent to her case manager with any restrictions -she has an eye doctor appointment on Monday -been walking a lot -eating only 2 meals per day.  -started with work at 7 hours and it has turned out ok but after 8 hours she starts to feel it  2) Red eyes -went to the eye doctor and got neomycin and this helps  3) Anxiety: -felt like she couldn't focus when she was in the grocery store -she is using magnesium glycinate, she feels that this relaxes her  4) Dizziness -more present when she bends forward to look downward.  -associated with blurry vision -improved  5) Overweight She has been eating a lot of fruit, M&Ms, cornbread -eating eggs and grits for breakfast, now eating bananas and peanut butter toast -she eats a grilled chicken  6) Myofascial pain -when she overextends herself at work -she has been trying to work past 8 hours but finds this is difficult for her.   7) Constipation: -she asks if she needs to take the senna    Pain Inventory Average Pain 3 Pain Right Now 3 My pain is intermittent and soreness  LOCATION OF PAIN  neck  BOWEL Number of stools per week: 7   BLADDER Normal I   Mobility walk without assistance how many minutes can you walk? unknown ability to climb steps?  yes do you drive?  yes Do you have any goals in this area?  yes  Function employed  Neuro/Psych trouble walking depression  Prior Studies Any changes  since last visit?  no  Physicians involved in your care Any changes since last visit?  no   Family History  Problem Relation Age of Onset   Diabetes Father    Hypertension Father    Heart disease Father    Hyperlipidemia Father    Stroke Father    Cancer Neg Hx    Social History   Socioeconomic History   Marital status: Single    Spouse name: Not on file   Number of children: Not on file   Years of education: 12   Highest education level: Not on file  Occupational History    Employer: [employer]  Tobacco Use   Smoking status: Former   Smokeless tobacco: Not on file  Vaping Use   Vaping status: Never Used  Substance and Sexual Activity   Alcohol use: No   Drug use: No   Sexual activity: Yes    Birth control/protection: Condom  Other Topics Concern   Not on file  Social History Narrative   Regular exercise-yes   Caffeine Use-yes   Social Determinants of Health   Financial Resource Strain: Not on file  Food Insecurity: Not on file  Transportation Needs: Not on file  Physical Activity: Not on file  Stress: Not on file  Social Connections: Not on file   No past surgical history  on file. Past Medical History:  Diagnosis Date   Blood in stool    Hyperlipidemia    Vaginal fibroids    BP 138/79   Pulse 60   Ht 5\' 3"  (1.6 m)   Wt 158 lb 3.2 oz (71.8 kg)   LMP 07/15/2009   SpO2 98%   BMI 28.02 kg/m   Opioid Risk Score:   Fall Risk Score:  `1  Depression screen [facility] 2/9     02/21/2023   10:40 AM 03/12/2022    9:22 AM 09/10/2021   10:50 AM 06/17/2012   11:14 AM  Depression screen PHQ 2/9  Decreased Interest 0 1 3 0  Down, Depressed, Hopeless 0 1 2 0  PHQ - 2 Score 0 2 5 0  Altered sleeping   1   Tired, decreased energy   2   Change in appetite   0   Feeling bad or failure about yourself    2   Trouble concentrating   2   Moving slowly or fidgety/restless   2   Suicidal thoughts   0   PHQ-9 Score   14     Review of Systems  Constitutional:   Positive for unexpected weight change.       Loss  HENT: Negative.    Eyes: Negative.   Respiratory: Negative.    Cardiovascular: Negative.   Gastrointestinal: Negative.   Endocrine: Negative.   Genitourinary: Negative.   Musculoskeletal:  Positive for gait problem and neck pain.  Skin: Negative.   Allergic/Immunologic: Negative.   Hematological: Negative.   Psychiatric/Behavioral:  Positive for dysphoric mood.   All other systems reviewed and are negative.      Objective:   Physical Exam Gen: no distress, normal appearing, BMI 28.02 HEENT: oral mucosa pink and moist, NCAT Cardio: Reg rate Chest: normal effort, normal rate of breathing Abd: soft, non-distended Ext: no edema Psych: pleasant, normal affect Skin: intact Neuro: Normal gait, AOx3     Assessment & Plan:   1) CVA -continue HEP -provided note for work with work restrictions requested -would benefit from handicap placard to increase mobility in the community, provided renewal and advised that this would likely be the last renewal she needs -continue aspirin 81mg  -reviewed all medications and provided necessary refills. -completed form listing her acommodations for work -discussed plan for 7-8 hours per shift right now with rest breaks as needed -we have sent form to her case manager -start Monday after neuro-ophthalmologist appointment -advised her to let me know how work is going after she starts and if she needs further acommodations  2) Blurry vision -discussed potential CN 4 dysfunction -referred to neuro-ophthalmology  3) HLD -refilled crestor -repeat lipid panel today Provided with list of supplements that can help with dyslipidemia: 1) Vitamin B3 500-4,000mg  in divided doses daily (would recommend starting low as can cause uncomfortable facial flushing if started at too high a dose) 2) Phytosterols 2.15 grams daily 3) Fermented soy 30-50 grams daily 4) EGCG (found in green tea): 500-1000mg   daily 5) Omega-3 fatty acids 3000-5,000mg  daily 6) Flax seed 40 grams daily 7) Monounsaturated fats 20-40 grams daily (olives, olive oil, nuts), also reduces cardiovascular disease 8) Sesame: 40 grams daily 9) Gamma/delta tocotrienols- a family of unsaturated forms of Vitamin E- 200mg  with dinner 10) Pantethine 900mg  daily in divided doses 11) Resveratrol 250mg  daily 12) N Acetyl Cysteine 2000mg  daily in divided doses 13) Curcumin 2000-5000mg  in divided doses daily 14) Pomegranate juice: 8 ounces daily, also  helps to lower blood pressure 15) Pomegranate seeds one cup daily, also helps to lower blood pressure 16) Citrus Bergamot 1000mg  daily, also helps with glucose control and weight loss 17) Vitamin C 500mg  daily 18) Quercetin 500-1000mg  daily 19) Glutathione 20) Probiotics 60-100 billion organisms per day 21) Fiber 22) Oats 23) Aged garlic (can eat as food or supplement of 600-900mg  per day) 24) Chia seeds 25 grams per day 25) Lycopene- carotenoid found in high concentrations in tomatoes. 26) Alpha linolenic acid 27) Flavonoids and anthocyanins 28) Wogonin- flavanoid that enhances reverse cholesterol transport 29) Coenzyme Q10 30) Pantethine- derivative of Vitamin B5: 300mg  three times per day or 450mg  twice per day with or without food 31) Barley and other whole grains 32) Orange juice 33) L- carnitine 34) L- Lysine 35) L- Arginine 36) Almonds 37) Morin 38) Rutin 39) Carnosine 40) Histidine  41) Kaempferol  42) Organosulfur compounds 43) Vitamin E 44) Oleic acid 45) RBO (ferulic acid gammaoryzanol) 46) grape seed extract 47) Red wine 48) Berberine HCL 500mg  daily or twice per day- more effective and with fewer adverse effects that ezetimibe monotherapy 49) red yeast rice 2400- 4800 mg/day 50) chlorella 51) Licorice   4) Vitamin D deficiency -prescribed ergocalciferol 50,000U once per week for 7 weeks -discussed benefits of supplementing/sunlight exposure  5)  Overweight -provided dietary education -encouraged walking -recommended checking weight every day -discussed benefits of resistance training  6) Anxiety: -continue magnesium  7) HTN: -continue magnesium  8) s/p falls: -discussed that she fell twice  9) Dizziness: -discussed that thus has resolved

## 2023-02-21 NOTE — Patient Instructions (Signed)
[place]: [employer]

## 2023-02-25 IMAGING — MG MM DIGITAL SCREENING BILAT W/ TOMO AND CAD
6 of 10 series · 6 of 30 positions shown · non-contrast
Comparison: Previous exam(s).

CLINICAL DATA: Screening.

EXAM:
DIGITAL SCREENING BILATERAL MAMMOGRAM WITH TOMOSYNTHESIS AND CAD
TECHNIQUE: Bilateral screening digital craniocaudal and mediolateral oblique
mammograms were obtained. Bilateral screening digital breast
tomosynthesis was performed. The images were evaluated with
computer-aided detection.

[L MLO synth-2D]
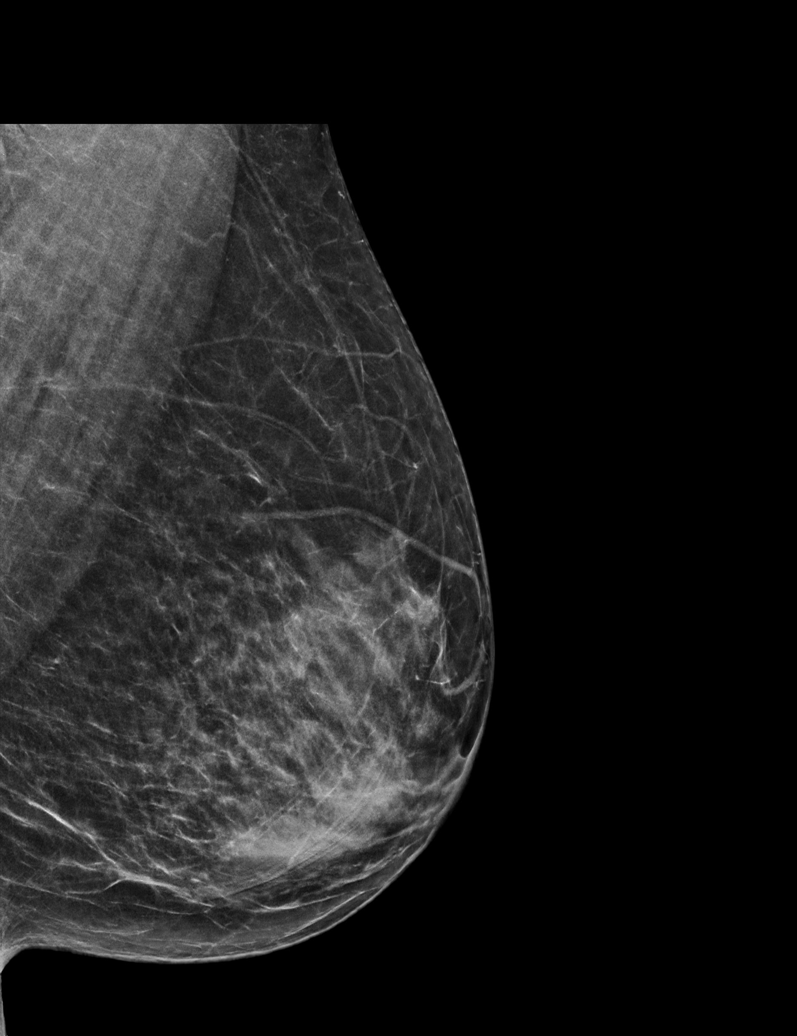

[R MLO synth-2D (1 of 2)]
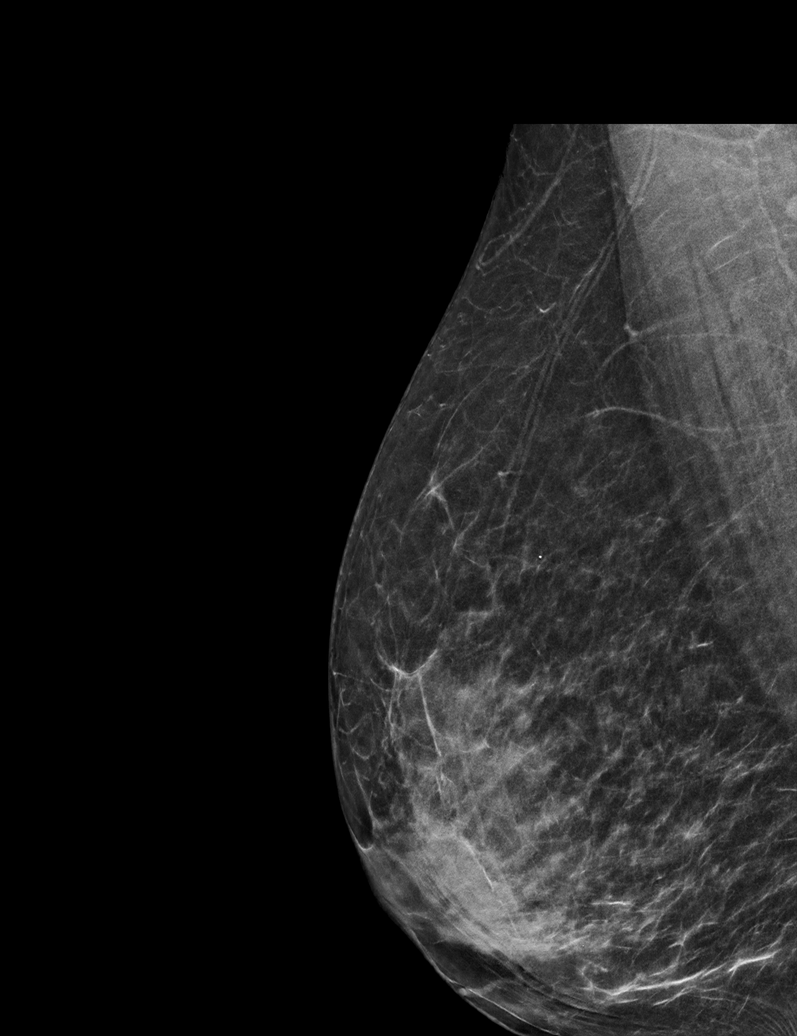

[R MLO synth-2D (2 of 2)]
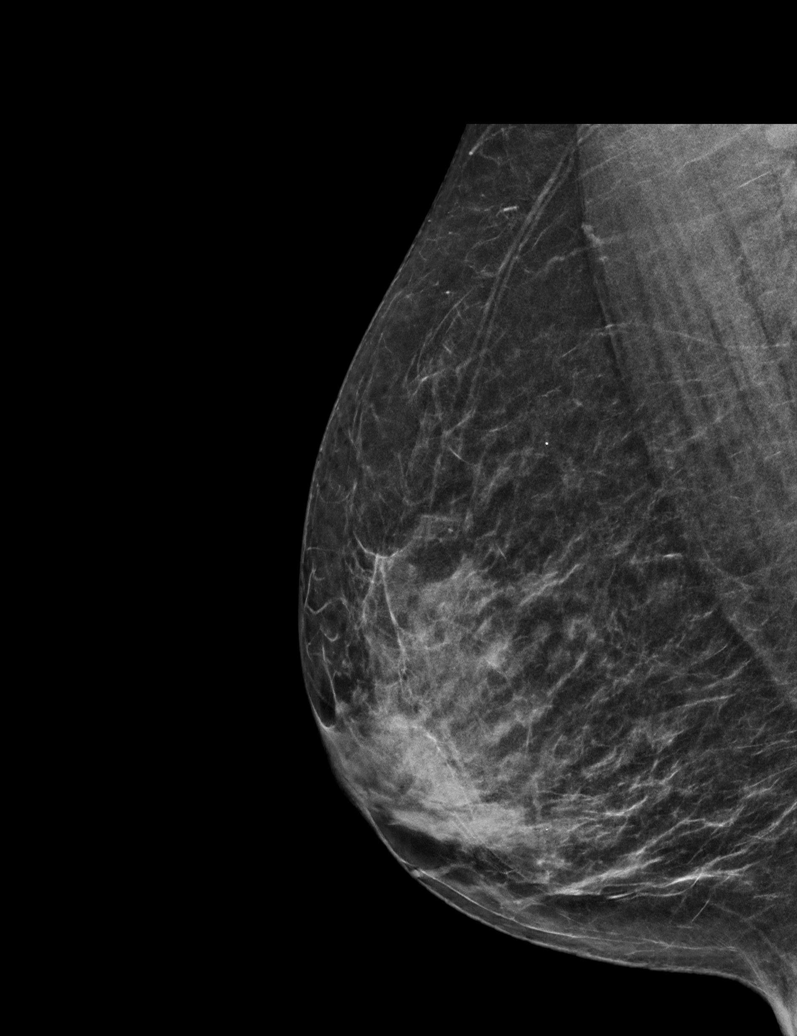

[L CC synth-2D]
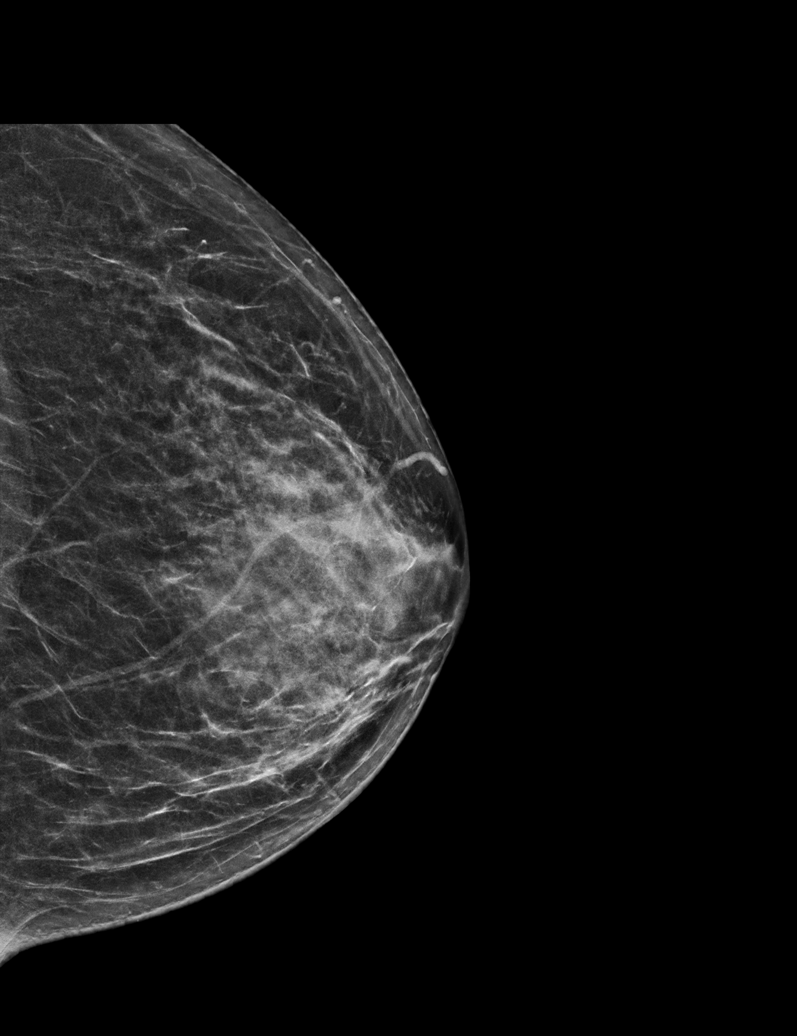

[R CC synth-2D]
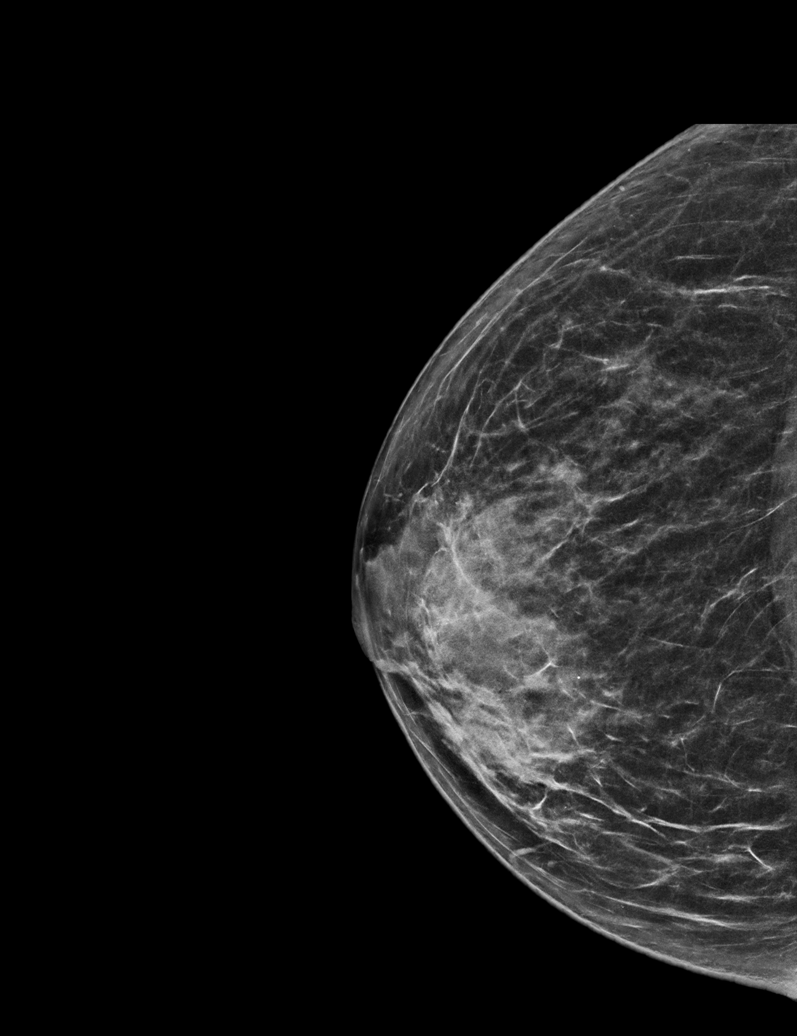

[R MLO tomo · tomo slice 29/56.0]
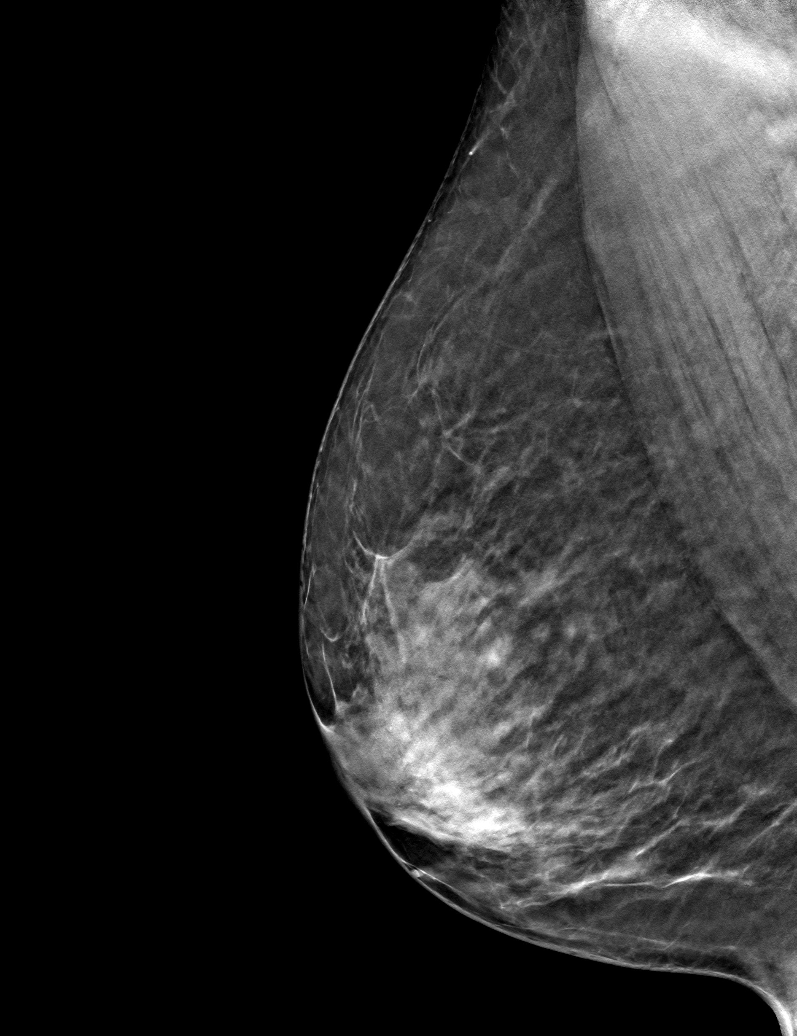

[6 of 30 positions shown; findings below may reference images not displayed]

ACR Breast Density Category c: The breast tissue is heterogeneously
dense, which may obscure small masses.
FINDINGS: There are no findings suspicious for malignancy.
IMPRESSION: No mammographic evidence of malignancy. A result letter of this
screening mammogram will be mailed directly to the patient.

RECOMMENDATION:
Screening mammogram in one year. (Code:Q3-W-BC3)

BI-RADS CATEGORY  1: Negative.

## 2023-08-22 ENCOUNTER — Encounter
Payer: BC Managed Care – PPO | Attending: Physical Medicine and Rehabilitation | Admitting: Physical Medicine and Rehabilitation

## 2023-08-22 VITALS — BP 125/69 | HR 69 | Ht 63.0 in | Wt 164.0 lb

## 2023-08-22 DIAGNOSIS — I639 Cerebral infarction, unspecified: Secondary | ICD-10-CM | POA: Diagnosis not present

## 2023-08-22 DIAGNOSIS — F411 Generalized anxiety disorder: Secondary | ICD-10-CM | POA: Insufficient documentation

## 2023-08-22 DIAGNOSIS — G4700 Insomnia, unspecified: Secondary | ICD-10-CM | POA: Diagnosis not present

## 2023-08-22 NOTE — Progress Notes (Signed)
 Subjective:    Patient ID: [name], female    DOB: [birth year], 63 y.o.   MRN: [id number]  HPI  1) CVA -walking without assistive device -went back to full time -she has been able to throw trash and start fixing breakfasts -boy friend has been very supportive -she works at night -she usually works 10-12 hours and feels she would be able to start at 7-8 hour shifts -she needs a document to be sent to her case manager with any restrictions -she has an eye doctor appointment on Monday -been walking a lot -eating only 2 meals per day.  -started with work at 7 hours and it has turned out ok but after 8 hours she starts to feel it  2) Red eyes -went to the eye doctor and got neomycin and this helps  3) Anxiety: -felt like she couldn't focus when she was in the grocery store -she is using magnesium  glycinate, she feels that this relaxes her, this has been really helping her -this is still present  4) Dizziness -more present when she bends forward to look downward.  -associated with blurry vision -improved  5) Overweight She has been eating a lot of fruit, M&Ms, cornbread -eating eggs and grits for breakfast, now eating bananas and peanut butter toast -she eats a grilled chicken  6) Myofascial pain -when she overextends herself at work -she has been trying to work past 8 hours but finds this is difficult for her.   7) Constipation: -she asks if she needs to take the senna    Pain Inventory Average Pain 5 Pain Right Now 2 My pain is intermittent and soreness  LOCATION OF PAIN  neck Sleep good Laying down improves pain Turning head and standing on feet all day worsens pain    Family History  Problem Relation Age of Onset   Diabetes Father    Hypertension Father    Heart disease Father    Hyperlipidemia Father    Stroke Father    Cancer Neg Hx    Social History   Socioeconomic History   Marital status: Single    Spouse name: Not on file   Number of  children: Not on file   Years of education: 12   Highest education level: Not on file  Occupational History    Employer: [employer]  Tobacco Use   Smoking status: Former   Smokeless tobacco: Not on file  Vaping Use   Vaping status: Never Used  Substance and Sexual Activity   Alcohol use: No   Drug use: No   Sexual activity: Yes    Birth control/protection: Condom  Other Topics Concern   Not on file  Social History Narrative   Regular exercise-yes   Caffeine Use-yes   Social Drivers of Health   Financial Resource Strain: Not on file  Food Insecurity: Not on file  Transportation Needs: Not on file  Physical Activity: Not on file  Stress: Not on file  Social Connections: Not on file   No past surgical history on file. Past Medical History:  Diagnosis Date   Blood in stool    Hyperlipidemia    Vaginal fibroids    BP 125/69   Pulse 69   Ht 5' 3 (1.6 m)   Wt 164 lb (74.4 kg)   LMP 07/15/2009   BMI 29.05 kg/m   Opioid Risk Score:   Fall Risk Score:  `1  Depression screen [facility] 2/9     02/21/2023  10:40 AM 03/12/2022    9:22 AM 09/10/2021   10:50 AM 06/17/2012   11:14 AM  Depression screen PHQ 2/9  Decreased Interest 0 1 3 0  Down, Depressed, Hopeless 0 1 2 0  PHQ - 2 Score 0 2 5 0  Altered sleeping   1   Tired, decreased energy   2   Change in appetite   0   Feeling bad or failure about yourself    2   Trouble concentrating   2   Moving slowly or fidgety/restless   2   Suicidal thoughts   0   PHQ-9 Score   14     Review of Systems  Constitutional:  Positive for unexpected weight change.       Loss  HENT: Negative.    Eyes: Negative.   Respiratory: Negative.    Cardiovascular: Negative.   Gastrointestinal: Negative.   Endocrine: Negative.   Genitourinary: Negative.   Musculoskeletal:  Positive for gait problem and neck pain.  Skin: Negative.   Allergic/Immunologic: Negative.   Hematological: Negative.   Psychiatric/Behavioral:  Positive for  dysphoric mood.   All other systems reviewed and are negative.      Objective:   Physical Exam Gen: no distress, normal appearing, BMI 28.02 HEENT: oral mucosa pink and moist, NCAT Cardio: Reg rate Chest: normal effort, normal rate of breathing Abd: soft, non-distended Ext: no edema Psych: pleasant, normal affect Skin: intact Neuro: Normal gait, AOx3     Assessment & Plan:   1) CVA -continue HEP -provided note for work with work restrictions requested -would benefit from handicap placard to increase mobility in the community, provided renewal and advised that this would likely be the last renewal she needs -continue aspirin  81mg  -reviewed all medications and provided necessary refills. -completed form listing her acommodations for work -discussed plan for 7-8 hours per shift right now with rest breaks as needed -we have sent form to her case manager -start Monday after neuro-ophthalmologist appointment -advised her to let me know how work is going after she starts and if she needs further acommodations  2) Blurry vision -discussed potential CN 4 dysfunction -referred to neuro-ophthalmology  3) HLD -refilled crestor  -repeat lipid panel today Provided with list of supplements that can help with dyslipidemia: 1) Vitamin B3 500-4,000mg  in divided doses daily (would recommend starting low as can cause uncomfortable facial flushing if started at too high a dose) 2) Phytosterols 2.15 grams daily 3) Fermented soy 30-50 grams daily 4) EGCG (found in green tea): 500-1000mg  daily 5) Omega-3 fatty acids 3000-5,000mg  daily 6) Flax seed 40 grams daily 7) Monounsaturated fats 20-40 grams daily (olives, olive oil, nuts), also reduces cardiovascular disease 8) Sesame: 40 grams daily 9) Gamma/delta tocotrienols- a family of unsaturated forms of Vitamin E- 200mg  with dinner 10) Pantethine 900mg  daily in divided doses 11) Resveratrol 250mg  daily 12) N Acetyl Cysteine 2000mg  daily in  divided doses 13) Curcumin 2000-5000mg  in divided doses daily 14) Pomegranate juice: 8 ounces daily, also helps to lower blood pressure 15) Pomegranate seeds one cup daily, also helps to lower blood pressure 16) Citrus Bergamot 1000mg  daily, also helps with glucose control and weight loss 17) Vitamin C 500mg  daily 18) Quercetin 500-1000mg  daily 19) Glutathione 20) Probiotics 60-100 billion organisms per day 21) Fiber 22) Oats 23) Aged garlic (can eat as food or supplement of 600-900mg  per day) 24) Chia seeds 25 grams per day 25) Lycopene- carotenoid found in high concentrations in tomatoes. 26) Alpha linolenic  acid 27) Flavonoids and anthocyanins 28) Wogonin- flavanoid that enhances reverse cholesterol transport 29) Coenzyme Q10 30) Pantethine- derivative of Vitamin B5: 300mg  three times per day or 450mg  twice per day with or without food 31) Barley and other whole grains 32) Orange juice 33) L- carnitine 34) L- Lysine 35) L- Arginine 36) Almonds 37) Morin 38) Rutin 39) Carnosine 40) Histidine  41) Kaempferol  42) Organosulfur compounds 43) Vitamin E 44) Oleic acid 45) RBO (ferulic acid gammaoryzanol) 46) grape seed extract 47) Red wine 48) Berberine HCL 500mg  daily or twice per day- more effective and with fewer adverse effects that ezetimibe monotherapy 49) red yeast rice 2400- 4800 mg/day 50) chlorella 51) Licorice   4) Vitamin D  deficiency -prescribed ergocalciferol  50,000U once per week for 7 weeks -discussed benefits of supplementing/sunlight exposure  5) Overweight -provided dietary education -encouraged walking -recommended checking weight every day -discussed benefits of resistance training  6) Anxiety: -continue magnesium  glycinate, discussed that this has really been helping her  7) HTN: -continue magnesium   8) s/p falls: -discussed that she has had no falls  9) Dizziness: Discussed that once in a while she feels dizzy  10)  Insomnia: -discussed that the magnesium  glycinate helps her sleep

## 2023-09-27 ENCOUNTER — Other Ambulatory Visit: Payer: Self-pay | Admitting: Physical Medicine and Rehabilitation

## 2023-09-30 ENCOUNTER — Telehealth: Payer: Self-pay | Admitting: Physical Medicine and Rehabilitation

## 2023-09-30 NOTE — Telephone Encounter (Signed)
 Patient left voice mail asking for refill on Rosuvastatin.

## 2023-10-01 ENCOUNTER — Other Ambulatory Visit: Payer: Self-pay | Admitting: Physical Medicine and Rehabilitation

## 2023-10-01 MED ORDER — ROSUVASTATIN CALCIUM 10 MG PO TABS: 10.0000 mg | ORAL_TABLET | Freq: Every day | ORAL | 3 refills | Status: AC

## 2023-10-22 ENCOUNTER — Other Ambulatory Visit (HOSPITAL_COMMUNITY): Payer: Self-pay | Admitting: Internal Medicine

## 2023-10-22 DIAGNOSIS — Z1231 Encounter for screening mammogram for malignant neoplasm of breast: Secondary | ICD-10-CM

## 2024-08-23 ENCOUNTER — Ambulatory Visit: Payer: BC Managed Care – PPO | Admitting: Physical Medicine and Rehabilitation
